# Patient Record
Sex: Female | Born: 1990
Health system: Southern US, Community
[De-identification: ages and names within clinical notes are randomized; demographics above are authoritative.]

## PROBLEM LIST (undated history)

## (undated) ENCOUNTER — Inpatient Hospital Stay: Payer: Self-pay

## (undated) DIAGNOSIS — J45909 Unspecified asthma, uncomplicated: Secondary | ICD-10-CM

## (undated) DIAGNOSIS — K811 Chronic cholecystitis: Secondary | ICD-10-CM

## (undated) DIAGNOSIS — Z8489 Family history of other specified conditions: Secondary | ICD-10-CM

## (undated) DIAGNOSIS — K219 Gastro-esophageal reflux disease without esophagitis: Secondary | ICD-10-CM

## (undated) DIAGNOSIS — E669 Obesity, unspecified: Secondary | ICD-10-CM

## (undated) DIAGNOSIS — B009 Herpesviral infection, unspecified: Secondary | ICD-10-CM

## (undated) DIAGNOSIS — O99345 Other mental disorders complicating the puerperium: Secondary | ICD-10-CM

## (undated) DIAGNOSIS — I10 Essential (primary) hypertension: Secondary | ICD-10-CM

## (undated) DIAGNOSIS — F53 Postpartum depression: Secondary | ICD-10-CM

## (undated) DIAGNOSIS — Z87442 Personal history of urinary calculi: Secondary | ICD-10-CM

## (undated) DIAGNOSIS — R011 Cardiac murmur, unspecified: Secondary | ICD-10-CM

## (undated) DIAGNOSIS — A6000 Herpesviral infection of urogenital system, unspecified: Secondary | ICD-10-CM

## (undated) DIAGNOSIS — F32A Depression, unspecified: Secondary | ICD-10-CM

## (undated) DIAGNOSIS — I499 Cardiac arrhythmia, unspecified: Secondary | ICD-10-CM

## (undated) DIAGNOSIS — K589 Irritable bowel syndrome without diarrhea: Secondary | ICD-10-CM

## (undated) DIAGNOSIS — F329 Major depressive disorder, single episode, unspecified: Secondary | ICD-10-CM

## (undated) DIAGNOSIS — E785 Hyperlipidemia, unspecified: Secondary | ICD-10-CM

## (undated) DIAGNOSIS — F419 Anxiety disorder, unspecified: Secondary | ICD-10-CM

## (undated) HISTORY — DX: Irritable bowel syndrome, unspecified: K58.9

## (undated) HISTORY — DX: Obesity, unspecified: E66.9

## (undated) HISTORY — DX: Depression, unspecified: F32.A

## (undated) HISTORY — PX: WISDOM TOOTH EXTRACTION: SHX21

## (undated) HISTORY — DX: Chronic cholecystitis: K81.1

## (undated) HISTORY — DX: Essential (primary) hypertension: I10

## (undated) HISTORY — DX: Unspecified asthma, uncomplicated: J45.909

## (undated) HISTORY — DX: Other mental disorders complicating the puerperium: O99.345

## (undated) HISTORY — DX: Postpartum depression: F53.0

## (undated) HISTORY — DX: Anxiety disorder, unspecified: F41.9

## (undated) HISTORY — DX: Cardiac murmur, unspecified: R01.1

## (undated) HISTORY — PX: OTHER SURGICAL HISTORY: SHX169

## (undated) HISTORY — DX: Herpesviral infection of urogenital system, unspecified: A60.00

## (undated) HISTORY — DX: Hyperlipidemia, unspecified: E78.5

## (undated) HISTORY — DX: Major depressive disorder, single episode, unspecified: F32.9

---

## 2014-12-21 DIAGNOSIS — O1410 Severe pre-eclampsia, unspecified trimester: Secondary | ICD-10-CM

## 2014-12-21 DIAGNOSIS — A6000 Herpesviral infection of urogenital system, unspecified: Secondary | ICD-10-CM

## 2016-07-08 ENCOUNTER — Ambulatory Visit: Payer: Self-pay | Admitting: Family Medicine

## 2016-09-26 ENCOUNTER — Telehealth: Payer: Self-pay | Admitting: Family Medicine

## 2016-09-26 NOTE — Telephone Encounter (Signed)
Pt husband Laban EmperorDarrell called and stated that they just found out that she is pregnant and had a couple of questions regarding her Celexa and whether she needs to stop this medication. Please advise, thank you!  Call pt @ [phone]

## 2016-09-26 NOTE — Telephone Encounter (Signed)
Legally Dr.Cook can not give medical advise because pt is not his pt. Pt will need to contact a OBGYN

## 2016-12-03 ENCOUNTER — Emergency Department
Admission: EM | Admit: 2016-12-03 | Discharge: 2016-12-03 | Disposition: A | Discharge status: Home / Self Care | Payer: BLUE CROSS/BLUE SHIELD | Attending: Emergency Medicine | Admitting: Emergency Medicine

## 2016-12-03 ENCOUNTER — Encounter: Payer: Self-pay | Admitting: Emergency Medicine

## 2016-12-03 DIAGNOSIS — O99511 Diseases of the respiratory system complicating pregnancy, first trimester: Secondary | ICD-10-CM | POA: Insufficient documentation

## 2016-12-03 DIAGNOSIS — Z3A13 13 weeks gestation of pregnancy: Secondary | ICD-10-CM | POA: Insufficient documentation

## 2016-12-03 DIAGNOSIS — J069 Acute upper respiratory infection, unspecified: Secondary | ICD-10-CM | POA: Insufficient documentation

## 2016-12-03 DIAGNOSIS — O210 Mild hyperemesis gravidarum: Secondary | ICD-10-CM | POA: Insufficient documentation

## 2016-12-03 DIAGNOSIS — O219 Vomiting of pregnancy, unspecified: Secondary | ICD-10-CM | POA: Diagnosis present

## 2016-12-03 HISTORY — DX: Herpesviral infection, unspecified: B00.9

## 2016-12-03 LAB — CBC WITH DIFFERENTIAL/PLATELET
Basophils Absolute: 0 10*3/uL (ref 0–0.1)
Basophils Relative: 0 %
EOS PCT: 0 %
Eosinophils Absolute: 0 10*3/uL (ref 0–0.7)
HEMATOCRIT: 38.3 % (ref 35.0–47.0)
Hemoglobin: 13.6 g/dL (ref 12.0–16.0)
Lymphocytes Relative: 30 %
Lymphs Abs: 2.6 10*3/uL (ref 1.0–3.6)
MCH: 32 pg (ref 26.0–34.0)
MCHC: 35.5 g/dL (ref 32.0–36.0)
MCV: 90.1 fL (ref 80.0–100.0)
MONO ABS: 0.4 10*3/uL (ref 0.2–0.9)
MONOS PCT: 5 %
NEUTROS ABS: 5.6 10*3/uL (ref 1.4–6.5)
Neutrophils Relative %: 65 %
Platelets: 345 10*3/uL (ref 150–440)
RBC: 4.25 MIL/uL (ref 3.80–5.20)
RDW: 13 % (ref 11.5–14.5)
WBC: 8.7 10*3/uL (ref 3.6–11.0)

## 2016-12-03 LAB — BASIC METABOLIC PANEL
Anion gap: 9 (ref 5–15)
BUN: 6 mg/dL (ref 6–20)
CO2: 22 mmol/L (ref 22–32)
Calcium: 8.9 mg/dL (ref 8.9–10.3)
Chloride: 102 mmol/L (ref 101–111)
Creatinine, Ser: 0.48 mg/dL (ref 0.44–1.00)
GFR calc Af Amer: >60 mL/min (ref >60)
GFR calc non Af Amer: >60 mL/min (ref >60)
Glucose, Bld: 97 mg/dL (ref 65–99)
Potassium: 3.2 mmol/L — ABNORMAL LOW (ref 3.5–5.1)
Sodium: 133 mmol/L — ABNORMAL LOW (ref 135–145)

## 2016-12-03 LAB — URINALYSIS, COMPLETE (UACMP) WITH MICROSCOPIC
BILIRUBIN URINE: NEGATIVE
GLUCOSE, UA: NEGATIVE mg/dL
HGB URINE DIPSTICK: NEGATIVE
KETONES UR: NEGATIVE mg/dL
LEUKOCYTES UA: NEGATIVE
NITRITE: NEGATIVE
PH: 9 — AB (ref 5.0–8.0)
Protein, ur: NEGATIVE mg/dL
Specific Gravity, Urine: 1.01 (ref 1.005–1.030)

## 2016-12-03 MED ORDER — METOCLOPRAMIDE HCL 5 MG/ML IJ SOLN
10.0000 mg | Freq: Once | INTRAMUSCULAR | Status: AC
  Administered 2016-12-03: 10 mg via INTRAVENOUS
  Filled 2016-12-03: qty 2

## 2016-12-03 MED ORDER — SODIUM CHLORIDE 0.9 % IV BOLUS (SEPSIS)
1000.0000 mL | Freq: Once | INTRAVENOUS | Status: AC
  Administered 2016-12-03: 1000 mL via INTRAVENOUS

## 2016-12-03 MED ORDER — ALBUTEROL SULFATE (2.5 MG/3ML) 0.083% IN NEBU
INHALATION_SOLUTION | RESPIRATORY_TRACT | Status: AC
  Administered 2016-12-03: 2.5 mg via RESPIRATORY_TRACT
  Filled 2016-12-03: qty 3

## 2016-12-03 MED ORDER — ALBUTEROL SULFATE (2.5 MG/3ML) 0.083% IN NEBU
2.5000 mg | INHALATION_SOLUTION | Freq: Once | RESPIRATORY_TRACT | Status: AC
  Administered 2016-12-03: 2.5 mg via RESPIRATORY_TRACT

## 2016-12-03 MED ORDER — GUAIFENESIN ER 600 MG PO TB12: 1200.0000 mg | ORAL_TABLET | Freq: Two times a day (BID) | ORAL | 0 refills | Status: DC

## 2016-12-03 MED ORDER — METOCLOPRAMIDE HCL 10 MG PO TABS: 10.0000 mg | ORAL_TABLET | Freq: Three times a day (TID) | ORAL | 0 refills | Status: DC

## 2016-12-03 MED ORDER — ONDANSETRON 4 MG PO TBDP
4.0000 mg | ORAL_TABLET | Freq: Once | ORAL | Status: AC
  Administered 2016-12-03: 4 mg via ORAL
  Filled 2016-12-03: qty 1

## 2016-12-03 MED ORDER — ALBUTEROL (5 MG/ML) CONTINUOUS INHALATION SOLN: 2.5000 mg/h | INHALATION_SOLUTION | Freq: Once | RESPIRATORY_TRACT | Status: DC

## 2016-12-03 NOTE — ED Notes (Signed)
Pt sitting up in room drinking ginger ale.  States nausea better.  Continued with headache.  NP aware.  Fluids infusing.

## 2016-12-03 NOTE — ED Notes (Signed)
Warm sheets to patient, resting in room, lights dimmed, call bell in reach

## 2016-12-03 NOTE — ED Notes (Signed)
Gave patient soft drink

## 2016-12-03 NOTE — ED Triage Notes (Signed)
Pt to ed with c/o cough and congestion x 3 weeks. Pt is approx [redacted] weeks pregnant.  OBGYN Dr Tiburcio PeaHarris.  Pt states she took a z pack about 2 weeks ago but no change in cough and congestion.

## 2016-12-03 NOTE — Discharge Instructions (Signed)
Follow-up with Dr. Tiburcio PeaHarris.  Return to the emergency department for symptoms that change or worsen if you are unable to schedule an appointment.

## 2016-12-03 NOTE — ED Provider Notes (Signed)
[facility] Emergency Department Provider Note  ____________________________________________  Time seen: Approximately 7:42 AM  I have reviewed the triage vital signs and the nursing notes.   HISTORY  Chief Complaint Cough   HPI Megan Kramer is a 25 y.o. female who presents to the emergency department for evaluation of cough and congestion. She states that she has had cough, congestion, and headache for the past 3 weeks. She also states that she has been nauseated since she found out she was pregnant. She states that this morning she has been vomiting. She was able to eat last night and did not vomit.She has taken Robitussin without relief of the cough.    Past Medical History:  Diagnosis Date  . Herpes     There are no active problems to display for this patient.   History reviewed. No pertinent surgical history.  Prior to Admission medications   Medication Sig Start Date End Date Taking? Authorizing Provider  guaiFENesin (MUCINEX) 600 MG 12 hr tablet Take 2 tablets (1,200 mg total) by mouth 2 (two) times daily. 12/03/16 12/03/17  Chinita Pesterari B Siddiq Kaluzny, FNP  metoCLOPramide (REGLAN) 10 MG tablet Take 1 tablet (10 mg total) by mouth 3 (three) times daily with meals. 12/03/16 12/03/17  Chinita Pesterari B Howell Groesbeck, FNP    Allergies Codeine  History reviewed. No pertinent family history.  Social History Social History  Substance Use Topics  . Smoking status: Never Smoker  . Smokeless tobacco: Never Used  . Alcohol use No    Review of Systems Constitutional: Negative for fever/chills ENT: Negative for sore throat. Cardiovascular: Denies chest pain. Respiratory: Negative for shortness of breath. Positive for cough. Gastrointestinal: Positive for nausea,  Positive for vomiting.  No diarrhea.  Musculoskeletal: Positive for body aches Skin: Negative for rash. Neurological: Positive for headaches ____________________________________________   PHYSICAL  EXAM:  VITAL SIGNS: ED Triage Vitals [12/03/16 0735]  Enc Vitals Group     BP (!) 143/83     Pulse Rate 86     Resp 20     Temp 98.3 F (36.8 C)     Temp Source Oral     SpO2 98 %     Weight 165 lb (74.8 kg)     Height 5\' 1"  (1.549 m)     Head Circumference      Peak Flow      Pain Score 7     Pain Loc      Pain Edu?      Excl. in GC?     Constitutional: Alert and oriented. Acutely ill appearing and in no acute distress. Eyes: Conjunctivae are normal. EOMI. Nose: Maxillary sinus congestion; clear rhinnorhea. Mouth/Throat: Mucous membranes are moist. Neck: No stridor.  Lymphatic: No cervical lymphadenopathy. Cardiovascular: Normal rate, regular rhythm. Grossly normal heart sounds.  Good peripheral circulation. Respiratory: Normal respiratory effort.  No retractions. Expiratory wheeze noted in the left lower lobe, otherwise clear. Gastrointestinal: Soft and nontender.  Musculoskeletal: FROM x 4 extremities.  Neurologic:  Normal speech and language.  Skin:  Skin is warm, dry and intact. No rash noted. Psychiatric: Mood and affect are normal. Speech and behavior are normal.  ____________________________________________   LABS (all labs ordered are listed, but only abnormal results are displayed)  Labs Reviewed  URINALYSIS, COMPLETE (UACMP) WITH MICROSCOPIC - Abnormal; Notable for the following:       Result Value   Color, Urine STRAW (*)    APPearance CLEAR (*)    pH 9.0 (*)  Bacteria, UA RARE (*)    Squamous Epithelial / LPF 0-5 (*)    All other components within normal limits  BASIC METABOLIC PANEL - Abnormal; Notable for the following:    Sodium 133 (*)    Potassium 3.2 (*)    All other components within normal limits  CBC WITH DIFFERENTIAL/PLATELET   ____________________________________________  EKG   ____________________________________________  RADIOLOGY   ____________________________________________   PROCEDURES  Procedure(s) performed:  None  Critical Care performed: No  ____________________________________________   INITIAL IMPRESSION / ASSESSMENT AND PLAN / ED COURSE  Clinical Course as of Dec 04 1043  Tue Dec 03, 2016  [id number] No improvement with ODT Zofran. Continues to complain of headache and nausea. Will order labs. No evidence of dehydration based on urinalysis results.  [CT]  1043 Nausea relieved by Reglan. Headache continues, but has improved. Patient able to tolerate po fluids without return of nausea or vomiting.  [CT]    Clinical Course User Index [CT] Chinita Pesterari B Ihsan Nomura, FNP    Pertinent labs & imaging results that were available during my care of the patient were reviewed by me and considered in my medical decision making (see chart for details).  Patient and husband verbalize concern for dehydration, however based on history, exam, and vital signs dehydration is unlikely. Urinalysis ordered for verification. Single dose of Zofran to be given in the emergency department as well as albuterol nebulized treatment. ____________________________________________   FINAL CLINICAL IMPRESSION(S) / ED DIAGNOSES  Final diagnoses:  Upper respiratory tract infection, unspecified type  Hyperemesis gravidarum, mild, before 23rd week    Note:  This document was prepared using Dragon voice recognition software and may include unintentional dictation errors.     Chinita PesterCari B Sagrario Lineberry, FNP 12/03/16 1232    Arnaldo NatalPaul F Malinda, MD 12/03/16 [phone]

## 2016-12-03 NOTE — ED Notes (Signed)
Pt informed to return if any life threatening symptoms occur.  

## 2016-12-30 ENCOUNTER — Emergency Department
Admission: EM | Admit: 2016-12-30 | Discharge: 2016-12-30 | Disposition: A | Discharge status: Home / Self Care | Payer: BLUE CROSS/BLUE SHIELD

## 2016-12-30 NOTE — ED Notes (Signed)
Patient states she called Dr. Tiburcio PeaHarris' office today to c/o one episode of bleeding and has not felt the baby moved in two days. Patient states she could not come to the office due to work, but was told to come to the ED and will then go upstairs to Labor and Delivery. Dr. Tiburcio PeaHarris was contacted by this writer and said that the patient could be seen at 1:15 tomorrow in the office or stay in the ED to be monitor. Patient will be informed.

## 2017-02-20 ENCOUNTER — Encounter: Payer: Self-pay | Admitting: Advanced Practice Midwife

## 2017-02-20 ENCOUNTER — Telehealth: Payer: Self-pay

## 2017-02-20 ENCOUNTER — Ambulatory Visit (INDEPENDENT_AMBULATORY_CARE_PROVIDER_SITE_OTHER): Payer: Managed Care, Other (non HMO) | Admitting: Advanced Practice Midwife

## 2017-02-20 VITALS — BP 134/76 | Wt 174.0 lb

## 2017-02-20 DIAGNOSIS — N390 Urinary tract infection, site not specified: Secondary | ICD-10-CM

## 2017-02-20 DIAGNOSIS — Z3482 Encounter for supervision of other normal pregnancy, second trimester: Secondary | ICD-10-CM

## 2017-02-20 DIAGNOSIS — O0990 Supervision of high risk pregnancy, unspecified, unspecified trimester: Secondary | ICD-10-CM | POA: Insufficient documentation

## 2017-02-20 LAB — POCT URINALYSIS DIPSTICK
Bilirubin, UA: NEGATIVE
Glucose, UA: NEGATIVE
Leukocytes, UA: NEGATIVE
NITRITE UA: NEGATIVE
PH UA: 7
SPEC GRAV UA: 1.02
UROBILINOGEN UA: NEGATIVE

## 2017-02-20 NOTE — Telephone Encounter (Signed)
Pt called stating she is 24wks preg.  Having strong menstrual cramps/pressure.  She is on her feet all at work.  She went home and rested and drank water.  It eased up and whent to sleep.  Sxs are back this morning.  Appt made for 1:50 c JEG.

## 2017-02-20 NOTE — Progress Notes (Signed)
Low stomach pain/ tightness/pressure x1 day.Feeling heavy chested

## 2017-02-20 NOTE — Progress Notes (Signed)
Pt has c/o stabbing pain in lower abdomen, central and spreading to sides, that began last night. She states that after she increased her hydration the pain improved and she was able to sleep. She also has c/o of pressure in her lower abdomen. She had some braxton hicks like contractions last night that lasted approximately 1 hour and improved after hydration. Large ketones in today's Urine dip. Recommend increase hydration throughout pregnancy. Other comfort measures reviewed. She has regular ob f/u visit scheduled for 28 weeks.

## 2017-03-14 ENCOUNTER — Ambulatory Visit (INDEPENDENT_AMBULATORY_CARE_PROVIDER_SITE_OTHER): Payer: Managed Care, Other (non HMO) | Admitting: Certified Nurse Midwife

## 2017-03-14 ENCOUNTER — Other Ambulatory Visit: Payer: Managed Care, Other (non HMO)

## 2017-03-14 ENCOUNTER — Encounter: Payer: Self-pay | Admitting: Certified Nurse Midwife

## 2017-03-14 ENCOUNTER — Telehealth: Payer: Self-pay | Admitting: Obstetrics & Gynecology

## 2017-03-14 VITALS — BP 122/68 | Wt 179.0 lb

## 2017-03-14 DIAGNOSIS — O0990 Supervision of high risk pregnancy, unspecified, unspecified trimester: Secondary | ICD-10-CM

## 2017-03-14 DIAGNOSIS — Z3A28 28 weeks gestation of pregnancy: Secondary | ICD-10-CM

## 2017-03-14 DIAGNOSIS — Z113 Encounter for screening for infections with a predominantly sexual mode of transmission: Secondary | ICD-10-CM

## 2017-03-14 DIAGNOSIS — O34219 Maternal care for unspecified type scar from previous cesarean delivery: Secondary | ICD-10-CM

## 2017-03-14 DIAGNOSIS — F53 Postpartum depression: Secondary | ICD-10-CM | POA: Insufficient documentation

## 2017-03-14 DIAGNOSIS — O99345 Other mental disorders complicating the puerperium: Secondary | ICD-10-CM

## 2017-03-14 DIAGNOSIS — F419 Anxiety disorder, unspecified: Secondary | ICD-10-CM | POA: Insufficient documentation

## 2017-03-14 DIAGNOSIS — Z98891 History of uterine scar from previous surgery: Secondary | ICD-10-CM | POA: Insufficient documentation

## 2017-03-14 DIAGNOSIS — A6000 Herpesviral infection of urogenital system, unspecified: Secondary | ICD-10-CM | POA: Insufficient documentation

## 2017-03-14 DIAGNOSIS — Z131 Encounter for screening for diabetes mellitus: Secondary | ICD-10-CM

## 2017-03-14 DIAGNOSIS — Z139 Encounter for screening, unspecified: Secondary | ICD-10-CM

## 2017-03-14 NOTE — Telephone Encounter (Signed)
-----   Message from Farrel Connersolleen Gutierrez, [place] sent at 03/14/2017  1:22 PM EDT ----- Regarding: scheduling surgery Surgery Booking Request Patient Full Name: Megan AbbeBrooke Renegar MRN: [id number]  DOB: Nov 18, 1991  Surgeon: Dr Velora Mediateobert Harris Requested Surgery Date and Time: 06/05/2017 Primary Diagnosis and Code: Previous Cesarean section Secondary Diagnosis and Code:  Surgical Procedure: Cesarean Section L&D Notification: Yes Admission Status: surgery admit Length of Surgery: 1 hour Special Case Needs: no H&P: to be scheduled Phone Interview or Office Pre-Admit: office Interpreter: no Language: no Medical Clearance: no Special Scheduling Instructions: no

## 2017-03-14 NOTE — Progress Notes (Signed)
Leaking when on feet a lot/28 week labs today

## 2017-03-14 NOTE — Progress Notes (Signed)
Baby active. Has been leaking urine at work (smells like ammonia). Discussed doing Kegel exercises. Cord blood donation discussed.  Baby boy/bottle Repeat CS scheduled for 6/14 with Dr Tiburcio Pea. 28 week labs today ROB in 2 weeks

## 2017-03-14 NOTE — Telephone Encounter (Signed)
Patient is aware of H&P on 06/03/17 @ 9:00am, Pre-Admit on 06/04/17, and OR on 06/05/17. Patient was given my extension.

## 2017-03-15 LAB — 28 WEEK RH+PANEL
BASOS ABS: 0 10*3/uL (ref 0.0–0.2)
Basos: 0 %
EOS (ABSOLUTE): 0 10*3/uL (ref 0.0–0.4)
EOS: 0 %
GESTATIONAL DIABETES SCREEN: 113 mg/dL (ref 65–139)
HIV Screen 4th Generation wRfx: NONREACTIVE
Hematocrit: 34.7 % (ref 34.0–46.6)
Hemoglobin: 12 g/dL (ref 11.1–15.9)
IMMATURE GRANS (ABS): 0 10*3/uL (ref 0.0–0.1)
IMMATURE GRANULOCYTES: 1 %
LYMPHS: 25 %
Lymphocytes Absolute: 1.8 10*3/uL (ref 0.7–3.1)
MCH: 30.7 pg (ref 26.6–33.0)
MCHC: 34.6 g/dL (ref 31.5–35.7)
MCV: 89 fL (ref 79–97)
Monocytes Absolute: 0.4 10*3/uL (ref 0.1–0.9)
Monocytes: 6 %
NEUTROS PCT: 68 %
Neutrophils Absolute: 5.1 10*3/uL (ref 1.4–7.0)
PLATELETS: 307 10*3/uL (ref 150–379)
RBC: 3.91 x10E6/uL (ref 3.77–5.28)
RDW: 13.5 % (ref 12.3–15.4)
RPR: NONREACTIVE
WBC: 7.3 10*3/uL (ref 3.4–10.8)

## 2017-03-23 ENCOUNTER — Encounter: Payer: Self-pay | Admitting: Certified Nurse Midwife

## 2017-03-28 ENCOUNTER — Ambulatory Visit (INDEPENDENT_AMBULATORY_CARE_PROVIDER_SITE_OTHER): Payer: Managed Care, Other (non HMO) | Admitting: Certified Nurse Midwife

## 2017-03-28 VITALS — BP 110/70 | Wt 177.0 lb

## 2017-03-28 DIAGNOSIS — Z3A30 30 weeks gestation of pregnancy: Secondary | ICD-10-CM

## 2017-03-28 DIAGNOSIS — O0990 Supervision of high risk pregnancy, unspecified, unspecified trimester: Secondary | ICD-10-CM

## 2017-03-28 NOTE — Progress Notes (Signed)
Out of TDAP.  Pt asked about the flu shot.  States she felt horrible after getting the TDAP/FLU at the same time last preg.  Not sure about getting the flu shot - not sure exactly what caused her reaction. Complains of episodes of regular contractions. Some increased discharge yesterdau. No vaginitis or UTI symptoms. Baby active. Cervix closed/ thick/ baby OOP 28 week labs WNL ROB in 2 weeks Preterm labor precautions

## 2017-04-08 ENCOUNTER — Telehealth: Payer: Self-pay | Admitting: Obstetrics and Gynecology

## 2017-04-08 ENCOUNTER — Observation Stay
Admission: EM | Admit: 2017-04-08 | Discharge: 2017-04-08 | Disposition: A | Discharge status: Home / Self Care | Payer: 59 | Attending: Obstetrics & Gynecology | Admitting: Obstetrics & Gynecology

## 2017-04-08 DIAGNOSIS — R109 Unspecified abdominal pain: Secondary | ICD-10-CM

## 2017-04-08 DIAGNOSIS — O0990 Supervision of high risk pregnancy, unspecified, unspecified trimester: Secondary | ICD-10-CM

## 2017-04-08 DIAGNOSIS — Z3A31 31 weeks gestation of pregnancy: Secondary | ICD-10-CM

## 2017-04-08 DIAGNOSIS — O26893 Other specified pregnancy related conditions, third trimester: Secondary | ICD-10-CM

## 2017-04-08 DIAGNOSIS — Z91040 Latex allergy status: Secondary | ICD-10-CM | POA: Diagnosis not present

## 2017-04-08 NOTE — OB Triage Note (Signed)
Pt G2P1 [redacted]w[redacted]d complains of pelvis pressure for a week. Pt states that it is painful and feels like stretching/pressure. Pt states she has had cramping since yesterday. Pt states pain for cramping is 8/10. Pt states she had a sharp pain in the R side of her lower abdomen yesterday and the pain radiated towards the left lower quadrant of her abdomen. States that cramping started after this pain. Pt states + FM and denies gush of fluid. Pt states she leaks every now and then. Monitors applied and assessing. VSS.

## 2017-04-08 NOTE — Telephone Encounter (Signed)
Pt is calling wanting to be seen today. Pt report last night she started vomiting and was having a stabbing pain in her abdomen with the sharpness going through to back side. Pt reports cramping and slight leaking but not enough to soak underwear. Please advise.

## 2017-04-08 NOTE — Discharge Summary (Signed)
Physician Final Progress Note  Patient ID: Megan Kramer MRN: [id number] DOB/AGE: 07-12-1991 26 y.o.  Admit date: 04/08/2017 Admitting provider: Tresea Mall, CNM Discharge date: 04/08/2017   Admission Diagnoses: lower abdominal pain/cramping, leakage of fluid  Discharge Diagnoses:  Active Problems:   Indication for care in labor and delivery, antepartum IUP at [redacted]w[redacted]d with reactive NST, not in labor, membranes intact, positive discomforts of pregnancy  History of Present Illness: The patient is a 26 y.o. female G2P1001 at [redacted]w[redacted]d who presents for lower abdominal pain and cramping since last night. The patient states the cramps feel like a period. She denies intermittent nature of cramps or vaginal bleeding. The patient states that she has felt small amounts of fluid leaking since yesterday. She admits positive fetal movement.    Past Medical History:  Diagnosis Date  . Anxiety   . Herpes   . Hypertension    pregnancy induced  . Obesity (BMI 30-39.9)   . Postpartum depression associated with first pregnancy   . Second pregnancy     Past Surgical History:  Procedure Laterality Date  . CESAREAN SECTION  12/21/2014   [place]    No current facility-administered medications on file prior to encounter.    Current Outpatient Prescriptions on File Prior to Encounter  Medication Sig Dispense Refill  . Prenatal Vit-Fe Fumarate-FA (PRENATAL MULTIVITAMIN) TABS tablet Take 1 tablet by mouth daily at 12 noon.    . Doxylamine-Pyridoxine (DICLEGIS) 10-10 MG TBEC Take 10 tablets by mouth 3 (three) times daily. Take one tablet by oral route once daily in the morning, one tablet in the mid-afternoon, and two tablets at bedtime.    Marland Kitchen guaiFENesin (MUCINEX) 600 MG 12 hr tablet Take 2 tablets (1,200 mg total) by mouth 2 (two) times daily. (Patient not taking: Reported on 04/08/2017) 60 tablet 0  . hydrOXYzine (VISTARIL) 25 MG capsule Take 25 mg by mouth 3 (three) times daily as needed.    .  metoCLOPramide (REGLAN) 10 MG tablet Take 1 tablet (10 mg total) by mouth 3 (three) times daily with meals. (Patient not taking: Reported on 04/08/2017) 30 tablet 0    Allergies  Allergen Reactions  . Codeine Nausea Only  . Latex Rash    Social History   Social History  . Marital status: Married    Spouse name: N/A  . Number of children: N/A  . Years of education: N/A   Occupational History  . Not on file.   Social History Main Topics  . Smoking status: Never Smoker  . Smokeless tobacco: Never Used  . Alcohol use No  . Drug use: No  . Sexual activity: Yes    Birth control/ protection: None   Other Topics Concern  . Not on file   Social History Narrative  . No narrative on file    Physical Exam: BP 126/74 (BP Location: Right Arm)   Pulse 99   Temp 98 F (36.7 C) (Oral)   Resp 16   Ht  (1.549 m)   Wt 177 lb (80.3 kg)   BMI 33.44 kg/m   Gen: NAD CV: RRR Pulm: CTAB Pelvic: speculum exam cervix is closed Toco: negative Fetal well being: 135 bpm, moderate variability, +accels, -decels Membranes: nitrazine negative, pooling negative, ferning negative Ext: no swelling, no evidence of DVT  Consults: None  Significant Findings/ Diagnostic Studies: labs: none  Procedures: NST  Discharge Condition: good  Disposition: 01-Home or Self Care  Diet: Regular diet  Discharge Activity: Activity as  tolerated  Discharge Instructions    Discharge activity:    Complete by:  As directed    Out of work and increased rest for the remainder of this week with reevaluation of when to be out of work prior to delivery. Out of work note given today   Discharge diet:  No restrictions    Complete by:  As directed    Fetal Kick Count:  Lie on our left side for one hour after a meal, and count the number of times your baby kicks.  If it is less than 5 times, get up, move around and drink some juice.  Repeat the test 30 minutes later.  If it is still less than 5 kicks in an  hour, notify your doctor.    Complete by:  As directed    No sexual activity restrictions    Complete by:  As directed    Notify physician for a general feeling that "something is not right"    Complete by:  As directed    Notify physician for increase or change in vaginal discharge    Complete by:  As directed    Notify physician for intestinal cramps, with or without diarrhea, sometimes described as "gas pain"    Complete by:  As directed    Notify physician for leaking of fluid    Complete by:  As directed    Notify physician for low, dull backache, unrelieved by heat or Tylenol    Complete by:  As directed    Notify physician for menstrual like cramps    Complete by:  As directed    Notify physician for pelvic pressure    Complete by:  As directed    Notify physician for uterine contractions.  These may be painless and feel like the uterus is tightening or the baby is  "balling up"    Complete by:  As directed    Notify physician for vaginal bleeding    Complete by:  As directed    PRETERM LABOR:  Includes any of the follwing symptoms that occur between 20 - [redacted] weeks gestation.  If these symptoms are not stopped, preterm labor can result in preterm delivery, placing your baby at risk    Complete by:  As directed      Allergies as of 04/08/2017      Reactions   Codeine Nausea Only   Latex Rash      Medication List    STOP taking these medications   guaiFENesin 600 MG 12 hr tablet Commonly known as:  MUCINEX   metoCLOPramide 10 MG tablet Commonly known as:  REGLAN     TAKE these medications   DICLEGIS 10-10 MG Tbec Generic drug:  Doxylamine-Pyridoxine Take 10 tablets by mouth 3 (three) times daily. Take one tablet by oral route once daily in the morning, one tablet in the mid-afternoon, and two tablets at bedtime.   hydrOXYzine 25 MG capsule Commonly known as:  VISTARIL Take 25 mg by mouth 3 (three) times daily as needed.   prenatal multivitamin Tabs tablet Take 1  tablet by mouth daily at 12 noon.      Follow-up Information    [facility] Follow up.   Why:  go to regular scheduled prenatal appointment Contact information: [address] [place] [address] [phone]          Total time spent taking care of this patient: 25 minutes  Signed: Tresea Mall, CNM  04/08/2017, 12:05  PM

## 2017-04-08 NOTE — Telephone Encounter (Signed)
Pt also left msg on triage line. Pt reports "violently" getting sick last night then experiencing a sharp pain in her pelvic area around to her back and cramping. Pt also reports baby's movement has decreased. Checked with front desk and no availability on schedule today. Pt was currently at work but plans to go to L&D for monitoring.

## 2017-04-10 ENCOUNTER — Observation Stay
Admission: EM | Admit: 2017-04-10 | Discharge: 2017-04-10 | Disposition: A | Discharge status: Home / Self Care | Payer: 59 | Attending: Certified Nurse Midwife | Admitting: Certified Nurse Midwife

## 2017-04-10 ENCOUNTER — Telehealth: Payer: Self-pay

## 2017-04-10 DIAGNOSIS — Z3A31 31 weeks gestation of pregnancy: Secondary | ICD-10-CM | POA: Diagnosis not present

## 2017-04-10 DIAGNOSIS — O26893 Other specified pregnancy related conditions, third trimester: Principal | ICD-10-CM | POA: Insufficient documentation

## 2017-04-10 DIAGNOSIS — R109 Unspecified abdominal pain: Secondary | ICD-10-CM | POA: Diagnosis not present

## 2017-04-10 DIAGNOSIS — O26899 Other specified pregnancy related conditions, unspecified trimester: Secondary | ICD-10-CM | POA: Diagnosis present

## 2017-04-10 DIAGNOSIS — O0990 Supervision of high risk pregnancy, unspecified, unspecified trimester: Secondary | ICD-10-CM

## 2017-04-10 LAB — URINALYSIS, COMPLETE (UACMP) WITH MICROSCOPIC
Bilirubin Urine: NEGATIVE
Glucose, UA: NEGATIVE mg/dL
Ketones, ur: NEGATIVE mg/dL
Leukocytes, UA: NEGATIVE
Nitrite: NEGATIVE
Protein, ur: NEGATIVE mg/dL
Specific Gravity, Urine: 1.005 (ref 1.005–1.030)
pH: 7 (ref 5.0–8.0)

## 2017-04-10 MED ORDER — ACETAMINOPHEN 500 MG PO TABS
1000.0000 mg | ORAL_TABLET | Freq: Four times a day (QID) | ORAL | Status: DC | PRN
  Administered 2017-04-10: 1000 mg via ORAL
  Filled 2017-04-10: qty 2

## 2017-04-10 NOTE — OB Triage Note (Signed)
Patient presents to Labor and Delivery with complaints of lower abdominal cramping.  Patient denies vaginal bleeding, leaking fluid, or decreased fetal movement. EFM applied and explained Plan to monitor fetal and maternal well-being.

## 2017-04-10 NOTE — Telephone Encounter (Signed)
Pt calling.  She was seen in L&D Tues for cramping and was taken out of work for the rest of the week.  Today the cramping is much worse so much so that she is curled up on bed.  She has been resting since Tues, drinking water, trying to walk, took a hot bath - nothing is helping.  It is worse.  Adv pt to go back to L&D for possible monitoring to be sure she isn't having ctxs.  Pt aware to enter via ED.  Lori at L&D notified.

## 2017-04-10 NOTE — Final Progress Note (Signed)
Physician Final Progress Note  Patient ID: Megan Kramer MRN: [id number] DOB/AGE: 03/21/1991 26 y.o.  Admit date: 04/10/2017 Admitting provider: Vena Austria, MD Discharge date: 04/10/2017   Admission Diagnoses: Abdominal pain third trimester  Discharge Diagnoses:  Active Problems:   Abdominal cramping complicating pregnancy   Consults: None  Significant Findings/ Diagnostic Studies: Results for orders placed or performed during the hospital encounter of 04/10/17 (from the past 24 hour(s))  Urinalysis, Complete w Microscopic     Status: Abnormal   Collection Time: 04/10/17 12:40 PM  Result Value Ref Range   Color, Urine STRAW (A) YELLOW   APPearance CLEAR (A) CLEAR   Specific Gravity, Urine 1.005 1.005 - 1.030   pH 7.0 5.0 - 8.0   Glucose, UA NEGATIVE NEGATIVE mg/dL   Hgb urine dipstick MODERATE (A) NEGATIVE   Bilirubin Urine NEGATIVE NEGATIVE   Ketones, ur NEGATIVE NEGATIVE mg/dL   Protein, ur NEGATIVE NEGATIVE mg/dL   Nitrite NEGATIVE NEGATIVE   Leukocytes, UA NEGATIVE NEGATIVE   RBC / HPF 6-30 0 - 5 RBC/hpf   WBC, UA 0-5 0 - 5 WBC/hpf   Bacteria, UA FEW (A) NONE SEEN   Squamous Epithelial / LPF 0-5 (A) NONE SEEN   Mucous PRESENT     Procedures: NST Baseline: 125 Variability: moderate Accelerations: present Decelerations: absent Tocometry: none The patient was monitored for 30 minutes, fetal heart rate tracing was deemed reactive, category I tracing,   Discharge Condition: good  Disposition: 01-Home or Self Care  Diet: Regular diet  Discharge Activity: Bedrest  Discharge Instructions    Discharge activity:  No Restrictions    Complete by:  As directed    Discharge diet:  No restrictions    Complete by:  As directed    No sexual activity restrictions    Complete by:  As directed    Notify physician for a general feeling that "something is not right"    Complete by:  As directed    Notify physician for increase or change in vaginal discharge     Complete by:  As directed    Notify physician for intestinal cramps, with or without diarrhea, sometimes described as "gas pain"    Complete by:  As directed    Notify physician for leaking of fluid    Complete by:  As directed    Notify physician for low, dull backache, unrelieved by heat or Tylenol    Complete by:  As directed    Notify physician for menstrual like cramps    Complete by:  As directed    Notify physician for pelvic pressure    Complete by:  As directed    Notify physician for uterine contractions.  These may be painless and feel like the uterus is tightening or the baby is  "balling up"    Complete by:  As directed    Notify physician for vaginal bleeding    Complete by:  As directed    PRETERM LABOR:  Includes any of the follwing symptoms that occur between 20 - [redacted] weeks gestation.  If these symptoms are not stopped, preterm labor can result in preterm delivery, placing your baby at risk    Complete by:  As directed      Allergies as of 04/10/2017      Reactions   Codeine Nausea Only   Latex Rash      Medication List    TAKE these medications   DICLEGIS 10-10 MG Tbec Generic drug:  Doxylamine-Pyridoxine Take  10 tablets by mouth 3 (three) times daily. Take one tablet by oral route once daily in the morning, one tablet in the mid-afternoon, and two tablets at bedtime.   hydrOXYzine 25 MG capsule Commonly known as:  VISTARIL Take 25 mg by mouth 3 (three) times daily as needed.   prenatal multivitamin Tabs tablet Take 1 tablet by mouth daily at 12 noon.        Total time spent taking care of this patient: 20 minutes  Signed: Vena Austria 04/10/2017, 2:43 PM

## 2017-04-11 ENCOUNTER — Ambulatory Visit (INDEPENDENT_AMBULATORY_CARE_PROVIDER_SITE_OTHER): Payer: Managed Care, Other (non HMO) | Admitting: Advanced Practice Midwife

## 2017-04-11 VITALS — BP 132/74 | Wt 181.0 lb

## 2017-04-11 DIAGNOSIS — M549 Dorsalgia, unspecified: Secondary | ICD-10-CM

## 2017-04-11 DIAGNOSIS — Z3A32 32 weeks gestation of pregnancy: Secondary | ICD-10-CM

## 2017-04-11 DIAGNOSIS — O99891 Other specified diseases and conditions complicating pregnancy: Secondary | ICD-10-CM

## 2017-04-11 DIAGNOSIS — O9989 Other specified diseases and conditions complicating pregnancy, childbirth and the puerperium: Secondary | ICD-10-CM

## 2017-04-11 DIAGNOSIS — O26893 Other specified pregnancy related conditions, third trimester: Secondary | ICD-10-CM

## 2017-04-11 MED ORDER — CYCLOBENZAPRINE HCL 10 MG PO TABS: 10.0000 mg | ORAL_TABLET | Freq: Three times a day (TID) | ORAL | 2 refills | Status: DC | PRN

## 2017-04-11 NOTE — Progress Notes (Signed)
Hip area pain/cramping/spotting on toilet tissue

## 2017-04-11 NOTE — Progress Notes (Signed)
Pain has been increasing in back and hips since yesterday, some cramping and spotting following recent cervical exams. Cervix was closed yesterday at hospital. Recommendations given for rest, stretching, wearing abdominal support binder, hydrotherapy. Rx given for flexeril due to back pain. Work note given for out of work beginning now. May reevaluate after wearing abdominal support and other comfort measures. Pt also requested print out of prenatal labs for Hep B result. Baby breech on u/s yesterday and continues in breech today.

## 2017-04-11 NOTE — Patient Instructions (Signed)
Third Trimester of Pregnancy The third trimester is from week 28 through week 40 (months 7 through 9). The third trimester is a time when the unborn baby (fetus) is growing rapidly. At the end of the ninth month, the fetus is about 20 inches in length and weighs 6-10 pounds. Body changes during your third trimester Your body will continue to go through many changes during pregnancy. The changes vary from woman to woman. During the third trimester:  Your weight will continue to increase. You can expect to gain 25-35 pounds (11-16 kg) by the end of the pregnancy.  You may begin to get stretch marks on your hips, abdomen, and breasts.  You may urinate more often because the fetus is moving lower into your pelvis and pressing on your bladder.  You may develop or continue to have heartburn. This is caused by increased hormones that slow down muscles in the digestive tract.  You may develop or continue to have constipation because increased hormones slow digestion and cause the muscles that push waste through your intestines to relax.  You may develop hemorrhoids. These are swollen veins (varicose veins) in the rectum that can itch or be painful.  You may develop swollen, bulging veins (varicose veins) in your legs.  You may have increased body aches in the pelvis, back, or thighs. This is due to weight gain and increased hormones that are relaxing your joints.  You may have changes in your hair. These can include thickening of your hair, rapid growth, and changes in texture. Some women also have hair loss during or after pregnancy, or hair that feels dry or thin. Your hair will most likely return to normal after your baby is born.  Your breasts will continue to grow and they will continue to become tender. A yellow fluid (colostrum) may leak from your breasts. This is the first milk you are producing for your baby.  Your belly button may stick out.  You may notice more swelling in your hands,  face, or ankles.  You may have increased tingling or numbness in your hands, arms, and legs. The skin on your belly may also feel numb.  You may feel short of breath because of your expanding uterus.  You may have more problems sleeping. This can be caused by the size of your belly, increased need to urinate, and an increase in your body's metabolism.  You may notice the fetus "dropping," or moving lower in your abdomen (lightening).  You may have increased vaginal discharge.  You may notice your joints feel loose and you may have pain around your pelvic bone.  What to expect at prenatal visits You will have prenatal exams every 2 weeks until week 36. Then you will have weekly prenatal exams. During a routine prenatal visit:  You will be weighed to make sure you and the baby are growing normally.  Your blood pressure will be taken.  Your abdomen will be measured to track your baby's growth.  The fetal heartbeat will be listened to.  Any test results from the previous visit will be discussed.  You may have a cervical check near your due date to see if your cervix has softened or thinned (effaced).  You will be tested for Group B streptococcus. This happens between 35 and 37 weeks.  Your health care provider may ask you:  What your birth plan is.  How you are feeling.  If you are feeling the baby move.  If you have had   any abnormal symptoms, such as leaking fluid, bleeding, severe headaches, or abdominal cramping.  If you are using any tobacco products, including cigarettes, chewing tobacco, and electronic cigarettes.  If you have any questions.  Other tests or screenings that may be performed during your third trimester include:  Blood tests that check for low iron levels (anemia).  Fetal testing to check the health, activity level, and growth of the fetus. Testing is done if you have certain medical conditions or if there are problems during the  pregnancy.  Nonstress test (NST). This test checks the health of your baby to make sure there are no signs of problems, such as the baby not getting enough oxygen. During this test, a belt is placed around your belly. The baby is made to move, and its heart rate is monitored during movement.  What is false labor? False labor is a condition in which you feel small, irregular tightenings of the muscles in the womb (contractions) that usually go away with rest, changing position, or drinking water. These are called Braxton Hicks contractions. Contractions may last for hours, days, or even weeks before true labor sets in. If contractions come at regular intervals, become more frequent, increase in intensity, or become painful, you should see your health care provider. What are the signs of labor?  Abdominal cramps.  Regular contractions that start at 10 minutes apart and become stronger and more frequent with time.  Contractions that start on the top of the uterus and spread down to the lower abdomen and back.  Increased pelvic pressure and dull back pain.  A watery or bloody mucus discharge that comes from the vagina.  Leaking of amniotic fluid. This is also known as your "water breaking." It could be a slow trickle or a gush. Let your health care provider know if it has a color or strange odor. If you have any of these signs, call your health care provider right away, even if it is before your due date. Follow these instructions at home: Medicines  Follow your health care provider's instructions regarding medicine use. Specific medicines may be either safe or unsafe to take during pregnancy.  Take a prenatal vitamin that contains at least 600 micrograms (mcg) of folic acid.  If you develop constipation, try taking a stool softener if your health care provider approves. Eating and drinking  Eat a balanced diet that includes fresh fruits and vegetables, whole grains, good sources of protein  such as meat, eggs, or tofu, and low-fat dairy. Your health care provider will help you determine the amount of weight gain that is right for you.  Avoid raw meat and uncooked cheese. These carry germs that can cause birth defects in the baby.  If you have low calcium intake from food, talk to your health care provider about whether you should take a daily calcium supplement.  Eat four or five small meals rather than three large meals a day.  Limit foods that are high in fat and processed sugars, such as fried and sweet foods.  To prevent constipation: ? Drink enough fluid to keep your urine clear or pale yellow. ? Eat foods that are high in fiber, such as fresh fruits and vegetables, whole grains, and beans. Activity  Exercise only as directed by your health care provider. Most women can continue their usual exercise routine during pregnancy. Try to exercise for 30 minutes at least 5 days a week. Stop exercising if you experience uterine contractions.  Avoid heavy   lifting.  Do not exercise in extreme heat or humidity, or at high altitudes.  Wear low-heel, comfortable shoes.  Practice good posture.  You may continue to have sex unless your health care provider tells you otherwise. Relieving pain and discomfort  Take frequent breaks and rest with your legs elevated if you have leg cramps or low back pain.  Take warm sitz baths to soothe any pain or discomfort caused by hemorrhoids. Use hemorrhoid cream if your health care provider approves.  Wear a good support bra to prevent discomfort from breast tenderness.  If you develop varicose veins: ? Wear support pantyhose or compression stockings as told by your healthcare provider. ? Elevate your feet for 15 minutes, 3-4 times a day. Prenatal care  Write down your questions. Take them to your prenatal visits.  Keep all your prenatal visits as told by your health care provider. This is important. Safety  Wear your seat belt at  all times when driving.  Make a list of emergency phone numbers, including numbers for family, friends, the hospital, and police and fire departments. General instructions  Avoid cat litter boxes and soil used by cats. These carry germs that can cause birth defects in the baby. If you have a cat, ask someone to clean the litter box for you.  Do not travel far distances unless it is absolutely necessary and only with the approval of your health care provider.  Do not use hot tubs, steam rooms, or saunas.  Do not drink alcohol.  Do not use any products that contain nicotine or tobacco, such as cigarettes and e-cigarettes. If you need help quitting, ask your health care provider.  Do not use any medicinal herbs or unprescribed drugs. These chemicals affect the formation and growth of the baby.  Do not douche or use tampons or scented sanitary pads.  Do not cross your legs for long periods of time.  To prepare for the arrival of your baby: ? Take prenatal classes to understand, practice, and ask questions about labor and delivery. ? Make a trial run to the hospital. ? Visit the hospital and tour the maternity area. ? Arrange for maternity or paternity leave through employers. ? Arrange for family and friends to take care of pets while you are in the hospital. ? Purchase a rear-facing car seat and make sure you know how to install it in your car. ? Pack your hospital bag. ? Prepare the baby's nursery. Make sure to remove all pillows and stuffed animals from the baby's crib to prevent suffocation.  Visit your dentist if you have not gone during your pregnancy. Use a soft toothbrush to brush your teeth and be gentle when you floss. Contact a health care provider if:  You are unsure if you are in labor or if your water has broken.  You become dizzy.  You have mild pelvic cramps, pelvic pressure, or nagging pain in your abdominal area.  You have lower back pain.  You have persistent  nausea, vomiting, or diarrhea.  You have an unusual or bad smelling vaginal discharge.  You have pain when you urinate. Get help right away if:  Your water breaks before 37 weeks.  You have regular contractions less than 5 minutes apart before 37 weeks.  You have a fever.  You are leaking fluid from your vagina.  You have spotting or bleeding from your vagina.  You have severe abdominal pain or cramping.  You have rapid weight loss or weight gain.    You have shortness of breath with chest pain.  You notice sudden or extreme swelling of your face, hands, ankles, feet, or legs.  Your baby makes fewer than 10 movements in 2 hours.  You have severe headaches that do not go away when you take medicine.  You have vision changes. Summary  The third trimester is from week 28 through week 40, months 7 through 9. The third trimester is a time when the unborn baby (fetus) is growing rapidly.  During the third trimester, your discomfort may increase as you and your baby continue to gain weight. You may have abdominal, leg, and back pain, sleeping problems, and an increased need to urinate.  During the third trimester your breasts will keep growing and they will continue to become tender. A yellow fluid (colostrum) may leak from your breasts. This is the first milk you are producing for your baby.  False labor is a condition in which you feel small, irregular tightenings of the muscles in the womb (contractions) that eventually go away. These are called Braxton Hicks contractions. Contractions may last for hours, days, or even weeks before true labor sets in.  Signs of labor can include: abdominal cramps; regular contractions that start at 10 minutes apart and become stronger and more frequent with time; watery or bloody mucus discharge that comes from the vagina; increased pelvic pressure and dull back pain; and leaking of amniotic fluid. This information is not intended to replace advice  given to you by your health care provider. Make sure you discuss any questions you have with your health care provider. Document Released: 12/03/2001 Document Revised: 05/16/2016 Document Reviewed: 02/09/2013 Elsevier Interactive Patient Education  2017 [employer]  

## 2017-04-15 ENCOUNTER — Observation Stay: Payer: 59

## 2017-04-15 ENCOUNTER — Telehealth: Payer: Self-pay

## 2017-04-15 ENCOUNTER — Observation Stay
Admission: EM | Admit: 2017-04-15 | Discharge: 2017-04-15 | Disposition: A | Discharge status: Home / Self Care | Payer: 59 | Attending: Obstetrics and Gynecology | Admitting: Obstetrics and Gynecology

## 2017-04-15 DIAGNOSIS — Z3A32 32 weeks gestation of pregnancy: Secondary | ICD-10-CM | POA: Insufficient documentation

## 2017-04-15 DIAGNOSIS — O9989 Other specified diseases and conditions complicating pregnancy, childbirth and the puerperium: Principal | ICD-10-CM | POA: Insufficient documentation

## 2017-04-15 DIAGNOSIS — Z91040 Latex allergy status: Secondary | ICD-10-CM | POA: Insufficient documentation

## 2017-04-15 DIAGNOSIS — Z885 Allergy status to narcotic agent status: Secondary | ICD-10-CM | POA: Insufficient documentation

## 2017-04-15 DIAGNOSIS — O139 Gestational [pregnancy-induced] hypertension without significant proteinuria, unspecified trimester: Secondary | ICD-10-CM | POA: Diagnosis not present

## 2017-04-15 DIAGNOSIS — Z79899 Other long term (current) drug therapy: Secondary | ICD-10-CM | POA: Insufficient documentation

## 2017-04-15 DIAGNOSIS — R109 Unspecified abdominal pain: Secondary | ICD-10-CM

## 2017-04-15 DIAGNOSIS — R1032 Left lower quadrant pain: Secondary | ICD-10-CM | POA: Diagnosis not present

## 2017-04-15 DIAGNOSIS — O0990 Supervision of high risk pregnancy, unspecified, unspecified trimester: Secondary | ICD-10-CM

## 2017-04-15 DIAGNOSIS — N132 Hydronephrosis with renal and ureteral calculous obstruction: Secondary | ICD-10-CM | POA: Insufficient documentation

## 2017-04-15 LAB — COMPREHENSIVE METABOLIC PANEL
ALT: 14 U/L (ref 14–54)
AST: 23 U/L (ref 15–41)
Albumin: 3 g/dL — ABNORMAL LOW (ref 3.5–5.0)
Alkaline Phosphatase: 137 U/L — ABNORMAL HIGH (ref 38–126)
Anion gap: 9 (ref 5–15)
BUN: 6 mg/dL (ref 6–20)
CHLORIDE: 106 mmol/L (ref 101–111)
CO2: 23 mmol/L (ref 22–32)
CREATININE: 0.54 mg/dL (ref 0.44–1.00)
Calcium: 9.4 mg/dL (ref 8.9–10.3)
GFR calc non Af Amer: >60 mL/min (ref >60)
Glucose, Bld: 74 mg/dL (ref 65–99)
POTASSIUM: 3.9 mmol/L (ref 3.5–5.1)
SODIUM: 138 mmol/L (ref 135–145)
Total Bilirubin: 0.6 mg/dL (ref 0.3–1.2)
Total Protein: 6.9 g/dL (ref 6.5–8.1)

## 2017-04-15 LAB — URINALYSIS, COMPLETE (UACMP) WITH MICROSCOPIC
BILIRUBIN URINE: NEGATIVE
Glucose, UA: NEGATIVE mg/dL
Hgb urine dipstick: NEGATIVE
KETONES UR: NEGATIVE mg/dL
LEUKOCYTES UA: NEGATIVE
Nitrite: NEGATIVE
Protein, ur: NEGATIVE mg/dL
Specific Gravity, Urine: 1.017 (ref 1.005–1.030)
pH: 6 (ref 5.0–8.0)

## 2017-04-15 LAB — CBC
HCT: 35.3 % (ref 35.0–47.0)
Hemoglobin: 12.3 g/dL (ref 12.0–16.0)
MCH: 30.8 pg (ref 26.0–34.0)
MCHC: 34.8 g/dL (ref 32.0–36.0)
MCV: 88.5 fL (ref 80.0–100.0)
PLATELETS: 296 10*3/uL (ref 150–440)
RBC: 3.99 MIL/uL (ref 3.80–5.20)
RDW: 13.8 % (ref 11.5–14.5)
WBC: 8.3 10*3/uL (ref 3.6–11.0)

## 2017-04-15 LAB — PROTEIN / CREATININE RATIO, URINE
Creatinine, Urine: 151 mg/dL
PROTEIN CREATININE RATIO: 0.11 mg/mg{creat} (ref 0.00–0.15)
TOTAL PROTEIN, URINE: 17 mg/dL

## 2017-04-15 LAB — LACTIC ACID, PLASMA: LACTIC ACID, VENOUS: 0.9 mmol/L (ref 0.5–1.9)

## 2017-04-15 LAB — LIPASE, BLOOD: LIPASE: 23 U/L (ref 11–51)

## 2017-04-15 NOTE — Discharge Summary (Signed)
Physician Discharge Summary  Patient ID: Megan Kramer MRN: [id number] DOB/AGE: 19-Dec-1991 26 y.o.  Admit date: 04/15/2017 Admitting provider: Conard Novak, MD Discharge date: 04/15/2017   Admission Diagnoses:  1) intrauterine pregnancy at [redacted]w[redacted]d 2) Left abdominal pain  Discharge Diagnoses:  1) intrauterine pregnancy at [redacted]w[redacted]d 2) Left abdominal pain 3) left hydronephrosis, left hydroureter (moderate)  History of Present Illness: The patient is a 26 y.o. female G2P1001 at [redacted]w[redacted]d who presents for left flank and back pain since this morning.  She H&P for details.  Hospital Course: The patient was admitted for the above.  She had feta assessment with fetal monitoring which was reassuring.  She had lab work to assess the source of her pain.  A renal ultrasound showed left hydronephrosis and left hydroureter without an obvious stone noted.  Urology consult was obtained to offer treatment options.  Patient will have expectant management for now.  She was offered percutaneous nephrostomy, if pain worsens or hydronephrosis/hydroureter worsens.  She had some contractions on the monitor her, which were described by the patient as cramps.  She has had these off and on for a couple weeks now with no cervical dilation noted on her visit last week. She declined a cervical check today.  Patient discharged with precautions given directly by Urologist, especially fever and worsening pain.  She is to report fever ASAP, should this occur.  Her vitals started with elevated BP, but they normalized after she was here for a while.  Baseline preeclampsia labs obtained were normal.  Will continue to monitor this at her clinic visits.  Precautions for preeclampsia also given. Patient discharged home in stable condition.    Past Medical History:  Diagnosis Date  . Anxiety   . Herpes   . Hypertension    pregnancy induced  . Obesity (BMI 30-39.9)   . Postpartum depression associated with first pregnancy   .  Second pregnancy     Past Surgical History:  Procedure Laterality Date  . CESAREAN SECTION  12/21/2014   [place]    No current facility-administered medications on file prior to encounter.    Current Outpatient Prescriptions on File Prior to Encounter  Medication Sig Dispense Refill  . cyclobenzaprine (FLEXERIL) 10 MG tablet Take 1 tablet (10 mg total) by mouth 3 (three) times daily as needed for muscle spasms. 30 tablet 2  . Prenatal Vit-Fe Fumarate-FA (PRENATAL MULTIVITAMIN) TABS tablet Take 1 tablet by mouth daily at 12 noon.    . Doxylamine-Pyridoxine (DICLEGIS) 10-10 MG TBEC Take 10 tablets by mouth 3 (three) times daily. Take one tablet by oral route once daily in the morning, one tablet in the mid-afternoon, and two tablets at bedtime.    . hydrOXYzine (VISTARIL) 25 MG capsule Take 25 mg by mouth 3 (three) times daily as needed.      Allergies  Allergen Reactions  . Codeine Nausea Only  . Latex Rash    Social History   Social History  . Marital status: Married    Spouse name: N/A  . Number of children: N/A  . Years of education: N/A   Occupational History  . Not on file.   Social History Main Topics  . Smoking status: Never Smoker  . Smokeless tobacco: Never Used  . Alcohol use No  . Drug use: No  . Sexual activity: Yes    Birth control/ protection: None   Other Topics Concern  . Not on file   Social History Narrative  . No  narrative on file    Physical Exam: BP 136/79 (BP Location: Left Arm)   Pulse (!) 124   Temp 98.5 F (36.9 C) (Oral)   Resp 16   Ht  (1.549 m)   Wt 181 lb (82.1 kg)   BMI 34.20 kg/m   Gen: NAD CV: RRR Pulm: CTAB Pelvic: deferred Ext: no e/c/t  Consults: urology  Significant Findings/ Diagnostic Studies: labs: normal (see EPIC) and  radiology:   US Renal  Result Date: 04/15/2017 CLINICAL DATA:  Left flank pain radiating to the suprapubic region. Third trimester pregnancy. EXAM: RENAL / URINARY TRACT  ULTRASOUND COMPLETE COMPARISON:  None. FINDINGS: Right Kidney: Length: 12.9 cm. 9 mm calculus or cluster of calculi in the right kidney lower pole, nonobstructive. Normal echogenicity. No hydronephrosis. Left Kidney: Length: 12.9 cm. Moderate hydronephrosis and proximal hydroureter, with the proximal ureter measuring 1.6 cm in diameter. The left ureter also appears dilated distally. I do not see a definite ureteral calculus, the cause of the presumed left-sided obstruction is not directly visualized. Bladder: Appears normal for degree of bladder distention, however no ureteral jets were observed on either side. IMPRESSION: 1. Moderate left hydronephrosis and moderate left hydroureter. The hydroureter involves the proximal and distal ureter. No definite left-sided renal stones are identified, but there are nonobstructive right (contralateral) kidney lower pole calculi seen. Electronically Signed   By: Gaylyn Rong M.D.   On: 04/15/2017 14:43   Procedures: NST Baseline FHR: 140 beats/min Variability: moderate Accelerations: present Decelerations: absent Tocometry: intermittent contractions  Discharge Condition: improved and stable  Disposition: 01-Home or Self Care  Diet: Regular diet  Discharge Activity: Activity as tolerated   Allergies as of 04/15/2017      Reactions   Codeine Nausea Only   Latex Rash      Medication List    TAKE these medications   cyclobenzaprine 10 MG tablet Commonly known as:  FLEXERIL Take 1 tablet (10 mg total) by mouth 3 (three) times daily as needed for muscle spasms.   DICLEGIS 10-10 MG Tbec Generic drug:  Doxylamine-Pyridoxine Take 10 tablets by mouth 3 (three) times daily. Take one tablet by oral route once daily in the morning, one tablet in the mid-afternoon, and two tablets at bedtime.   hydrOXYzine 25 MG capsule Commonly known as:  VISTARIL Take 25 mg by mouth 3 (three) times daily as needed.   prenatal multivitamin Tabs tablet Take 1  tablet by mouth daily at 12 noon.      Follow-up Information    [facility] Follow up in 1 week(s).   Why:  Keep previously scheduled appointment Contact information: [address] [place] [address] [phone]           Total time spent taking care of this patient: 60 minutes  Signed: Thomasene Mohair, MD  04/15/2017, 4:43 PM

## 2017-04-15 NOTE — Consult Note (Signed)
Urology Consult  I have been asked to see the patient by Dr. Jean Rosenthal, for evaluation and management of possible ureterolithasis.  Chief Complaint: left flank pain  History of Present Illness: Megan Kramer is a 26 y.o. year old with a personal history stones currently 32 weeks gravid admitted to L&D observation with acute onset flank pain. She's had several previous observation admissions over the past week with the same issue. She underwent renal ultrasound today showing moderate left hydroureteronephrosis without a clear ureteral jet. UA today was unremarkable but she did have microscopic blood in her urine a few days ago. No fevers or chills. No nausea or vomiting.  No dysuria or gross hematuria. No UTIs. Currently, her pain is under control but she reports that comes in colicky waves.  She does have a personal history of kidney stones in high school which she passed spontaneously without surgical intervention. At that time, she did have a known nonobstructing calculus and was supposed to follow up with urology but never did.  Her previous pregnancy was uneventful. She is scheduled for C-section due to breech station as well as history of previous C-section.  Past Medical History:  Diagnosis Date  . Anxiety   . Herpes   . Hypertension    pregnancy induced  . Obesity (BMI 30-39.9)   . Postpartum depression associated with first pregnancy   . Second pregnancy     Past Surgical History:  Procedure Laterality Date  . CESAREAN SECTION  12/21/2014   Teresa Coombs, [place]    Home Medications:  Current Meds  Medication Sig  . cyclobenzaprine (FLEXERIL) 10 MG tablet Take 1 tablet (10 mg total) by mouth 3 (three) times daily as needed for muscle spasms.  . Prenatal Vit-Fe Fumarate-FA (PRENATAL MULTIVITAMIN) TABS tablet Take 1 tablet by mouth daily at 12 noon.    Allergies:  Allergies  Allergen Reactions  . Codeine Nausea Only  . Latex Rash    Family History  Problem Relation  Age of Onset  . Hypertension Mother   . Breast cancer Maternal Grandmother     Social History:  reports that she has never smoked. She has never used smokeless tobacco. She reports that she does not drink alcohol or use drugs.  ROS: A complete review of systems was performed.  All systems are negative except for pertinent findings as noted.  Physical Exam:  Vital signs in last 24 hours: Temp:  [98.5 F (36.9 C)] 98.5 F (36.9 C) (04/24 1437) Pulse Rate:  [104-124] 124 (04/24 1437) Resp:  [16-98] 16 (04/24 1437) BP: (124-146)/(71-96) 136/79 (04/24 1437) Weight:  [181 lb (82.1 kg)] 181 lb (82.1 kg) (04/24 1119) Constitutional:  Alert and oriented, No acute distress.  Husband and mother at bedside. HEENT: [place] AT, moist mucus membranes.  Trachea midline, no masses Cardiovascular: Regular rate and rhythm, no clubbing, cyanosis, or edema. Respiratory: Normal respiratory effort, lungs clear bilaterally GI: Abdomen gravid.  GU: Very mild left CVA tenderness with percussion. Skin: No rashes, bruises or suspicious lesions Neurologic: Grossly intact, no focal deficits, moving all 4 extremities Psychiatric: Normal mood and affect   Laboratory Data:   Recent Labs  04/15/17 1309  WBC 8.3  HGB 12.3  HCT 35.3    Recent Labs  04/15/17 1309  NA 138  K 3.9  CL 106  CO2 23  GLUCOSE 74  BUN 6  CREATININE 0.54  CALCIUM 9.4   Results for KYNZLEIGH, BANDEL (MRN [id number]) as of 04/15/2017 17:59  Ref. Range 04/15/2017 11:36  Appearance Latest Ref Range: CLEAR  HAZY (A)  Bacteria, UA Latest Ref Range: NONE SEEN  RARE (A)  Bilirubin Urine Latest Ref Range: NEGATIVE  NEGATIVE  Color, Urine Latest Ref Range: YELLOW  YELLOW (A)  Glucose Latest Ref Range: NEGATIVE mg/dL NEGATIVE  Hgb urine dipstick Latest Ref Range: NEGATIVE  NEGATIVE  Ketones, ur Latest Ref Range: NEGATIVE mg/dL NEGATIVE  Leukocytes, UA Latest Ref Range: NEGATIVE  NEGATIVE  Mucous Unknown PRESENT  Nitrite Latest Ref  Range: NEGATIVE  NEGATIVE  pH Latest Ref Range: 5.0 - 8.0  6.0  Protein Latest Ref Range: NEGATIVE mg/dL NEGATIVE  RBC / HPF Latest Ref Range: 0 - 5 RBC/hpf 0-5  Specific Gravity, Urine Latest Ref Range: 1.005 - 1.030  1.017  Squamous Epithelial / LPF Latest Ref Range: NONE SEEN  0-5 (A)  WBC, UA Latest Ref Range: 0 - 5 WBC/hpf 0-5  Total Protein, Urine Latest Units: mg/dL 17  Protein Creatinine Ratio Latest Ref Range: 0.00 - 0.15 mg/mgCre 0.11  Creatinine, Urine Latest Units: mg/dL 161    Radiologic Imaging: US Renal  Result Date: 04/15/2017 CLINICAL DATA:  Left flank pain radiating to the suprapubic region. Third trimester pregnancy. EXAM: RENAL / URINARY TRACT ULTRASOUND COMPLETE COMPARISON:  None. FINDINGS: Right Kidney: Length: 12.9 cm. 9 mm calculus or cluster of calculi in the right kidney lower pole, nonobstructive. Normal echogenicity. No hydronephrosis. Left Kidney: Length: 12.9 cm. Moderate hydronephrosis and proximal hydroureter, with the proximal ureter measuring 1.6 cm in diameter. The left ureter also appears dilated distally. I do not see a definite ureteral calculus, the cause of the presumed left-sided obstruction is not directly visualized. Bladder: Appears normal for degree of bladder distention, however no ureteral jets were observed on either side. IMPRESSION: 1. Moderate left hydronephrosis and moderate left hydroureter. The hydroureter involves the proximal and distal ureter. No definite left-sided renal stones are identified, but there are nonobstructive right (contralateral) kidney lower pole calculi seen. Electronically Signed   By: Gaylyn Rong M.D.   On: 04/15/2017 14:43    Impression/Assessment:  26 year old female currently [redacted] weeks pregnant with left flank pain, left hydronephrosis, history of stones, and recent microscopic hematuria all of which are suggestive of a possible obstructing left ureteral calculus.  No signs or symptoms of infection or renal  failure.  Pain is currently well controlled.  Plan:  - Lengthy discussion at bedside today regarding options would like her to have a noncontrast CT scan just following delivery to assess for size and location of the stone as well as assess the size and location of her nonobstructing right calculus including further imaging to confirm diagnosis in the form of CT scan, MRI, declined as this would not currently change our management  -Patient was offered left PCN for urinary decompression which would need to remain in place until the patient was delivered. Alternatively, ureteral stent could be considered however, would require anesthesia for placement.  Alternatives including continued observation and pain control was also discussed as an option in the absence of signs and symptoms of infection.  -Consider flomax, urinary strainer, and pain meds as needed  -She is most interested in continued conservative management at this time. We carefully reviewed symptoms of infection including fevers, chills, poorly controlled pain, amongst others. In this situation, she would need to present urgently for left percutaneous nephrostomy tube placement emergently  -We would like her to have a noncontrast CT scan does following delivery to assess the  size and location of her ureteral stone as well as to assess the size and location of her larger right-sided nonobstructing stone on imaging today  -She'll follow up with urology just after delivery or sooner as needed  04/15/2017, 6:00 PM  Vanna Scotland,  MD

## 2017-04-15 NOTE — OB Triage Note (Signed)
Pt G2P1 [redacted]w[redacted]d complains of back pain that radiates to her lower abdomen on her left side. Pt called EMS Saturday for pain and took Flexeril on Monday night and Tuesday morning. Pt states Monday ngiht the Flexeril helped the pain, but that at 0800 this AM pain chanegd to a sharp pain on the left side radiating from the back to the lower abdomen. Pt states decreased fetal movement and denies leaking of fluid.

## 2017-04-15 NOTE — H&P (Signed)
OB History & Physical   History of Present Illness:  Chief Complaint: left flank pain  HPI:  Megan Kramer is a 26 y.o. G2P1001 female at [redacted]w[redacted]d dated by a 6-week ultrasound.  Her pregnancy has been complicated by history of severe gestational hypertension (?preeclampsia with severe features) in previous pregnancy, history of cesarean delivery with prior pregnacny, HSV, obesity with initia BMI  30.6, HSV..    She denies contractions.   She denies leakage of fluid.   She denies vaginal bleeding.   She reports fetal movement.   She has a history of abdominal pain in this pregnancy.  More recently she developed left flank pain and was given flexeril last week, which has worked well.  She had excruciating pain last night and took flexeril for this pain, and the pain did go away.  She woke up this morning to severe, 10/10, pain on her left flank radiating into her left lower abdomen.  She reports no aggravating nor alleviating factors. She tried flexeril this morning with no effect. She has had a little nausea, no emesis.  She has had a little more stooling than normal for her, but she states she wouldn't call it frank diarrhea.  She denies fevers, chills, blood in her stool, urine. She denies vaginal symptoms of itching, burning, irritation, abnormal discharge, and vaginal bleeding. She states that the baby has been moving well, but this morning she could not appreciate it much due to focusing on her pain.  Her pain today is mostly located in her left lower back.  At the moment of the interview her pain has lessened quite a bit.  But, is unsure of why.    Maternal Medical History:   Past Medical History:  Diagnosis Date  . Anxiety   . Herpes   . Hypertension    pregnancy induced  . Obesity (BMI 30-39.9)   . Postpartum depression associated with first pregnancy   . Second pregnancy     Past Surgical History:  Procedure Laterality Date  . CESAREAN SECTION  12/21/2014   [place],      Allergies  Allergen Reactions  . Codeine Nausea Only  . Latex Rash      Medication Sig Start Date End Date Taking? Authorizing Provider  cyclobenzaprine (FLEXERIL) 10 MG tablet Take 1 tablet (10 mg total) by mouth 3 (three) times daily as needed for muscle spasms. 04/11/17  Yes Tresea Mall, CNM  Prenatal Vit-Fe Fumarate-FA (PRENATAL MULTIVITAMIN) TABS tablet Take 1 tablet by mouth daily at 12 noon.   Yes Historical Provider, MD  Doxylamine-Pyridoxine (DICLEGIS) 10-10 MG TBEC Take 10 tablets by mouth 3 (three) times daily. Take one tablet by oral route once daily in the morning, one tablet in the mid-afternoon, and two tablets at bedtime.    Historical Provider, MD  hydrOXYzine (VISTARIL) 25 MG capsule Take 25 mg by mouth 3 (three) times daily as needed.    Historical Provider, MD    OB History  Gravida Para Term Preterm AB Living  SAB TAB Ectopic Multiple Live Births          1    # Outcome Date GA Lbr Len/2nd Weight Sex Delivery Anes PTL Lv  2 Current           1 Term 12/21/14 [redacted]w[redacted]d   F CS-LTranv   LIV     Complications: Herpes genitalia      Prenatal care site: [facility]  Social History: She  reports that she has never smoked. She has never used smokeless tobacco. She reports that she does not drink alcohol or use drugs.  Family History: family history includes Breast cancer in her maternal grandmother; Hypertension in her mother.   Review of Systems: Review of Systems  Constitutional: Negative.   HENT: Negative.   Eyes: Negative.   Respiratory: Negative.   Cardiovascular: Negative.   Gastrointestinal: Positive for abdominal pain and nausea. Negative for blood in stool, constipation, diarrhea (but, more frequent stooling), melena and vomiting.  Genitourinary: Positive for flank pain. Negative for dysuria, frequency, hematuria and urgency.  Musculoskeletal: Positive for back pain. Negative for falls, myalgias and neck pain.  Skin: Negative.    Neurological: Negative.   Psychiatric/Behavioral: Negative.      Physical Exam:  Vital Signs: BP 132/82   Pulse (!) 111   Resp (!) 98   Ht  (1.549 m)   Wt 181 lb (82.1 kg)   BMI 34.20 kg/m   BPs: 146/77, 146/77, 134/78, 135/75, 132/82, 124/79 Physical Exam  Constitutional: She is oriented to person, place, and time and well-developed, well-nourished, and in no distress. No distress.  HENT:  Head: Normocephalic and atraumatic.  Eyes: Conjunctivae are normal.  Cardiovascular: Normal rate and regular rhythm.   Murmur (II/VI SEM over left second interspace) heard. Pulmonary/Chest: Effort normal and breath sounds normal. No respiratory distress. She has no wheezes. She has no rales.  Abdominal: Soft. She exhibits no distension. There is tenderness (mild RUQ tenderness). There is no rebound and no guarding.  Gravid, not tender over uterus or c-section incision scar. Negative CVAT  Genitourinary:  Genitourinary Comments: Deferred for now  Musculoskeletal: Normal range of motion. She exhibits no edema.  Point tenderness especially over lower back, also point tenderness over left buttock in midline near iliac crest  Neurological: She is alert and oriented to person, place, and time. No cranial nerve deficit.  Skin: Skin is warm and dry. No rash noted. She is not diaphoretic. No erythema.  Psychiatric: Mood, affect and judgment normal.    Pertinent Results:  Prenatal Labs: Blood type/Rh A positive  Antibody screen negative  Rubella Immune  Varicella Immune    RPR NR  HBsAg negative  HIV negative  GC negative  Chlamydia negative  Genetic screening declined  1 hour GTT 113  3 hour GTT n/a  GBS Unknown due to GA   Baseline FHR: 140 beats/min   Variability: moderate   Accelerations: present   Decelerations: absent Contractions: present frequency: irritability Overall assessment: category 1  Assessment:  Megan Kramer is a 26 y.o. G23P1001 female at [redacted]w[redacted]d with left  flank pain.     Plan:  1. Admit to Labor & Delivery for observation 2. Left flank pain: UA, urine culture, CBC, CMP, lipase, lactic acid, renal ultrasound. 3. Elevated BPs on admit with h/o of preeclampsai/GHTN in G1: urine protein/creatinine ratio, baseline labs, as above, serial BP monitoring. Trend is BP coming down. Patient was in quite a bit of pain coming onto unit. 4. GBS unknown.  No testing for now unless anticipate need for delivery, which I do not at this time.   5. Fetwal well-being: reassuring overall.   Thomasene Mohair, MD 04/15/2017 1:17 PM

## 2017-04-15 NOTE — Telephone Encounter (Signed)
Pt calling stating she is 32wks, has been in and out of L&D over the last week or so.  She woke up this am c really bad back pain, feels a lot of pressure like she has to go to the bathroom.  She been so many times there is nothing left.  Flexeril has been helping but hasn't touched it this am.  She wants answers.  While talking to her it sounded like she was panting.  Adv to go back to L&D via ED.  Dondra Spry at L&D notified.

## 2017-04-16 LAB — URINE CULTURE: CULTURE: NO GROWTH

## 2017-04-17 ENCOUNTER — Telehealth: Payer: Self-pay | Admitting: Urology

## 2017-04-17 NOTE — Telephone Encounter (Signed)
-----   Message from Vanna Scotland, MD sent at 04/15/2017  6:10 PM EDT ----- Regarding: f/u in 2 months This patient needs to be seen in our office following the delivery of her child. A CT scan will be ordered in the hospital prior to follow-up.  Vanna Scotland, MD

## 2017-04-17 NOTE — Telephone Encounter (Signed)
appt made LM to CB to confirm appt and mailed to pt  Megan Kramer

## 2017-04-22 ENCOUNTER — Ambulatory Visit: Payer: 59 | Admitting: Urology

## 2017-04-24 ENCOUNTER — Telehealth: Payer: Self-pay

## 2017-04-24 NOTE — Telephone Encounter (Signed)
Pt calling - is 34wks preg and started feeling like she's getting a UTI yesterday.  Wants to know if okay to take AZO to relieve spasms.  Per AMS okay.  Pt to keep appt tomorrow.

## 2017-04-25 ENCOUNTER — Ambulatory Visit (INDEPENDENT_AMBULATORY_CARE_PROVIDER_SITE_OTHER): Payer: Managed Care, Other (non HMO) | Admitting: Certified Nurse Midwife

## 2017-04-25 ENCOUNTER — Encounter: Payer: Managed Care, Other (non HMO) | Admitting: Advanced Practice Midwife

## 2017-04-25 VITALS — BP 112/72 | Wt 179.0 lb

## 2017-04-25 DIAGNOSIS — O26893 Other specified pregnancy related conditions, third trimester: Secondary | ICD-10-CM

## 2017-04-25 DIAGNOSIS — N3289 Other specified disorders of bladder: Secondary | ICD-10-CM

## 2017-04-25 DIAGNOSIS — Z3A34 34 weeks gestation of pregnancy: Secondary | ICD-10-CM | POA: Diagnosis not present

## 2017-04-25 DIAGNOSIS — Z23 Encounter for immunization: Secondary | ICD-10-CM

## 2017-04-25 DIAGNOSIS — R300 Dysuria: Secondary | ICD-10-CM

## 2017-04-25 DIAGNOSIS — N200 Calculus of kidney: Secondary | ICD-10-CM

## 2017-04-25 LAB — POCT URINALYSIS DIPSTICK
BILIRUBIN UA: NEGATIVE
GLUCOSE UA: NEGATIVE
Ketones, UA: NEGATIVE
LEUKOCYTES UA: NEGATIVE
NITRITE UA: NEGATIVE
RBC UA: NEGATIVE
Spec Grav, UA: 1.02 (ref 1.010–1.025)
UROBILINOGEN UA: NEGATIVE U/dL — AB
pH, UA: 6 (ref 5.0–8.0)

## 2017-04-25 NOTE — Progress Notes (Signed)
Pt called yesterday, started having bladder spasms on Wednesday 04/23/17 feels like possible UTI, started taking AZO. FKC's given.

## 2017-04-27 LAB — URINE CULTURE

## 2017-04-28 ENCOUNTER — Encounter: Payer: Self-pay | Admitting: Certified Nurse Midwife

## 2017-04-28 NOTE — Progress Notes (Signed)
Seen in L&D 4/24 for left flank pain. Had ultrasound revealing right non obstructing stones and left hydroureter. Urologist offered percutaneous nephrostomy and patient declined. Flank pain mild at this time. Having some bladder type spasms x 2 days and is taking Azo. Urine culture sent today Need non contrast CT postpartum. KNows that she needs to go to ER for worsening pain, fever FKC instructions today. TDAP given today. Good FM ROB in 2 weeks.

## 2017-05-09 ENCOUNTER — Ambulatory Visit (INDEPENDENT_AMBULATORY_CARE_PROVIDER_SITE_OTHER): Payer: Self-pay | Admitting: Certified Nurse Midwife

## 2017-05-09 VITALS — BP 112/72 | Wt 183.0 lb

## 2017-05-09 DIAGNOSIS — Z3685 Encounter for antenatal screening for Streptococcus B: Secondary | ICD-10-CM

## 2017-05-09 DIAGNOSIS — O0993 Supervision of high risk pregnancy, unspecified, third trimester: Secondary | ICD-10-CM

## 2017-05-09 DIAGNOSIS — Z3A36 36 weeks gestation of pregnancy: Secondary | ICD-10-CM

## 2017-05-09 DIAGNOSIS — R350 Frequency of micturition: Secondary | ICD-10-CM

## 2017-05-09 DIAGNOSIS — N200 Calculus of kidney: Secondary | ICD-10-CM

## 2017-05-09 HISTORY — DX: Calculus of kidney: N20.0

## 2017-05-09 NOTE — Progress Notes (Signed)
Pt c/o still having bladder spasms and cramping from hip to hip but no bleeding. GBS today.

## 2017-05-09 NOTE — Progress Notes (Signed)
Intermittent bladder spasms causing urinary frequency, urgency, voiding small amts not resolved with AZO. None today Does have pelvic laxity pain in hips and some back pain Baby OP on Leopold's GBS done RX for Pyridium 200 mgm tid prn. Cervix closed/ 50%/-3 Urine dipstick today positive for only blood, no leuks or nitrites Plan to do long dip with each ROB to look for UTI  ROB in 1 week BC options discussed

## 2017-05-11 LAB — STREP GP B NAA: Strep Gp B NAA: POSITIVE — AB

## 2017-05-12 ENCOUNTER — Other Ambulatory Visit: Payer: Self-pay | Admitting: Certified Nurse Midwife

## 2017-05-13 ENCOUNTER — Telehealth: Payer: Self-pay | Admitting: Certified Nurse Midwife

## 2017-05-13 ENCOUNTER — Other Ambulatory Visit: Payer: Self-pay | Admitting: Certified Nurse Midwife

## 2017-05-13 MED ORDER — VALACYCLOVIR HCL 500 MG PO TABS: 500.0000 mg | ORAL_TABLET | Freq: Every day | ORAL | 1 refills | Status: DC

## 2017-05-13 NOTE — Telephone Encounter (Signed)
Discussed starting Valtrex prophylaxis. RX e prescribed to pharmacy

## 2017-05-15 ENCOUNTER — Ambulatory Visit (INDEPENDENT_AMBULATORY_CARE_PROVIDER_SITE_OTHER): Payer: 59 | Admitting: Advanced Practice Midwife

## 2017-05-15 VITALS — BP 128/68 | Wt 184.0 lb

## 2017-05-15 DIAGNOSIS — Z3A36 36 weeks gestation of pregnancy: Secondary | ICD-10-CM

## 2017-05-15 NOTE — Progress Notes (Signed)
No bladder spasms in past week. No s/s of UTI. Long dip negative. Hip pain continues- she has ordered an abdominal support band. Taking valtrex daily. Baby is breech today per heart tones in upper quadrant/leopolds. Occasional contractions.

## 2017-05-23 ENCOUNTER — Ambulatory Visit (INDEPENDENT_AMBULATORY_CARE_PROVIDER_SITE_OTHER): Payer: Managed Care, Other (non HMO) | Admitting: Advanced Practice Midwife

## 2017-05-23 VITALS — BP 134/74 | Wt 188.0 lb

## 2017-05-23 DIAGNOSIS — Z3A38 38 weeks gestation of pregnancy: Secondary | ICD-10-CM

## 2017-05-23 NOTE — Progress Notes (Signed)
Cervical check today,  A lot of pain and pressure/contractions

## 2017-05-23 NOTE — Progress Notes (Signed)
Had strong regular contractions every 3-6 minutes for over an hour last night. She got in the shower and the contractions stopped. No LOF, VB. Good fetal movement. Labor precautions reviewed- patient knows to go to L&D for labor check given scheduled c/section. Heart tones in lower quadrant today and feels vertex on leopolds. Cervix is closed today. Continues on valtrex.

## 2017-05-27 ENCOUNTER — Encounter
Admission: EM | Disposition: A | Discharge status: Home / Self Care | Payer: Self-pay | Source: Home / Self Care | Attending: Obstetrics and Gynecology

## 2017-05-27 ENCOUNTER — Inpatient Hospital Stay
Admission: EM | Admit: 2017-05-27 | Discharge: 2017-05-30 | DRG: 765 | Disposition: A | Discharge status: Home / Self Care | Payer: 59 | Attending: Obstetrics and Gynecology | Admitting: Obstetrics and Gynecology

## 2017-05-27 ENCOUNTER — Observation Stay: Payer: 59 | Admitting: Registered Nurse

## 2017-05-27 DIAGNOSIS — O0990 Supervision of high risk pregnancy, unspecified, unspecified trimester: Secondary | ICD-10-CM

## 2017-05-27 DIAGNOSIS — O9081 Anemia of the puerperium: Secondary | ICD-10-CM | POA: Diagnosis not present

## 2017-05-27 DIAGNOSIS — Z98891 History of uterine scar from previous surgery: Secondary | ICD-10-CM

## 2017-05-27 DIAGNOSIS — O26833 Pregnancy related renal disease, third trimester: Secondary | ICD-10-CM

## 2017-05-27 DIAGNOSIS — O1404 Mild to moderate pre-eclampsia, complicating childbirth: Secondary | ICD-10-CM | POA: Diagnosis present

## 2017-05-27 DIAGNOSIS — Z91040 Latex allergy status: Secondary | ICD-10-CM | POA: Diagnosis not present

## 2017-05-27 DIAGNOSIS — D62 Acute posthemorrhagic anemia: Secondary | ICD-10-CM | POA: Diagnosis not present

## 2017-05-27 DIAGNOSIS — N1330 Unspecified hydronephrosis: Secondary | ICD-10-CM | POA: Diagnosis present

## 2017-05-27 DIAGNOSIS — O1493 Unspecified pre-eclampsia, third trimester: Secondary | ICD-10-CM

## 2017-05-27 DIAGNOSIS — O34211 Maternal care for low transverse scar from previous cesarean delivery: Secondary | ICD-10-CM | POA: Diagnosis present

## 2017-05-27 DIAGNOSIS — N200 Calculus of kidney: Secondary | ICD-10-CM | POA: Diagnosis present

## 2017-05-27 DIAGNOSIS — O9989 Other specified diseases and conditions complicating pregnancy, childbirth and the puerperium: Secondary | ICD-10-CM | POA: Diagnosis present

## 2017-05-27 DIAGNOSIS — O1494 Unspecified pre-eclampsia, complicating childbirth: Secondary | ICD-10-CM | POA: Diagnosis present

## 2017-05-27 DIAGNOSIS — Z3A38 38 weeks gestation of pregnancy: Secondary | ICD-10-CM

## 2017-05-27 DIAGNOSIS — N202 Calculus of kidney with calculus of ureter: Secondary | ICD-10-CM

## 2017-05-27 LAB — COMPREHENSIVE METABOLIC PANEL
ALK PHOS: 202 U/L — AB (ref 38–126)
ALT: 11 U/L — AB (ref 14–54)
AST: 24 U/L (ref 15–41)
Albumin: 3 g/dL — ABNORMAL LOW (ref 3.5–5.0)
Anion gap: 8 (ref 5–15)
BILIRUBIN TOTAL: 0.5 mg/dL (ref 0.3–1.2)
BUN: 7 mg/dL (ref 6–20)
CALCIUM: 9 mg/dL (ref 8.9–10.3)
CO2: 24 mmol/L (ref 22–32)
CREATININE: 0.34 mg/dL — AB (ref 0.44–1.00)
Chloride: 101 mmol/L (ref 101–111)
GFR calc Af Amer: >60 mL/min (ref >60)
GLUCOSE: 74 mg/dL (ref 65–99)
POTASSIUM: 3.8 mmol/L (ref 3.5–5.1)
Sodium: 133 mmol/L — ABNORMAL LOW (ref 135–145)
TOTAL PROTEIN: 6.6 g/dL (ref 6.5–8.1)

## 2017-05-27 LAB — CBC
HCT: 34.3 % — ABNORMAL LOW (ref 35.0–47.0)
Hemoglobin: 11.7 g/dL — ABNORMAL LOW (ref 12.0–16.0)
MCH: 29.2 pg (ref 26.0–34.0)
MCHC: 34 g/dL (ref 32.0–36.0)
MCV: 85.8 fL (ref 80.0–100.0)
PLATELETS: 283 10*3/uL (ref 150–440)
RBC: 4 MIL/uL (ref 3.80–5.20)
RDW: 14.5 % (ref 11.5–14.5)
WBC: 8.4 10*3/uL (ref 3.6–11.0)

## 2017-05-27 LAB — OB RESULTS CONSOLE HEPATITIS B SURFACE ANTIGEN: HEP B S AG: NEGATIVE

## 2017-05-27 LAB — PROTEIN / CREATININE RATIO, URINE
Creatinine, Urine: 165 mg/dL
Protein Creatinine Ratio: 0.33 mg/mg{Cre} — ABNORMAL HIGH (ref 0.00–0.15)
TOTAL PROTEIN, URINE: 55 mg/dL

## 2017-05-27 LAB — OB RESULTS CONSOLE RUBELLA ANTIBODY, IGM: Rubella: IMMUNE

## 2017-05-27 LAB — OB RESULTS CONSOLE VARICELLA ZOSTER ANTIBODY, IGG: Varicella: IMMUNE

## 2017-05-27 LAB — TYPE AND SCREEN
ABO/RH(D): A POS
ANTIBODY SCREEN: NEGATIVE

## 2017-05-27 LAB — OB RESULTS CONSOLE GC/CHLAMYDIA
CHLAMYDIA, DNA PROBE: NEGATIVE
Gonorrhea: NEGATIVE

## 2017-05-27 SURGERY — Surgical Case
Anesthesia: Spinal

## 2017-05-27 MED ORDER — ACETAMINOPHEN 325 MG PO TABS
650.0000 mg | ORAL_TABLET | ORAL | Status: DC | PRN
  Administered 2017-05-27: 650 mg via ORAL

## 2017-05-27 MED ORDER — OXYTOCIN 40 UNITS IN LACTATED RINGERS INFUSION - SIMPLE MED
INTRAVENOUS | Status: DC | PRN
  Administered 2017-05-27: 500 mL via INTRAVENOUS

## 2017-05-27 MED ORDER — BUPIVACAINE 0.25 % ON-Q PUMP DUAL CATH 400 ML
400.0000 mL | INJECTION | Status: DC
Start: 2017-05-27 — End: 2017-05-27
  Filled 2017-05-27 (×2): qty 400

## 2017-05-27 MED ORDER — MORPHINE SULFATE (PF) 0.5 MG/ML IJ SOLN
INTRAMUSCULAR | Status: AC
  Filled 2017-05-27: qty 10

## 2017-05-27 MED ORDER — BUPIVACAINE HCL 0.5 % IJ SOLN
10.0000 mL | Freq: Once | INTRAMUSCULAR | Status: DC
  Filled 2017-05-27 (×2): qty 10

## 2017-05-27 MED ORDER — BUPIVACAINE HCL 0.5 % IJ SOLN
INTRAMUSCULAR | Status: DC | PRN
  Administered 2017-05-27: 10 mL

## 2017-05-27 MED ORDER — CARBOPROST TROMETHAMINE 250 MCG/ML IM SOLN
INTRAMUSCULAR | Status: AC
  Filled 2017-05-27: qty 1

## 2017-05-27 MED ORDER — ACETAMINOPHEN 325 MG PO TABS
650.0000 mg | ORAL_TABLET | Freq: Four times a day (QID) | ORAL | Status: DC | PRN
  Administered 2017-05-27: 650 mg via ORAL
  Filled 2017-05-27: qty 2

## 2017-05-27 MED ORDER — NALBUPHINE HCL 10 MG/ML IJ SOLN: 5.0000 mg | INTRAMUSCULAR | Status: DC | PRN

## 2017-05-27 MED ORDER — MEPERIDINE HCL 25 MG/ML IJ SOLN: 6.2500 mg | INTRAMUSCULAR | Status: DC | PRN

## 2017-05-27 MED ORDER — OXYTOCIN 40 UNITS IN LACTATED RINGERS INFUSION - SIMPLE MED
INTRAVENOUS | Status: AC
  Filled 2017-05-27: qty 1000

## 2017-05-27 MED ORDER — SOD CITRATE-CITRIC ACID 500-334 MG/5ML PO SOLN
30.0000 mL | ORAL | Status: AC
  Administered 2017-05-27: 30 mL via ORAL

## 2017-05-27 MED ORDER — ONDANSETRON HCL 4 MG/2ML IJ SOLN: 4.0000 mg | Freq: Three times a day (TID) | INTRAMUSCULAR | Status: DC | PRN

## 2017-05-27 MED ORDER — CEFOXITIN SODIUM-DEXTROSE 2-2.2 GM-% IV SOLR (PREMIX)
2.0000 g | INTRAVENOUS | Status: AC
  Administered 2017-05-27: 2 g via INTRAVENOUS

## 2017-05-27 MED ORDER — ONDANSETRON HCL 4 MG/2ML IJ SOLN
INTRAMUSCULAR | Status: DC | PRN
  Administered 2017-05-27: 4 mg via INTRAVENOUS

## 2017-05-27 MED ORDER — NALOXONE HCL 0.4 MG/ML IJ SOLN: 0.4000 mg | INTRAMUSCULAR | Status: DC | PRN

## 2017-05-27 MED ORDER — NALBUPHINE HCL 10 MG/ML IJ SOLN: 5.0000 mg | Freq: Once | INTRAMUSCULAR | Status: AC | PRN

## 2017-05-27 MED ORDER — OXYTOCIN 40 UNITS IN LACTATED RINGERS INFUSION - SIMPLE MED: INTRAVENOUS | Status: DC | PRN

## 2017-05-27 MED ORDER — FENTANYL CITRATE (PF) 100 MCG/2ML IJ SOLN
INTRAMUSCULAR | Status: DC | PRN
  Administered 2017-05-27: 15 ug via INTRATHECAL

## 2017-05-27 MED ORDER — PHENYLEPHRINE HCL 10 MG/ML IJ SOLN
INTRAMUSCULAR | Status: DC | PRN
  Administered 2017-05-27: 100 ug via INTRAVENOUS

## 2017-05-27 MED ORDER — SOD CITRATE-CITRIC ACID 500-334 MG/5ML PO SOLN
ORAL | Status: AC
  Filled 2017-05-27: qty 15

## 2017-05-27 MED ORDER — KETOROLAC TROMETHAMINE 30 MG/ML IJ SOLN: 30.0000 mg | Freq: Four times a day (QID) | INTRAMUSCULAR | Status: DC | PRN

## 2017-05-27 MED ORDER — PROPOFOL 10 MG/ML IV BOLUS
INTRAVENOUS | Status: AC
  Filled 2017-05-27: qty 20

## 2017-05-27 MED ORDER — ONDANSETRON HCL 4 MG/2ML IJ SOLN: 4.0000 mg | Freq: Once | INTRAMUSCULAR | Status: DC | PRN

## 2017-05-27 MED ORDER — CEFOXITIN SODIUM-DEXTROSE 2-2.2 GM-% IV SOLR (PREMIX)
INTRAVENOUS | Status: AC
  Filled 2017-05-27: qty 50

## 2017-05-27 MED ORDER — DIPHENHYDRAMINE HCL 25 MG PO CAPS: 25.0000 mg | ORAL_CAPSULE | ORAL | Status: DC | PRN

## 2017-05-27 MED ORDER — MORPHINE SULFATE (PF) 0.5 MG/ML IJ SOLN
INTRAMUSCULAR | Status: DC | PRN
Start: 2017-05-27 — End: 2017-05-27
  Administered 2017-05-27: .1 mg via INTRATHECAL

## 2017-05-27 MED ORDER — IBUPROFEN 600 MG PO TABS
ORAL_TABLET | ORAL | Status: AC
  Filled 2017-05-27: qty 1

## 2017-05-27 MED ORDER — LACTATED RINGERS IV SOLN
500.0000 mL | INTRAVENOUS | Status: DC | PRN
  Administered 2017-05-27: 1000 mL via INTRAVENOUS

## 2017-05-27 MED ORDER — ACETAMINOPHEN 325 MG PO TABS
ORAL_TABLET | ORAL | Status: AC
  Filled 2017-05-27: qty 2

## 2017-05-27 MED ORDER — FENTANYL CITRATE (PF) 100 MCG/2ML IJ SOLN
25.0000 ug | INTRAMUSCULAR | Status: DC | PRN
  Administered 2017-05-27: 50 ug via INTRAVENOUS
  Filled 2017-05-27: qty 2

## 2017-05-27 MED ORDER — NALBUPHINE HCL 10 MG/ML IJ SOLN
5.0000 mg | INTRAMUSCULAR | Status: DC | PRN
  Filled 2017-05-27: qty 1

## 2017-05-27 MED ORDER — LACTATED RINGERS IV SOLN
INTRAVENOUS | Status: DC
  Administered 2017-05-27: 21:00:00 via INTRAVENOUS

## 2017-05-27 MED ORDER — NALBUPHINE HCL 10 MG/ML IJ SOLN
5.0000 mg | Freq: Once | INTRAMUSCULAR | Status: AC | PRN
  Administered 2017-05-27: 5 mg via INTRAVENOUS

## 2017-05-27 MED ORDER — DIPHENHYDRAMINE HCL 50 MG/ML IJ SOLN: 12.5000 mg | INTRAMUSCULAR | Status: DC | PRN

## 2017-05-27 MED ORDER — DEXTROSE 5 % IV SOLN
1.0000 ug/kg/h | INTRAVENOUS | Status: DC | PRN
  Filled 2017-05-27: qty 2

## 2017-05-27 MED ORDER — BUPIVACAINE IN DEXTROSE 0.75-8.25 % IT SOLN
INTRATHECAL | Status: DC | PRN
  Administered 2017-05-27: 1.6 mL via INTRATHECAL

## 2017-05-27 MED ORDER — FENTANYL CITRATE (PF) 100 MCG/2ML IJ SOLN
INTRAMUSCULAR | Status: AC
  Filled 2017-05-27: qty 2

## 2017-05-27 MED ORDER — METHYLERGONOVINE MALEATE 0.2 MG/ML IJ SOLN
INTRAMUSCULAR | Status: AC
  Filled 2017-05-27: qty 1

## 2017-05-27 MED ORDER — SODIUM CHLORIDE 0.9% FLUSH: 3.0000 mL | INTRAVENOUS | Status: DC | PRN

## 2017-05-27 SURGICAL SUPPLY — 24 items
BARRIER ADHS 3X4 INTERCEED (GAUZE/BANDAGES/DRESSINGS) ×2 IMPLANT
CANISTER SUCT 3000ML PPV (MISCELLANEOUS) ×2 IMPLANT
CATH KIT ON-Q SILVERSOAK 5IN (CATHETERS) ×4 IMPLANT
CHLORAPREP W/TINT 26ML (MISCELLANEOUS) ×4 IMPLANT
DERMABOND ADVANCED (GAUZE/BANDAGES/DRESSINGS) ×1
DERMABOND ADVANCED .7 DNX12 (GAUZE/BANDAGES/DRESSINGS) ×1 IMPLANT
DRSG OPSITE POSTOP 4X10 (GAUZE/BANDAGES/DRESSINGS) ×2 IMPLANT
ELECT CAUTERY BLADE 6.4 (BLADE) ×2 IMPLANT
ELECT REM PT RETURN 9FT ADLT (ELECTROSURGICAL) ×2
ELECTRODE REM PT RTRN 9FT ADLT (ELECTROSURGICAL) ×1 IMPLANT
GLOVE SKINSENSE NS SZ8.0 LF (GLOVE) ×6
GLOVE SKINSENSE STRL SZ8.0 LF (GLOVE) ×6 IMPLANT
GOWN STRL REUS W/ TWL LRG LVL3 (GOWN DISPOSABLE) ×1 IMPLANT
GOWN STRL REUS W/ TWL XL LVL3 (GOWN DISPOSABLE) ×2 IMPLANT
GOWN STRL REUS W/TWL LRG LVL3 (GOWN DISPOSABLE) ×1
GOWN STRL REUS W/TWL XL LVL3 (GOWN DISPOSABLE) ×2
NS IRRIG 1000ML POUR BTL (IV SOLUTION) ×2 IMPLANT
PACK C SECTION AR (MISCELLANEOUS) ×2 IMPLANT
PAD OB MATERNITY 4.3X12.25 (PERSONAL CARE ITEMS) ×2 IMPLANT
PAD PREP 24X41 OB/GYN DISP (PERSONAL CARE ITEMS) ×2 IMPLANT
SUT MAXON ABS #0 GS21 30IN (SUTURE) ×4 IMPLANT
SUT VIC AB 1 CT1 36 (SUTURE) ×6 IMPLANT
SUT VIC AB 2-0 CT1 36 (SUTURE) IMPLANT
SUT VIC AB 4-0 FS2 27 (SUTURE) ×2 IMPLANT

## 2017-05-27 NOTE — Transfer of Care (Signed)
Immediate Anesthesia Transfer of Care Note  Patient: Megan Kramer E Mathew  Procedure(s) Performed: Procedure(s): CESAREAN SECTION (N/A)  Patient Location: PACU  Anesthesia Type:Spinal  Level of Consciousness: awake, alert  and oriented  Airway & Oxygen Therapy: Patient Spontanous Breathing  Post-op Assessment: Report given to RN and Post -op Vital signs reviewed and stable  Post vital signs: Reviewed and stable  Last Vitals:  Vitals:   05/27/17 1929 05/27/17 2200  BP: (!) 154/96 120/70  Pulse: (!) 112 96  Resp:  14  Temp:  36.7 C    Last Pain:  Vitals:   05/27/17 2200  TempSrc: Oral  PainSc:       Patients Stated Pain Goal: 0 (05/27/17 1703)  Complications: No apparent anesthesia complications

## 2017-05-27 NOTE — Discharge Summary (Signed)
OB Discharge Summary     Patient Name: Megan Kramer DOB: [birth year] MRN: [id number]  Date of admission: 05/27/2017 Delivering MD: Vena Austria, MD  Date of Delivery: 05/30/2017  Date of discharge: 05/30/2017  Admitting diagnosis: 38 weeks preg preeclampsia Intrauterine pregnancy: [redacted]w[redacted]d     Secondary diagnosis: Preeclampsia     Discharge diagnosis: Term Pregnancy Delivered, Preeclampsia (mild) and Prior CS                                                                                                Post partum procedures:none  Augmentation: none  Complications: None  Hospital course:  Onset of Labor With Unplanned C/S  26 y.o. yo G2P1001 at [redacted]w[redacted]d was admitted in Latent Labor/ no labor but with elevated BPS and findings c/w preeclampsia on 05/27/2017. Patient had a labor course significant for decision for repeat CS at this time.   The patient went for cesarean section due to Elective Repeat, and delivered a Viable infant,05/27/2017  Details of operation can be found in separate operative note. Patient had an uncomplicated postpartum course.  She is ambulating,tolerating a regular diet, passing flatus, and urinating well.  Patient is discharged home in stable condition 05/30/17.  Physical exam  Vitals:   05/29/17 1932 05/29/17 2318 05/30/17 0347 05/30/17 0817  BP: (!) 142/93 127/67 129/73 136/76  Pulse: 85 73 82 66  Resp: 14 16 14 17   Temp: 97.7 F (36.5 C) 98.1 F (36.7 C) 98 F (36.7 C) 97.9 F (36.6 C)  TempSrc: Oral Oral Oral Oral  SpO2: 100% 100% 100% 100%  Weight:      Height:       General: alert and no distress Lochia: appropriate Uterine Fundus: firm Incision: Healing well with no significant drainage DVT Evaluation: No evidence of DVT seen on physical exam.  Labs: Lab Results  Component Value Date   WBC 11.0 05/28/2017   HGB 11.7 (L) 05/28/2017   HCT 34.3 (L) 05/28/2017   MCV 85.0 05/28/2017   PLT 251 05/28/2017   CMP Latest Ref Rng & Units 05/27/2017   Glucose 65 - 99 mg/dL 74  BUN 6 - 20 mg/dL 7  Creatinine 9.56 - 2.13 mg/dL 0.86(V)  Sodium 784 - 696 mmol/L 133(L)  Potassium 3.5 - 5.1 mmol/L 3.8  Chloride 101 - 111 mmol/L 101  CO2 22 - 32 mmol/L 24  Calcium 8.9 - 10.3 mg/dL 9.0  Total Protein 6.5 - 8.1 g/dL 6.6  Total Bilirubin 0.3 - 1.2 mg/dL 0.5  Alkaline Phos 38 - 126 U/L 202(H)  AST 15 - 41 U/L 24  ALT 14 - 54 U/L 11(L)    Discharge instruction: per After Visit Summary.  Medications:  Allergies as of 05/30/2017      Reactions   Codeine Nausea Only   Latex Rash      Medication List    STOP taking these medications   cyclobenzaprine 10 MG tablet Commonly known as:  FLEXERIL   DICLEGIS 10-10 MG Tbec Generic drug:  Doxylamine-Pyridoxine   valACYclovir 500 MG tablet Commonly known as:  VALTREX     TAKE  these medications   hydrOXYzine 25 MG capsule Commonly known as:  VISTARIL Take 25 mg by mouth 3 (three) times daily as needed.   ibuprofen 600 MG tablet Commonly known as:  ADVIL,MOTRIN Take 1 tablet (600 mg total) by mouth every 6 (six) hours as needed.   oxyCODONE-acetaminophen 5-325 MG tablet Commonly known as:  PERCOCET/ROXICET Take 1-2 tablets by mouth every 4 (four) hours as needed (pain scale 4-7).   prenatal multivitamin Tabs tablet Take 1 tablet by mouth daily at 12 noon.       Diet: routine diet  Activity: Advance as tolerated. Pelvic rest for 6 weeks.   Outpatient follow up: Follow-up Information    Nadara MustardHarris, Robert P, MD. Schedule an appointment as soon as possible for a visit in 1 week(s).   Specialty:  Obstetrics and Gynecology Why:  for post op; cancel other appt's Contact information: [address] [place] [address] [phone]             Postpartum contraception: Nexplanon Rhogam Given postpartum: no Rubella vaccine given postpartum: no Varicella vaccine given postpartum: no   Newborn Data: Live born female  Birth Weight: 7 lb 10.1 oz (3460 g) APGAR: 8,  9   Baby Feeding: Breast  Disposition:home with mother  SIGNED: Vena AustriaAndreas Amarilis Belflower, MD 05/30/2017 8:55 AM

## 2017-05-27 NOTE — H&P (Signed)
History and Physical Interval Note:  05/27/2017 8:21 PM  Megan Kramer  has presented today for surgery, with the diagnosis of preeclampsia and prior cesarean.  The various methods of treatment have been discussed with the patient and family. After consideration of risks, benefits and other options for treatment, the patient has consented to  Procedure(s): CESAREAN SECTION (N/A) as a surgical intervention .  The patient's history has been reviewed, patient examined, no change in status, stable for surgery.  Pt has the following beta blocker history-  Not taking Beta Blocker.  I have reviewed the patient's chart and labs.  Questions were answered to the patient's satisfaction.    Letitia Libraobert Paul Bon Dowis

## 2017-05-27 NOTE — Anesthesia Post-op Follow-up Note (Cosign Needed)
Anesthesia QCDR form completed.        

## 2017-05-27 NOTE — Anesthesia Preprocedure Evaluation (Signed)
Anesthesia Evaluation  Patient identified by MRN, date of birth, ID band Patient awake    Reviewed: Allergy & Precautions, H&P , NPO status , Patient's Chart, lab work & pertinent test results, reviewed documented beta blocker date and time   History of Anesthesia Complications Negative for: history of anesthetic complications  Airway Mallampati: III  TM Distance: >3 FB Neck ROM: full    Dental  (+) Teeth Intact, Dental Advidsory Given Permanent retainer:   Pulmonary neg shortness of breath, asthma , neg sleep apnea, neg COPD, neg recent URI,           Cardiovascular Exercise Tolerance: Good hypertension (Pre-eclampsia), (-) angina(-) CAD, (-) Past MI, (-) Cardiac Stents and (-) CABG (-) dysrhythmias + Valvular Problems/Murmurs (as a child)      Neuro/Psych negative neurological ROS  negative psych ROS   GI/Hepatic Neg liver ROS, GERD  ,  Endo/Other  negative endocrine ROS  Renal/GU Renal disease (kidney stones)  negative genitourinary   Musculoskeletal   Abdominal   Peds  Hematology negative hematology ROS (+)   Anesthesia Other Findings Past Medical History: No date: Anxiety No date: Herpes No date: Hypertension     Comment: pregnancy induced No date: Kidney stones No date: Obesity (BMI 30-39.9) No date: Postpartum depression associated with first pr* No date: Second pregnancy   Reproductive/Obstetrics (+) Pregnancy                             Anesthesia Physical Anesthesia Plan  ASA: III  Anesthesia Plan: Spinal   Post-op Pain Management:    Induction:   PONV Risk Score and Plan: 3 and Ondansetron  Airway Management Planned:   Additional Equipment:   Intra-op Plan:   Post-operative Plan:   Informed Consent: I have reviewed the patients History and Physical, chart, labs and discussed the procedure including the risks, benefits and alternatives for the proposed  anesthesia with the patient or authorized representative who has indicated his/her understanding and acceptance.   Dental Advisory Given  Plan Discussed with: Anesthesiologist, CRNA and Surgeon  Anesthesia Plan Comments:         Anesthesia Quick Evaluation

## 2017-05-27 NOTE — OB Triage Note (Signed)
Pt prsesnts c/o ctx that started at 0500 this morning waking her out of her sleep, with constant cramping. Pt states she has been nauseous. She also has a headache rating 7/10. Pt states she has had some blurred vision as well. Denies any LOF or bleeding. Pt reports positive fetal movement. +1 pitting edema, no clonus, +2 reflexes, lungs are clear. Will continue to monitor BP

## 2017-05-27 NOTE — Anesthesia Procedure Notes (Signed)
Spinal  Patient location during procedure: OR Start time: 05/27/2017 8:54 PM End time: 05/27/2017 8:56 PM Staffing Anesthesiologist: Martha Clan Resident/CRNA: Hedda Slade Performed: resident/CRNA  Preanesthetic Checklist Completed: patient identified, site marked, surgical consent, pre-op evaluation, timeout performed, IV checked, risks and benefits discussed and monitors and equipment checked Spinal Block Patient position: sitting Prep: ChloraPrep Patient monitoring: heart rate, continuous pulse ox, blood pressure and cardiac monitor Approach: midline Location: L4-5 Injection technique: single-shot Needle Needle type: Whitacre and Introducer  Needle gauge: 24 G Needle length: 9 cm Assessment Sensory level: T10 Additional Notes Negative paresthesia. Negative blood return. Positive free-flowing CSF. Expiration date of kit checked and confirmed. Patient tolerated procedure well, without complications.

## 2017-05-27 NOTE — Op Note (Signed)
Cesarean Section Procedure Note Indications: Preeclampsia without severe features, prior cesarean section and term intrauterine pregnancy  Pre-operative Diagnosis: Intrauterine pregnancy 1172w4d ;  Preeclampsia without severe features, prior cesarean section and term intrauterine pregnancy Post-operative Diagnosis: same, delivered. Procedure: Low Transverse Cesarean Section Surgeon: Annamarie MajorPaul Shanise Balch, MD, FACOG Assistant(s): Maebelle Munroe Gutierrez, CNM Anesthesia: Spinal anesthesia Estimated Blood Loss:200  Complications: None; patient tolerated the procedure well. Disposition: PACU - hemodynamically stable. Condition: stable  Findings: A female infant in the cephalic presentation. Amniotic fluid - Clear  Birth weight 3640 g.  Apgars of 8 and 9.  Intact placenta with a three-vessel cord. Grossly normal uterus, tubes and ovaries bilaterally. Many intraabdominal adhesions were noted.  Thin LUS where scar is.    Procedure Details   The patient was taken to Operating Room, identified as the correct patient and the procedure verified as C-Section Delivery. A Time Out was held and the above information confirmed. After induction of anesthesia, the patient was draped and prepped in the usual sterile manner. A Pfannenstiel incision was made and carried down through the subcutaneous tissue to the fascia. Fascial incision was made and extended transversely with the Mayo scissors. The fascia was separated from the underlying rectus tissue superiorly and inferiorly. The peritoneum was identified and entered bluntly. Peritoneal incision was extended longitudinally. The utero-vesical peritoneal reflection was incised transversely and a bladder flap was created digitally.  A low transverse hysterotomy was made. The fetus was delivered atraumatically. The umbilical cord was clamped x2 and cut and the infant was handed to the awaiting pediatricians. The placenta was removed intact and appeared normal with a 3-vessel cord.  The  uterus was exteriorized and cleared of all clot and debris. The hysterotomy was closed with running sutures of 0 Vicryl suture. A second imbricating layer was placed with the same suture. Excellent hemostasis was observed. The uterus was returned to the abdomen. The pelvis was irrigated and again, excellent hemostasis was noted.  The On Q Pain pump System was then placed.  Trocars were placed through the abdominal wall into the subfascial space and these were used to thread the silver soaker cathaters into place.The rectus fascia was then reapproximated with running sutures of Maxon, with careful placement not to incorporate the cathaters. Subcutaneous tissues are then irrigated with saline and hemostasis assured.  Skin is then closed with 4-0 vicryl suture in a subcuticular fashion followed by skin adhesive. The cathaters are flushed each with 5 mL of Bupivicaine and stabilized into place with dressing. Instrument, sponge, and needle counts were correct prior to the abdominal closure and at the conclusion of the case.  The patient tolerated the procedure well and was transferred to the recovery room in stable condition.

## 2017-05-27 NOTE — H&P (Signed)
OB History & Physical   History of Present Illness:  Chief Complaint:  Headache and contractions HPI:  Megan Kramer is a 26 y.o. G74P1001 female with EDC=06/06/2017 at [redacted]w[redacted]d dated by a 6 week ultrasound.  Her pregnancy has been complicated by history of prior Cesarean section for severe preeclampsia and possible HSV infection, obesity, anxiety, postpartum depression. This pregnancy has also been complicated by non obstructing kidney stones and left hydroureter.  She presents to L&D for evaluation of sudden onset frontal headache and blurred vision this afternoon accompanied by nausea. She has also been cramping and contracting all day. Prior to arrival her contractions were q6 min apart. She denies sinus congestion, fever, CP, or RUQ pain. Has some SOB or a "heaviness on her chest". Baby has been active, No vaginal bleeding or leakage of fluid. Last ate at 1130 this AM.    Prenatal care site: Prenatal care at [facility]. Bottle feeding. Received TDAP 04/25/2017. Nexplanon for [facility].      Maternal Medical History:   Past Medical History:  Diagnosis Date  . Anxiety   . Herpes   . Hypertension    pregnancy induced  . Kidney stones   . Obesity (BMI 30-39.9)   . Postpartum depression associated with first pregnancy   . Second pregnancy     Past Surgical History:  Procedure Laterality Date  . CESAREAN SECTION  12/21/2014   [place]    Allergies  Allergen Reactions  . Codeine Nausea Only  . Latex Rash    Prior to Admission medications   Medication Sig Start Date End Date Taking? Authorizing Provider  Prenatal Vit-Fe Fumarate-FA (PRENATAL MULTIVITAMIN) TABS tablet Take 1 tablet by mouth daily at 12 noon.   Yes [provider]                               Has not been taking Valtrex.       Social History: She  reports that she has never smoked. She has never used smokeless tobacco. She reports that she does not drink alcohol or use  drugs.  Family History: family history includes Breast cancer in her maternal grandmother; Hypertension in her mother.   Review of Systems: Negative x 10 systems reviewed except as noted in the HPI.      Physical Exam:  Vital Signs: BP (!) 154/96   Pulse (!) 112   Temp 98.3 F (36.8 C) (Oral)   Resp 18   Ht 5\' 1"  (1.549 m)   Wt 85.3 kg (188 lb)   SpO2 100%   BMI 35.52 kg/m   Patient Vitals for the past 24 hrs:  BP Temp Temp src Pulse Resp SpO2 Height Weight  05/27/17 1929 (!) 154/96 - - (!) 112 - - - -  05/27/17 1848 (!) 142/72 - - (!) 114 - - - -  05/27/17 1833 133/86 - - (!) 105 - - - -  05/27/17 1824 (!) 133/91 - - (!) 109 - - - -  05/27/17 1748 123/71 - - (!) 102 - - - -  05/27/17 1733 (!) 149/75 - - (!) 111 - - - -  05/27/17 1718 (!) 141/81 - - (!) 109 - - - -  05/27/17 1702 134/86 98.3 F (36.8 C) Oral (!) 106 18 100 % 5\' 1"  (1.549 m) 85.3 kg (188 lb)  05/27/17 1651 (!) 129/93 - - (!) 104 - - - -  General: no acute distress. photophobic HEENT: normocephalic, atraumatic Heart: regular rate & rhythm.  No murmurs/rubs/gallops Lungs: clear to auscultation bilaterally Abdomen: soft, gravid, non-tender;  EFW: 7#10 oz Pelvic:   External: Normal external female genitalia. No lesions seen  Cervix: Dilation: Closed / Effacement (%): 70 / Station: -3   Extremities: non-tender, symmetric, +1 edema bilaterally.  DTRs: +1 to +2 Neurologic: Alert & oriented x 3.    Pertinent Results:  Prenatal Labs: Blood type/Rh A positive  Antibody screen negative  Rubella Varicella Immune immune  RPR Non reactive  HBsAg negative  HIV Non reactive  GC negative  Chlamydia negative  Genetic screening Not done  1 hour GTT 113  3 hour GTT NA  GBS positive on 5/18   Baseline FHR: initially baseline 130  With accelerations to 180s, moderate variability, occasional variables lasting 20 sec Toco: irregular contractions: q3-10+ min apart  Results for orders placed or performed during  the hospital encounter of 05/27/17 (from the past 24 hour(s))  Protein / creatinine ratio, urine     Status: Abnormal   Collection Time: 05/27/17  5:26 PM  Result Value Ref Range   Creatinine, Urine 165 mg/dL   Total Protein, Urine 55 mg/dL   Protein Creatinine Ratio 0.33 (H) 0.00 - 0.15 mg/mg[Cre]  Type and screen [facility]     Status: None   Collection Time: 05/27/17  6:41 PM  Result Value Ref Range   ABO/RH(D) A POS    Antibody Screen NEG    Sample Expiration 05/30/2017   Comprehensive metabolic panel     Status: Abnormal   Collection Time: 05/27/17  6:41 PM  Result Value Ref Range   Sodium 133 (L) 135 - 145 mmol/L   Potassium 3.8 3.5 - 5.1 mmol/L   Chloride 101 101 - 111 mmol/L   CO2 24 22 - 32 mmol/L   Glucose, Bld 74 65 - 99 mg/dL   BUN 7 6 - 20 mg/dL   Creatinine, Ser 4.09 (L) 0.44 - 1.00 mg/dL   Calcium 9.0 8.9 - 81.1 mg/dL   Total Protein 6.6 6.5 - 8.1 g/dL   Albumin 3.0 (L) 3.5 - 5.0 g/dL   AST 24 15 - 41 U/L   ALT 11 (L) 14 - 54 U/L   Alkaline Phosphatase 202 (H) 38 - 126 U/L   Total Bilirubin 0.5 0.3 - 1.2 mg/dL   GFR calc non Af Amer >60 >60 mL/min   GFR calc Af Amer >60 >60 mL/min   Anion gap 8 5 - 15  CBC     Status: Abnormal   Collection Time: 05/27/17  6:41 PM  Result Value Ref Range   WBC 8.4 3.6 - 11.0 K/uL   RBC 4.00 3.80 - 5.20 MIL/uL   Hemoglobin 11.7 (L) 12.0 - 16.0 g/dL   HCT 91.4 (L) 78.2 - 95.6 %   MCV 85.8 80.0 - 100.0 fL   MCH 29.2 26.0 - 34.0 pg   MCHC 34.0 32.0 - 36.0 g/dL   RDW 21.3 08.6 - 57.8 %   Platelets 283 150 - 440 K/uL    Assessment:  Megan Kramer is a 26 y.o. G67P1001 female at [redacted]w[redacted]d with preeclampsia without severe features Previous Cesarean section  Plan:  1. Admit to Labor & Delivery -Notified Dr Tiburcio Pea of above information. Per Dr Tiburcio Pea will prepare for Cesarean section tonight. Patient informed of diagnosis and plan to do Cesarean section tonight. 2. NPO  3. Consents obtained. 4. Notify  anesthesia, neonatology, and OR.   Farrel ConnersColleen Portland Sarinana  05/27/2017 8:03 PM

## 2017-05-27 NOTE — Discharge Instructions (Signed)

## 2017-05-28 ENCOUNTER — Inpatient Hospital Stay: Payer: 59

## 2017-05-28 ENCOUNTER — Encounter: Payer: Self-pay | Admitting: Obstetrics & Gynecology

## 2017-05-28 LAB — CBC
HEMATOCRIT: 34.3 % — AB (ref 35.0–47.0)
Hemoglobin: 11.7 g/dL — ABNORMAL LOW (ref 12.0–16.0)
MCH: 29.1 pg (ref 26.0–34.0)
MCHC: 34.2 g/dL (ref 32.0–36.0)
MCV: 85 fL (ref 80.0–100.0)
Platelets: 251 10*3/uL (ref 150–440)
RBC: 4.03 MIL/uL (ref 3.80–5.20)
RDW: 14.7 % — ABNORMAL HIGH (ref 11.5–14.5)
WBC: 11 10*3/uL (ref 3.6–11.0)

## 2017-05-28 LAB — RPR: RPR Ser Ql: NONREACTIVE

## 2017-05-28 MED ORDER — LACTATED RINGERS IV SOLN: INTRAVENOUS | Status: DC

## 2017-05-28 MED ORDER — OXYTOCIN 40 UNITS IN LACTATED RINGERS INFUSION - SIMPLE MED
2.5000 [IU]/h | INTRAVENOUS | Status: DC
  Administered 2017-05-28: 2.5 [IU]/h via INTRAVENOUS
  Filled 2017-05-28: qty 1000

## 2017-05-28 MED ORDER — MORPHINE SULFATE (PF) 2 MG/ML IV SOLN: 1.0000 mg | INTRAVENOUS | Status: DC | PRN

## 2017-05-28 MED ORDER — ZOLPIDEM TARTRATE 5 MG PO TABS: 5.0000 mg | ORAL_TABLET | Freq: Every evening | ORAL | Status: DC | PRN

## 2017-05-28 MED ORDER — TETANUS-DIPHTH-ACELL PERTUSSIS 5-2.5-18.5 LF-MCG/0.5 IM SUSP: 0.5000 mL | Freq: Once | INTRAMUSCULAR | Status: DC

## 2017-05-28 MED ORDER — WITCH HAZEL-GLYCERIN EX PADS: 1.0000 "application " | MEDICATED_PAD | CUTANEOUS | Status: DC | PRN

## 2017-05-28 MED ORDER — SIMETHICONE 80 MG PO CHEW
80.0000 mg | CHEWABLE_TABLET | Freq: Three times a day (TID) | ORAL | Status: DC
Start: 2017-05-28 — End: 2017-05-30
  Administered 2017-05-28 – 2017-05-30 (×6): 80 mg via ORAL
  Filled 2017-05-28 (×6): qty 1

## 2017-05-28 MED ORDER — PRENATAL MULTIVITAMIN CH
1.0000 | ORAL_TABLET | Freq: Every day | ORAL | Status: DC
  Administered 2017-05-28 – 2017-05-30 (×3): 1 via ORAL
  Filled 2017-05-28 (×3): qty 1

## 2017-05-28 MED ORDER — SIMETHICONE 80 MG PO CHEW
80.0000 mg | CHEWABLE_TABLET | ORAL | Status: DC
  Administered 2017-05-28 – 2017-05-29 (×3): 80 mg via ORAL
  Filled 2017-05-28 (×3): qty 1

## 2017-05-28 MED ORDER — DIPHENHYDRAMINE HCL 25 MG PO CAPS: 25.0000 mg | ORAL_CAPSULE | Freq: Four times a day (QID) | ORAL | Status: DC | PRN

## 2017-05-28 MED ORDER — MENTHOL 3 MG MT LOZG
1.0000 | LOZENGE | OROMUCOSAL | Status: DC | PRN
  Filled 2017-05-28: qty 9

## 2017-05-28 MED ORDER — OXYCODONE-ACETAMINOPHEN 5-325 MG PO TABS
1.0000 | ORAL_TABLET | ORAL | Status: DC | PRN
  Administered 2017-05-29 – 2017-05-30 (×2): 1 via ORAL
  Filled 2017-05-28 (×3): qty 1

## 2017-05-28 MED ORDER — KETOROLAC TROMETHAMINE 30 MG/ML IJ SOLN
30.0000 mg | Freq: Four times a day (QID) | INTRAMUSCULAR | Status: AC
  Administered 2017-05-28 (×4): 30 mg via INTRAVENOUS
  Filled 2017-05-28 (×4): qty 1

## 2017-05-28 MED ORDER — SIMETHICONE 80 MG PO CHEW: 80.0000 mg | CHEWABLE_TABLET | ORAL | Status: DC | PRN

## 2017-05-28 MED ORDER — OXYCODONE-ACETAMINOPHEN 5-325 MG PO TABS
2.0000 | ORAL_TABLET | ORAL | Status: DC | PRN
  Administered 2017-05-28 – 2017-05-30 (×9): 2 via ORAL
  Filled 2017-05-28 (×9): qty 2

## 2017-05-28 MED ORDER — SENNOSIDES-DOCUSATE SODIUM 8.6-50 MG PO TABS
2.0000 | ORAL_TABLET | ORAL | Status: DC
  Administered 2017-05-28 – 2017-05-29 (×3): 2 via ORAL
  Filled 2017-05-28 (×3): qty 2

## 2017-05-28 MED ORDER — DIBUCAINE 1 % RE OINT: 1.0000 "application " | TOPICAL_OINTMENT | RECTAL | Status: DC | PRN

## 2017-05-28 MED ORDER — COCONUT OIL OIL: 1.0000 "application " | TOPICAL_OIL | Status: DC | PRN

## 2017-05-28 NOTE — Progress Notes (Signed)
Patient ID: Megan Kramer, female   DOB: 03/21/1991, 26 y.o.   MRN: [id number]  Obstetric Postpartum/PostOperative Daily Progress Note Subjective:  26 y.o. G2P1001 POD#1 status post repeat cesarean delivery.  She is ambulating, is tolerating po, is voiding spontaneously.  Her pain is well controlled on PO pain medications. Her lochia is less than menses.   Medications SCHEDULED MEDICATIONS  . acetaminophen      . ketorolac  30 mg Intravenous Q6H  . prenatal multivitamin  1 tablet Oral Q1200  . senna-docusate  2 tablet Oral Q24H  . simethicone  80 mg Oral TID PC  . simethicone  80 mg Oral Q24H  . Tdap  0.5 mL Intramuscular Once    MEDICATION INFUSIONS  . lactated ringers    . naLOXone [facility](NARCAN) adult infusion for PRURITIS    . oxytocin 2.5 Units/hr (05/28/17 0054)    PRN MEDICATIONS  acetaminophen, coconut oil, witch hazel-glycerin **AND** dibucaine, diphenhydrAMINE **OR** diphenhydrAMINE, diphenhydrAMINE, ketorolac **OR** ketorolac, menthol-cetylpyridinium, meperidine (DEMEROL) injection, morphine injection, nalbuphine **OR** nalbuphine, naLOXone (NARCAN) adult infusion for PRURITIS, naloxone **AND** sodium chloride flush, ondansetron (ZOFRAN) IV, oxyCODONE-acetaminophen, oxyCODONE-acetaminophen, simethicone, zolpidem    Objective:   Vitals:   05/28/17 0102 05/28/17 0203 05/28/17 0254 05/28/17 0804  BP: 136/80 (!) 110/48 106/63 119/60  Pulse: 98 83 85 65  Resp: 18 18 16 18   Temp: 98 F (36.7 C) 98.4 F (36.9 C) 98.1 F (36.7 C) 98.3 F (36.8 C)  TempSrc: Oral Oral Oral Oral  SpO2: 97% 98% 99% 98%  Weight:      Height:        Current Vital Signs 24h Vital Sign Ranges  T 98.3 F (36.8 C) Temp  Avg: 98.4 F (36.9 C)  Min: 98 F (36.7 C)  Max: 99.7 F (37.6 C)  BP 119/60 BP  Min: 106/63  Max: 154/96  HR 65 Pulse  Avg: 94.7  Min: 63  Max: 114  RR 18 Resp  Avg: 15.6  Min: 11  Max: 25  SaO2 98 % Not Delivered SpO2  Avg: 98.3 %  Min: 95 %  Max: 100 %       24 Hour I/O  Current Shift I/O  Time Ins Outs 06/05 0701 - 06/06 0700 In: 500 [I.V.:500] Out: 1140 [Urine:40] No intake/output data recorded.  General: NAD Pulmonary: no increased work of breathing Abdomen: non-distended, non-tender, fundus firm at level of umbilicus Inc: Clean/dry/intact Extremities: no edema, no erythema, no tenderness  Labs:   Recent Labs Lab 05/27/17 1841 05/28/17 0435  WBC 8.4 11.0  HGB 11.7* 11.7*  HCT 34.3* 34.3*  PLT 283 251     Assessment:   26 y.o. G2P1001 postoperative day # 1, s/p repeat cesarean section, doing well  Plan:  1) Acute blood loss anemia - hemodynamically stable and asymptomatic - po ferrous sulfate  2) A POS / Rubella Immune (06/05 0000)/ Varicella Immune  3) TDAP status up to date, received on 04/25/17  4) formula feeding /Contraception = nexplanon   5) left moderate hydroureteronephrosis, likely left ureterolithiasis - Stone protocol (non contrast) CT scan while in-house, per urology recommendations.  6) Disposition - home POD 2-3  Conard NovakStephen D. Mattthew Ziomek, MD 05/28/2017 11:34 AM

## 2017-05-28 NOTE — Anesthesia Postprocedure Evaluation (Signed)
Anesthesia Post Note  Patient: Megan Kramer  Procedure(s) Performed: Procedure(s) (LRB): CESAREAN SECTION (N/A)  Patient location during evaluation: Mother Baby Anesthesia Type: Spinal Level of consciousness: awake, awake and alert and oriented Pain management: pain level controlled Vital Signs Assessment: post-procedure vital signs reviewed and stable Respiratory status: spontaneous breathing, nonlabored ventilation and respiratory function stable Cardiovascular status: stable Anesthetic complications: no     Last Vitals:  Vitals:   05/28/17 0203 05/28/17 0254  BP: (!) 110/48 106/63  Pulse: 83 85  Resp: 18 16  Temp: 36.9 C 36.7 C    Last Pain:  Vitals:   05/28/17 0546  TempSrc:   PainSc: Asleep                 Casey Burkitthuy Haleemah Buckalew

## 2017-05-29 MED ORDER — IBUPROFEN 400 MG PO TABS
400.0000 mg | ORAL_TABLET | Freq: Four times a day (QID) | ORAL | Status: DC | PRN
  Administered 2017-05-29 – 2017-05-30 (×3): 400 mg via ORAL
  Filled 2017-05-29 (×3): qty 1

## 2017-05-29 MED ORDER — CITALOPRAM HYDROBROMIDE 20 MG PO TABS
20.0000 mg | ORAL_TABLET | Freq: Every day | ORAL | Status: DC
  Administered 2017-05-29 – 2017-05-30 (×2): 20 mg via ORAL
  Filled 2017-05-29 (×2): qty 1

## 2017-05-29 NOTE — Lactation Note (Signed)
This note was copied from a baby's chart. Lactation Consultation Note  Patient Name: Megan Kramer Today's Date: 05/29/2017 Reason for consult: Initial assessment   Maternal Data    Mother does not want to breast feed, care encase of engorgement  has been discussed.                    Lactation Tools Discussed/Used     Consult Status      Trudee GripCarolyn P Kyani Simkin 05/29/2017, 12:17 PM

## 2017-05-29 NOTE — Progress Notes (Signed)
Admit Date: 05/27/2017 Today's Date: 05/29/2017  Subjective: Postpartum Day 2: Cesarean Delivery Patient reports tolerating PO, + flatus and no problems voiding.    Objective: Vital signs in last 24 hours: Temp:  [97.4 F (36.3 C)-98.5 F (36.9 C)] 98.5 F (36.9 C) (06/07 0738) Pulse Rate:  [75-98] 97 (06/07 0907) Resp:  [18-20] 20 (06/07 0738) BP: (114-146)/(67-90) 141/84 (06/07 0907) SpO2:  [97 %-100 %] 100 % (06/07 0738)  Physical Exam:  General: alert, cooperative and no distress Lochia: appropriate Uterine Fundus: firm Incision: healing well DVT Evaluation: No evidence of DVT seen on physical exam. Negative Homan's sign.   Recent Labs  05/27/17 1841 05/28/17 0435  HGB 11.7* 11.7*  HCT 34.3* 34.3*    Assessment/Plan: Status post Cesarean section. Doing well postoperatively.  Continue current care. Infant has been in SCN, improving, to room today  Celexa for PPD prephylaxis as she had with last pregnancy and wants to avoid same development Motrin for pain along w percocet prn Plans Nexplanon BP mostly normal, cont to monitor CT reviewed, Right sided 11mm stable stone, no hydro Bottle feeding  Letitia LibraRobert Paul Tylah Mancillas 05/29/2017, 9:29 AM

## 2017-05-30 ENCOUNTER — Telehealth: Payer: Self-pay | Admitting: Obstetrics and Gynecology

## 2017-05-30 MED ORDER — IBUPROFEN 600 MG PO TABS: 600.0000 mg | ORAL_TABLET | Freq: Four times a day (QID) | ORAL | 3 refills | Status: DC | PRN

## 2017-05-30 MED ORDER — OXYCODONE-ACETAMINOPHEN 5-325 MG PO TABS: 1.0000 | ORAL_TABLET | ORAL | 0 refills | Status: DC | PRN

## 2017-05-30 MED ORDER — CITALOPRAM HYDROBROMIDE 20 MG PO TABS: 20.0000 mg | ORAL_TABLET | Freq: Every day | ORAL | 12 refills | Status: DC

## 2017-05-30 NOTE — Telephone Encounter (Signed)
Pharmacy aware of Dx. G89.18 Post op pain

## 2017-05-30 NOTE — Telephone Encounter (Signed)
Mandi from [facility] needing an diagnose code on a prescription. Pt is a pharmacy. Please call # [phone]

## 2017-05-30 NOTE — Progress Notes (Signed)
Patient discharged home with infant and spouse. Discharge instructions, prescriptions and follow up appointment given to and reviewed with patient and spouse. Patient verbalized understanding. Escorted out via wheelchair by auxiliary.  

## 2017-06-02 ENCOUNTER — Encounter: Payer: Managed Care, Other (non HMO) | Admitting: Obstetrics & Gynecology

## 2017-06-03 ENCOUNTER — Encounter: Payer: BLUE CROSS/BLUE SHIELD | Admitting: Obstetrics & Gynecology

## 2017-06-03 ENCOUNTER — Encounter: Payer: Managed Care, Other (non HMO) | Admitting: Obstetrics & Gynecology

## 2017-06-04 ENCOUNTER — Encounter: Payer: Self-pay | Admitting: Obstetrics and Gynecology

## 2017-06-04 ENCOUNTER — Ambulatory Visit (INDEPENDENT_AMBULATORY_CARE_PROVIDER_SITE_OTHER): Payer: Managed Care, Other (non HMO) | Admitting: Obstetrics and Gynecology

## 2017-06-04 ENCOUNTER — Other Ambulatory Visit: Payer: 59

## 2017-06-04 ENCOUNTER — Other Ambulatory Visit: Payer: BLUE CROSS/BLUE SHIELD

## 2017-06-04 VITALS — BP 148/94 | HR 60 | Wt 170.0 lb

## 2017-06-04 DIAGNOSIS — Z4889 Encounter for other specified surgical aftercare: Secondary | ICD-10-CM

## 2017-06-04 NOTE — Progress Notes (Signed)
      Postoperative Follow-up Patient presents post op from [facility]  Subjective: Patient reports marked improvement in her preop symptoms. Eating a regular diet without difficulty. Pain is controlled with current analgesics. Medications being used: ibuprofen (OTC) and narcotic analgesics including percocet.  Activity: normal activities of daily living.  Objective: Vitals:   06/04/17 1103  BP: (!) 148/94  Pulse: 60   Gen: NAD Abdomen: soft, non-tender, non-distended incision D/C/I healing well  Assessment: 26 y.o. s/p RLTCS stable  Plan: Patient has done well after surgery with no apparent complications.  I have discussed the post-operative course to date, and the expected progress moving forward.  The patient understands what complications to be concerned about.  I will see the patient in routine follow up, or sooner if needed.    Activity plan: No heavy lifting. F/U 6 week postpartum visit and nexplanon  Vena Austriandreas Riddick Nuon 06/04/2017, 11:16 AM

## 2017-06-05 ENCOUNTER — Inpatient Hospital Stay: Admission: RE | Admit: 2017-06-05 | Payer: 59 | Source: Ambulatory Visit | Admitting: Obstetrics & Gynecology

## 2017-06-05 ENCOUNTER — Encounter: Admission: RE | Payer: Self-pay | Source: Ambulatory Visit

## 2017-06-05 SURGERY — Surgical Case
Anesthesia: Choice

## 2017-06-11 ENCOUNTER — Ambulatory Visit: Payer: 59 | Admitting: Urology

## 2017-06-13 ENCOUNTER — Telehealth: Payer: Self-pay

## 2017-06-13 NOTE — Telephone Encounter (Signed)
Pt is 2 wks pp & was put on Celexa immediately for PPD. Pt is having bad anxiety attacks. Cb#[phone].

## 2017-06-13 NOTE — Telephone Encounter (Signed)
Please advise 

## 2017-06-18 ENCOUNTER — Encounter: Payer: Self-pay | Admitting: Obstetrics & Gynecology

## 2017-06-18 ENCOUNTER — Ambulatory Visit (INDEPENDENT_AMBULATORY_CARE_PROVIDER_SITE_OTHER): Payer: Managed Care, Other (non HMO) | Admitting: Obstetrics & Gynecology

## 2017-06-18 VITALS — BP 130/80 | HR 73 | Ht 61.0 in | Wt 164.0 lb

## 2017-06-18 DIAGNOSIS — O34219 Maternal care for unspecified type scar from previous cesarean delivery: Secondary | ICD-10-CM

## 2017-06-18 NOTE — Progress Notes (Signed)
  Postoperative Follow-up Patient presents post op from CS for repeat, gest HTN, 3 weeks ago.  Subjective: Patient reports marked improvement in her preop symptoms. Eating a regular diet without difficulty. The patient is not having any pain.  Activity: normal activities of daily living. Patient reports vaginal sx's of None  Objective: BP 130/80   Pulse 73   Ht 5\' 1"  (1.549 m)   Wt 164 lb (74.4 kg)   BMI 30.99 kg/m  Physical Exam  Constitutional: She is oriented to person, place, and time. She appears well-developed and well-nourished. No distress.  Cardiovascular: Normal rate.   Pulmonary/Chest: Effort normal.  Abdominal: Soft. She exhibits no distension. There is no tenderness.  Incision Healing Well   Musculoskeletal: Normal range of motion.  Neurological: She is alert and oriented to person, place, and time. No cranial nerve deficit.  Skin: Skin is warm and dry.  Psychiatric: She has a normal mood and affect.    Assessment: s/p :  cesarean section stable  Plan: Patient has done well after surgery with no apparent complications.  I have discussed the post-operative course to date, and the expected progress moving forward.  The patient understands what complications to be concerned about.  I will see the patient in routine follow up, or sooner if needed.    Activity plan: No heavy lifting. Plan Nexplanon nv Celexa for PPD and anxiety, prior h/o anxiety as well   Referral/PCP if becomes chronic need for meds/therapy  Megan LibraRobert Paul Harris 06/18/2017, 1:43 PM

## 2017-07-09 ENCOUNTER — Ambulatory Visit (INDEPENDENT_AMBULATORY_CARE_PROVIDER_SITE_OTHER): Payer: Self-pay | Admitting: Obstetrics & Gynecology

## 2017-07-09 ENCOUNTER — Encounter: Payer: Self-pay | Admitting: Obstetrics & Gynecology

## 2017-07-09 VITALS — BP 102/70 | HR 50 | Ht 61.0 in | Wt 166.0 lb

## 2017-07-09 DIAGNOSIS — O34219 Maternal care for unspecified type scar from previous cesarean delivery: Secondary | ICD-10-CM

## 2017-07-09 DIAGNOSIS — F419 Anxiety disorder, unspecified: Secondary | ICD-10-CM

## 2017-07-09 MED ORDER — BUSPIRONE HCL 7.5 MG PO TABS: 7.5000 mg | ORAL_TABLET | Freq: Two times a day (BID) | ORAL | 6 refills | Status: DC

## 2017-07-09 MED ORDER — NORGESTIMATE-ETH ESTRADIOL 0.25-35 MG-MCG PO TABS: 1.0000 | ORAL_TABLET | Freq: Every day | ORAL | 11 refills | Status: DC

## 2017-07-09 NOTE — Patient Instructions (Signed)
Buspirone tablets What is this medicine? BUSPIRONE (byoo SPYE rone) is used to treat anxiety disorders. This medicine may be used for other purposes; ask your health care provider or pharmacist if you have questions. COMMON BRAND NAME(S): BuSpar What should I tell my health care provider before I take this medicine? They need to know if you have any of these conditions: -kidney or liver disease -an unusual or allergic reaction to buspirone, other medicines, foods, dyes, or preservatives -pregnant or trying to get pregnant -breast-feeding How should I use this medicine? Take this medicine by mouth with a glass of water. Follow the directions on the prescription label. You may take this medicine with or without food. To ensure that this medicine always works the same way for you, you should take it either always with or always without food. Take your doses at regular intervals. Do not take your medicine more often than directed. Do not stop taking except on the advice of your doctor or health care professional. Talk to your pediatrician regarding the use of this medicine in children. Special care may be needed. Overdosage: If you think you have taken too much of this medicine contact a poison control center or emergency room at once. NOTE: This medicine is only for you. Do not share this medicine with others. What if I miss a dose? If you miss a dose, take it as soon as you can. If it is almost time for your next dose, take only that dose. Do not take double or extra doses. What may interact with this medicine? Do not take this medicine with any of the following medications: -linezolid -MAOIs like Carbex, Eldepryl, Marplan, Nardil, and Parnate -methylene blue -procarbazine This medicine may also interact with the following medications: -diazepam -digoxin -diltiazem -erythromycin -grapefruit juice -haloperidol -medicines for mental depression or mood problems -medicines for seizures like  carbamazepine, phenobarbital and phenytoin -nefazodone -other medications for anxiety -rifampin -ritonavir -some antifungal medicines like itraconazole, ketoconazole, and voriconazole -verapamil -warfarin This list may not describe all possible interactions. Give your health care provider a list of all the medicines, herbs, non-prescription drugs, or dietary supplements you use. Also tell them if you smoke, drink alcohol, or use illegal drugs. Some items may interact with your medicine. What should I watch for while using this medicine? Visit your doctor or health care professional for regular checks on your progress. It may take 1 to 2 weeks before your anxiety gets better. You may get drowsy or dizzy. Do not drive, use machinery, or do anything that needs mental alertness until you know how this drug affects you. Do not stand or sit up quickly, especially if you are an older patient. This reduces the risk of dizzy or fainting spells. Alcohol can make you more drowsy and dizzy. Avoid alcoholic drinks. What side effects may I notice from receiving this medicine? Side effects that you should report to your doctor or health care professional as soon as possible: -blurred vision or other vision changes -chest pain -confusion -difficulty breathing -feelings of hostility or anger -muscle aches and pains -numbness or tingling in hands or feet -ringing in the ears -skin rash and itching -vomiting -weakness Side effects that usually do not require medical attention (report to your doctor or health care professional if they continue or are bothersome): -disturbed dreams, nightmares -headache -nausea -restlessness or nervousness -sore throat and nasal congestion -stomach upset This list may not describe all possible side effects. Call your doctor for medical advice about side   effects. You may report side effects to FDA at 1-800-FDA-1088. Where should I keep my medicine? Keep out of the reach  of children. Store at room temperature below 30 degrees C (86 degrees F). Protect from light. Keep container tightly closed. Throw away any unused medicine after the expiration date. NOTE: This sheet is a summary. It may not cover all possible information. If you have questions about this medicine, talk to your doctor, pharmacist, or health care provider.  2018 Elsevier/Gold Standard (2010-07-19 18:06:11)  

## 2017-07-09 NOTE — Progress Notes (Signed)
  OBSTETRICS POSTPARTUM CLINIC PROGRESS NOTE  Subjective:     Megan Kramer is a 26 y.o. [id number] female who presents for a postpartum visit. She is 6 weeks postpartum following a Term pregnancy and delivery by C-section.  I have fully reviewed the prenatal and intrapartum course. Anesthesia: spinal.  Postpartum course has been complicated by uncomplicated.  Baby is feeding by Bottle.  Bleeding: patient has  resumed menses.  Bowel function is normal. Bladder function is normal.  Patient is sexually active. Contraception method desired is OCP (estrogen/progesterone) and later Vasec.  Postpartum depression screening: anxiety mostly. Edinburgh 10.  The following portions of the patient's history were reviewed and updated as appropriate: allergies, current medications, past family history, past medical history, past social history, past surgical history and problem list.  Review of Systems Pertinent items are noted in HPI.  Objective:    BP 102/70   Pulse (!) 50   Ht 5\' 1"  (1.549 m)   Wt 166 lb (75.3 kg)   LMP 07/08/2017   BMI 31.37 kg/m   General:  alert and no distress   Breasts:  inspection negative, no nipple discharge or bleeding, no masses or nodularity palpable  Lungs: clear to auscultation bilaterally  Heart:  regular rate and rhythm, S1, S2 normal, no murmur, click, rub or gallop  Abdomen: soft, non-tender; bowel sounds normal; no masses,  no organomegaly.  Well healed Pfannenstiel incision   Vulva:  normal  Vagina: normal vagina, no discharge, exudate, lesion, or erythema  Cervix:  no cervical motion tenderness and no lesions  Corpus: normal size, contour, position, consistency, mobility, non-tender  Adnexa:  normal adnexa and no mass, fullness, tenderness  Rectal Exam: Not performed.          Assessment:  Post Partum Care visit 1. Previous cesarean section complicating pregnancy, antepartum condition or complication 2. Contraception disucssed. Plans Vasec.  Plans  OCP until done.  Bottle feeding. 3. Anxiety, change meds to Buspar; discussed pros and cons  Plan:  See orders and Patient Instructions Contraceptive counseling for oral contraceptives (estrogen/progesterone) Resume all normal activities Follow up in: 4 weeks or as needed.   Annamarie MajorPaul Ethridge Sollenberger, MD, Merlinda FrederickFACOG [facility], [facility] 07/09/2017  10:11 AM

## 2017-08-08 ENCOUNTER — Ambulatory Visit (INDEPENDENT_AMBULATORY_CARE_PROVIDER_SITE_OTHER): Payer: Self-pay | Admitting: Obstetrics & Gynecology

## 2017-08-08 ENCOUNTER — Encounter: Payer: Self-pay | Admitting: Obstetrics & Gynecology

## 2017-08-08 VITALS — BP 120/80 | HR 62 | Ht 61.0 in | Wt 164.0 lb

## 2017-08-08 DIAGNOSIS — F53 Postpartum depression: Secondary | ICD-10-CM

## 2017-08-08 DIAGNOSIS — O99345 Other mental disorders complicating the puerperium: Principal | ICD-10-CM

## 2017-08-08 MED ORDER — BUPROPION HCL ER (XL) 150 MG PO TB24: 150.0000 mg | ORAL_TABLET | Freq: Every day | ORAL | 5 refills | Status: DC

## 2017-08-08 NOTE — Progress Notes (Signed)
  History of Present Illness:  Megan Kramer is a 26 y.o. who was started on  Buspar approximately 4 weeks ago. Since that time, she states that her symptoms show no change.  More depression, no change in anxiety. More stress recently. Has h/o mood d/o.  PMHx: She  has a past medical history of Anxiety; Herpes; Hypertension; Kidney stones; Obesity (BMI 30-39.9); Postpartum depression associated with first pregnancy; and Second pregnancy. Also,  has a past surgical history that includes Cesarean section (12/21/2014) and Cesarean section (N/A, 05/27/2017)., family history includes Breast cancer in her maternal grandmother; Hypertension in her mother.,  reports that she has never smoked. She has never used smokeless tobacco. She reports that she does not drink alcohol or use drugs. No outpatient prescriptions have been marked as taking for the 08/08/17 encounter (Office Visit) with Megan Mustard, MD.  . Also, is allergic to codeine and latex..  Review of Systems  All other systems reviewed and are negative.   Physical Exam:  BP 120/80   Pulse 62   Ht 5\' 1"  (1.549 m)   Wt 164 lb (74.4 kg)   LMP 08/04/2017   BMI 30.99 kg/m  Body mass index is 30.99 kg/m. Constitutional: Well nourished, well developed female in no acute distress.  Abdomen: diffusely non tender to palpation, non distended, and no masses, hernias Neuro: Grossly intact Psych:  Normal mood and affect.    Assessment:  Problem List Items Addressed This Visit      Other   Post partum depression - Primary   Relevant Medications   buPROPion (WELLBUTRIN XL) 150 MG 24 hr tablet   Other Relevant Orders   Ambulatory referral to Psychiatry     Medication treatment is going poorly for her PPD/Anxiety.  Plan: She will undergo discontinue Buspar and begin Wellbutrin in her medical therapy. Alos, referral for long term treatment/ counseling She was amenable to this plan and we will see her back for PRN.  Megan Major, MD,  [facility], [facility] 08/08/2017  11:24 AM

## 2017-08-08 NOTE — Patient Instructions (Signed)
       Bupropion tablets (Depression/Mood Disorders) What is this medicine? BUPROPION (byoo PROE pee on) is used to treat depression. This medicine may be used for other purposes; ask your health care provider or pharmacist if you have questions. COMMON BRAND NAME(S): Wellbutrin What should I tell my health care provider before I take this medicine? They need to know if you have any of these conditions: -an eating disorder, such as anorexia or bulimia -bipolar disorder or psychosis -diabetes or high blood sugar, treated with medication -glaucoma -heart disease, previous heart attack, or irregular heart beat -head injury or brain tumor -high blood pressure -kidney or liver disease -seizures -suicidal thoughts or a previous suicide attempt -Tourette's syndrome -weight loss -an unusual or allergic reaction to bupropion, other medicines, foods, dyes, or preservatives -breast-feeding -pregnant or trying to become pregnant How should I use this medicine? Take this medicine by mouth with a glass of water. Follow the directions on the prescription label. You can take it with or without food. If it upsets your stomach, take it with food. Take your medicine at regular intervals. Do not take your medicine more often than directed. Do not stop taking this medicine suddenly except upon the advice of your doctor. Stopping this medicine too quickly may cause serious side effects or your condition may worsen. A special MedGuide will be given to you by the pharmacist with each prescription and refill. Be sure to read this information carefully each time. Talk to your pediatrician regarding the use of this medicine in children. Special care may be needed. Overdosage: If you think you have taken too much of this medicine contact a poison control center or emergency room at once. NOTE: This medicine is only for you. Do not share this medicine with others. What if I miss a dose? If you miss a dose,  take it as soon as you can. If it is less than four hours to your next dose, take only that dose and skip the missed dose. Do not take double or extra doses. What may interact with this medicine? Do not take this medicine with any of the following medications: -linezolid -MAOIs like Azilect, Carbex, Eldepryl, Marplan, Nardil, and Parnate -methylene blue (injected into a vein) -other medicines that contain bupropion like Zyban This medicine may also interact with the following medications: -alcohol -certain medicines for anxiety or sleep -certain medicines for blood pressure like metoprolol, propranolol -certain medicines for depression or psychotic disturbances -certain medicines for HIV or AIDS like efavirenz, lopinavir, nelfinavir, ritonavir -certain medicines for irregular heart beat like propafenone, flecainide -certain medicines for Parkinson's disease like amantadine, levodopa -certain medicines for seizures like carbamazepine, phenytoin, phenobarbital -cimetidine -clopidogrel -cyclophosphamide -digoxin -furazolidone -isoniazid -nicotine -orphenadrine -procarbazine -steroid medicines like prednisone or cortisone -stimulant medicines for attention disorders, weight loss, or to stay awake -tamoxifen -theophylline -thiotepa -ticlopidine -tramadol -warfarin This list may not describe all possible interactions. Give your health care provider a list of all the medicines, herbs, non-prescription drugs, or dietary supplements you use. Also tell them if you smoke, drink alcohol, or use illegal drugs. Some items may interact with your medicine. What should I watch for while using this medicine? Tell your doctor if your symptoms do not get better or if they get worse. Visit your doctor or health care professional for regular checks on your progress. Because it may take several weeks to see the full effects of this medicine, it is important to continue your treatment as prescribed by    your doctor. Patients and their families should watch out for new or worsening thoughts of suicide or depression. Also watch out for sudden changes in feelings such as feeling anxious, agitated, panicky, irritable, hostile, aggressive, impulsive, severely restless, overly excited and hyperactive, or not being able to sleep. If this happens, especially at the beginning of treatment or after a change in dose, call your health care professional. Avoid alcoholic drinks while taking this medicine. Drinking excessive alcoholic beverages, using sleeping or anxiety medicines, or quickly stopping the use of these agents while taking this medicine may increase your risk for a seizure. Do not drive or use heavy machinery until you know how this medicine affects you. This medicine can impair your ability to perform these tasks. Do not take this medicine close to bedtime. It may prevent you from sleeping. Your mouth may get dry. Chewing sugarless gum or sucking hard candy, and drinking plenty of water may help. Contact your doctor if the problem does not go away or is severe. What side effects may I notice from receiving this medicine? Side effects that you should report to your doctor or health care professional as soon as possible: -allergic reactions like skin rash, itching or hives, swelling of the face, lips, or tongue -breathing problems -changes in vision -confusion -elevated mood, decreased need for sleep, racing thoughts, impulsive behavior -fast or irregular heartbeat -hallucinations, loss of contact with reality -increased blood pressure -redness, blistering, peeling or loosening of the skin, including inside the mouth -seizures -suicidal thoughts or other mood changes -unusually weak or tired -vomiting Side effects that usually do not require medical attention (report to your doctor or health care professional if they continue or are bothersome): -constipation -headache -loss of  appetite -nausea -tremors -weight loss This list may not describe all possible side effects. Call your doctor for medical advice about side effects. You may report side effects to FDA at 1-800-FDA-1088. Where should I keep my medicine? Keep out of the reach of children. Store at room temperature between 20 and 25 degrees C (68 and 77 degrees F), away from direct sunlight and moisture. Keep tightly closed. Throw away any unused medicine after the expiration date. NOTE: This sheet is a summary. It may not cover all possible information. If you have questions about this medicine, talk to your doctor, pharmacist, or health care provider.  2018 Elsevier/Gold Standard (2016-05-31 13:44:21)  

## 2017-12-19 ENCOUNTER — Ambulatory Visit: Payer: 59 | Admitting: Internal Medicine

## 2018-01-02 ENCOUNTER — Ambulatory Visit: Payer: BLUE CROSS/BLUE SHIELD | Admitting: Internal Medicine

## 2018-01-02 ENCOUNTER — Encounter: Payer: Self-pay | Admitting: Internal Medicine

## 2018-01-02 VITALS — BP 134/96 | HR 85 | Temp 98.5°F | Ht 61.25 in | Wt 153.1 lb

## 2018-01-02 DIAGNOSIS — R197 Diarrhea, unspecified: Secondary | ICD-10-CM

## 2018-01-02 DIAGNOSIS — B351 Tinea unguium: Secondary | ICD-10-CM

## 2018-01-02 DIAGNOSIS — L309 Dermatitis, unspecified: Secondary | ICD-10-CM

## 2018-01-02 DIAGNOSIS — Z13220 Encounter for screening for lipoid disorders: Secondary | ICD-10-CM

## 2018-01-02 DIAGNOSIS — N200 Calculus of kidney: Secondary | ICD-10-CM

## 2018-01-02 DIAGNOSIS — F419 Anxiety disorder, unspecified: Secondary | ICD-10-CM | POA: Diagnosis not present

## 2018-01-02 DIAGNOSIS — K5900 Constipation, unspecified: Secondary | ICD-10-CM

## 2018-01-02 DIAGNOSIS — Z1329 Encounter for screening for other suspected endocrine disorder: Secondary | ICD-10-CM | POA: Diagnosis not present

## 2018-01-02 DIAGNOSIS — D649 Anemia, unspecified: Secondary | ICD-10-CM

## 2018-01-02 DIAGNOSIS — R221 Localized swelling, mass and lump, neck: Secondary | ICD-10-CM | POA: Diagnosis not present

## 2018-01-02 DIAGNOSIS — R109 Unspecified abdominal pain: Secondary | ICD-10-CM | POA: Diagnosis not present

## 2018-01-02 DIAGNOSIS — Z1283 Encounter for screening for malignant neoplasm of skin: Secondary | ICD-10-CM

## 2018-01-02 LAB — URINALYSIS, ROUTINE W REFLEX MICROSCOPIC
Bilirubin Urine: NEGATIVE
Ketones, ur: NEGATIVE
Leukocytes, UA: NEGATIVE
Nitrite: NEGATIVE
PH: 6 (ref 5.0–8.0)
SPECIFIC GRAVITY, URINE: 1.025 (ref 1.000–1.030)
Total Protein, Urine: NEGATIVE
Urine Glucose: NEGATIVE
Urobilinogen, UA: 0.2 (ref 0.0–1.0)

## 2018-01-02 LAB — COMPREHENSIVE METABOLIC PANEL
ALT: 13 U/L (ref 0–35)
AST: 16 U/L (ref 0–37)
Albumin: 4.9 g/dL (ref 3.5–5.2)
Alkaline Phosphatase: 83 U/L (ref 39–117)
BILIRUBIN TOTAL: 0.6 mg/dL (ref 0.2–1.2)
BUN: 15 mg/dL (ref 6–23)
CHLORIDE: 99 meq/L (ref 96–112)
CO2: 28 meq/L (ref 19–32)
Calcium: 9.7 mg/dL (ref 8.4–10.5)
Creatinine, Ser: 0.67 mg/dL (ref 0.40–1.20)
GFR: 110.77 mL/min (ref >60.00)
Glucose, Bld: 89 mg/dL (ref 70–99)
Potassium: 4.3 mEq/L (ref 3.5–5.1)
Sodium: 134 mEq/L — ABNORMAL LOW (ref 135–145)
Total Protein: 8.5 g/dL — ABNORMAL HIGH (ref 6.0–8.3)

## 2018-01-02 LAB — TSH: TSH: 1.7 u[IU]/mL (ref 0.35–4.50)

## 2018-01-02 LAB — CBC WITH DIFFERENTIAL/PLATELET
BASOS ABS: 0 10*3/uL (ref 0.0–0.1)
Basophils Relative: 0.5 % (ref 0.0–3.0)
Eosinophils Absolute: 0 10*3/uL (ref 0.0–0.7)
Eosinophils Relative: 0.5 % (ref 0.0–5.0)
HCT: 47.7 % — ABNORMAL HIGH (ref 36.0–46.0)
Hemoglobin: 15.9 g/dL — ABNORMAL HIGH (ref 12.0–15.0)
Lymphocytes Relative: 37.6 % (ref 12.0–46.0)
Lymphs Abs: 2.5 10*3/uL (ref 0.7–4.0)
MCHC: 33.3 g/dL (ref 30.0–36.0)
MCV: 94.8 fl (ref 78.0–100.0)
Monocytes Absolute: 0.4 10*3/uL (ref 0.1–1.0)
Monocytes Relative: 6.5 % (ref 3.0–12.0)
NEUTROS ABS: 3.7 10*3/uL (ref 1.4–7.7)
Neutrophils Relative %: 54.9 % (ref 43.0–77.0)
PLATELETS: 394 10*3/uL (ref 150.0–400.0)
RBC: 5.03 Mil/uL (ref 3.87–5.11)
RDW: 13.3 % (ref 11.5–15.5)
WBC: 6.7 10*3/uL (ref 4.0–10.5)

## 2018-01-02 LAB — LIPID PANEL
Cholesterol: 266 mg/dL — ABNORMAL HIGH (ref 0–200)
HDL: 61.4 mg/dL (ref >39.00)
LDL CALC: 174 mg/dL — AB (ref 0–99)
NONHDL: 204.41
Total CHOL/HDL Ratio: 4
Triglycerides: 152 mg/dL — ABNORMAL HIGH (ref 0.0–149.0)
VLDL: 30.4 mg/dL (ref 0.0–40.0)

## 2018-01-02 LAB — T4, FREE: FREE T4: 0.93 ng/dL (ref 0.60–1.60)

## 2018-01-02 MED ORDER — TRIAMCINOLONE ACETONIDE 0.1 % EX CREA: 1.0000 "application " | TOPICAL_CREAM | Freq: Two times a day (BID) | CUTANEOUS | 0 refills | Status: DC

## 2018-01-02 NOTE — Patient Instructions (Addendum)
F/u in 2-3 weeks  Try diet changes  Increase fiber intake and water intake  Read the information below    Kidney Stones Kidney stones (urolithiasis) are solid, rock-like deposits that form inside of the organs that make urine (kidneys). A kidney stone may form in a kidney and move into the bladder, where it can cause intense pain and block the flow of urine. Kidney stones are created when high levels of certain minerals are found in the urine. They are usually passed through urination, but in some cases, medical treatment may be needed to remove them. What are the causes? Kidney stones may be caused by:  A condition in which certain glands produce too much parathyroid hormone (primary hyperparathyroidism), which causes too much calcium buildup in the blood.  Buildup of uric acid crystals in the bladder (hyperuricosuria). Uric acid is a chemical that the body produces when you eat certain foods. It usually exits the body in the urine.  Narrowing (stricture) of one or both of the tubes that drain urine from the kidneys to the bladder (ureters).  A kidney blockage that is present at birth (congenital obstruction).  Past surgery on the kidney or the ureters, such as gastric bypass surgery.  What increases the risk? The following factors make you more likely to develop kidney stones:  Having had a kidney stone in the past.  Having a family history of kidney stones.  Not drinking enough water.  Eating a diet that is high in protein, salt (sodium), or sugar.  Being overweight or obese.  What are the signs or symptoms? Symptoms of a kidney stone may include:  Nausea.  Vomiting.  Blood in the urine (hematuria).  Pain in the side of the abdomen, right below the ribs (flank pain). Pain usually spreads (radiates) to the groin.  Needing to urinate frequently or urgently.  How is this diagnosed? This condition may be diagnosed based on:  Your medical history.  A physical  exam.  Blood tests.  Urine tests.  CT scan.  Abdominal X-ray.  A procedure to examine the inside of the bladder (cystoscopy).  How is this treated? Treatment for kidney stones depends on the size, location, and makeup of the stones. Treatment may involve:  Analyzing your urine before and after you pass the stone through urination.  Being monitored at the hospital until you pass the stone through urination.  Increasing your fluid intake and decreasing the amount of calcium and protein in your diet.  A procedure to break up kidney stones in the bladder using: ? A focused beam of light (laser therapy). ? Shock waves (extracorporeal shock wave lithotripsy).  Surgery to remove kidney stones. This may be needed if you have severe pain or have stones that block your urinary tract.  Follow these instructions at home: Eating and drinking   Drink enough fluid to keep your urine clear or pale yellow. This will help you to pass the kidney stone.  If directed, change your diet. This may include: ? Limiting how much sodium you eat. ? Eating more fruits and vegetables. ? Limiting how much meat, poultry, fish, and eggs you eat.  Follow instructions from your health care provider about eating or drinking restrictions. General instructions  Collect urine samples as told by your health care provider. You may need to collect a urine sample: ? 24 hours after you pass the stone. ? 8-12 weeks after passing the kidney stone, and every 6-12 months after that.  Strain your urine  every time you urinate, for as long as directed. Use the strainer that your health care provider recommends.  Do not throw out the kidney stone after passing it. Keep the stone so it can be tested by your health care provider. Testing the makeup of your kidney stone may help prevent you from getting kidney stones in the future.  Take over-the-counter and prescription medicines only as told by your health care  provider.  Keep all follow-up visits as told by your health care provider. This is important. You may need follow-up X-rays or ultrasounds to make sure that your stone has passed. How is this prevented? To prevent another kidney stone:  Drink enough fluid to keep your urine clear or pale yellow. This is the best way to prevent kidney stones.  Eat a healthy diet and follow recommendations from your health care provider about foods to avoid. You may be instructed to eat a low-protein diet. Recommendations vary depending on the type of kidney stone that you have.  Maintain a healthy weight.  Contact a health care provider if:  You have pain that gets worse or does not get better with medicine. Get help right away if:  You have a fever or chills.  You develop severe pain.  You develop new abdominal pain.  You faint.  You are unable to urinate. This information is not intended to replace advice given to you by your health care provider. Make sure you discuss any questions you have with your health care provider. Document Released: 12/09/2005 Document Revised: 06/28/2016 Document Reviewed: 05/24/2016 Elsevier Interactive Patient Education  2018 Elsevier Inc.  'Irritable Bowel Syndrome, Adult Irritable bowel syndrome (IBS) is not one specific disease. It is a group of symptoms that affects the organs responsible for digestion (gastrointestinal or GI tract). To regulate how your GI tract works, your body sends signals back and forth between your intestines and your brain. If you have IBS, there may be a problem with these signals. As a result, your GI tract does not function normally. Your intestines may become more sensitive and overreact to certain things. This is especially true when you eat certain foods or when you are under stress. There are four types of IBS. These may be determined based on the consistency of your stool:  IBS with diarrhea.  IBS with constipation.  Mixed  IBS.  Unsubtyped IBS.  It is important to know which type of IBS you have. Some treatments are more likely to be helpful for certain types of IBS. What are the causes? The exact cause of IBS is not known. What increases the risk? You may have a higher risk of IBS if:  You are a woman.  You are younger than 29 years old.  You have a family history of IBS.  You have mental health problems.  You have had bacterial infection of your GI tract.  What are the signs or symptoms? Symptoms of IBS vary from person to person. The main symptom is abdominal pain or discomfort. Additional symptoms usually include one or more of the following:  Diarrhea, constipation, or both.  Abdominal swelling or bloating.  Feeling full or sick after eating a small or regular-size meal.  Frequent gas.  Mucus in the stool.  A feeling of having more stool left after a bowel movement.  Symptoms tend to come and go. They may be associated with stress, psychiatric conditions, or nothing at all. How is this diagnosed? There is no specific test to diagnose  IBS. Your health care provider will make a diagnosis based on a physical exam, medical history, and your symptoms. You may have other tests to rule out other conditions that may be causing your symptoms. These may include:  Blood tests.  X-rays.  CT scan.  Endoscopy and colonoscopy. This is a test in which your GI tract is viewed with a long, thin, flexible tube.  How is this treated? There is no cure for IBS, but treatment can help relieve symptoms. IBS treatment often includes:  Changes to your diet, such as: ? Eating more fiber. ? Avoiding foods that cause symptoms. ? Drinking more water. ? Eating regular, medium-sized portioned meals.  Medicines. These may include: ? Fiber supplements if you have constipation. ? Medicine to control diarrhea (antidiarrheal medicines). ? Medicine to help control muscle spasms in your GI tract (antispasmodic  medicines). ? Medicines to help with any mental health issues, such as antidepressants or tranquilizers.  Therapy. ? Talk therapy may help with anxiety, depression, or other mental health issues that can make IBS symptoms worse.  Stress reduction. ? Managing your stress can help keep symptoms under control.  Follow these instructions at home:  Take medicines only as directed by your health care provider.  Eat a healthy diet. ? Avoid foods and drinks with added sugar. ? Include more whole grains, fruits, and vegetables gradually into your diet. This may be especially helpful if you have IBS with constipation. ? Avoid any foods and drinks that make your symptoms worse. These may include dairy products and caffeinated or carbonated drinks. ? Do not eat large meals. ? Drink enough fluid to keep your urine clear or pale yellow.  Exercise regularly. Ask your health care provider for recommendations of good activities for you.  Keep all follow-up visits as directed by your health care provider. This is important. Contact a health care provider if:  You have constant pain.  You have trouble or pain with swallowing.  You have worsening diarrhea. Get help right away if:  You have severe and worsening abdominal pain.  You have diarrhea and: ? You have a rash, stiff neck, or severe headache. ? You are irritable, sleepy, or difficult to awaken. ? You are weak, dizzy, or extremely thirsty.  You have bright red blood in your stool or you have black tarry stools.  You have unusual abdominal swelling that is painful.  You vomit continuously.  You vomit blood (hematemesis).  You have both abdominal pain and a fever. This information is not intended to replace advice given to you by your health care provider. Make sure you discuss any questions you have with your health care provider. Document Released: 12/09/2005 Document Revised: 05/10/2016 Document Reviewed: 08/26/2014 Elsevier  Interactive Patient Education  2018 [employer]  Diet for Irritable Bowel Syndrome When you have irritable bowel syndrome (IBS), the foods you eat and your eating habits are very important. IBS may cause various symptoms, such as abdominal pain, constipation, or diarrhea. Choosing the right foods can help ease discomfort caused by these symptoms. Work with your health care provider and dietitian to find the best eating plan to help control your symptoms. What general guidelines do I need to follow?  Keep a food diary. This will help you identify foods that cause symptoms. Write down: ? What you eat and when. ? What symptoms you have. ? When symptoms occur in relation to your meals.  Avoid foods that cause symptoms. Talk with your dietitian about other ways  to get the same nutrients that are in these foods.  Eat more foods that contain fiber. Take a fiber supplement if directed by your dietitian.  Eat your meals slowly, in a relaxed setting.  Aim to eat 5-6 small meals per day. Do not skip meals.  Drink enough fluids to keep your urine clear or pale yellow.  Ask your health care provider if you should take an over-the-counter probiotic during flare-ups to help restore healthy gut bacteria.  If you have cramping or diarrhea, try making your meals low in fat and high in carbohydrates. Examples of carbohydrates are pasta, rice, whole grain breads and cereals, fruits, and vegetables.  If dairy products cause your symptoms to flare up, try eating less of them. You might be able to handle yogurt better than other dairy products because it contains bacteria that help with digestion. What foods are not recommended? The following are some foods and drinks that may worsen your symptoms:  Fatty foods, such as [place] fries.  Milk products, such as cheese or ice cream.  Chocolate.  Alcohol.  Products with caffeine, such as coffee.  Carbonated drinks, such as soda.  The items listed  above may not be a complete list of foods and beverages to avoid. Contact your dietitian for more information. What foods are good sources of fiber? Your health care provider or dietitian may recommend that you eat more foods that contain fiber. Fiber can help reduce constipation and other IBS symptoms. Add foods with fiber to your diet a little at a time so that your body can get used to them. Too much fiber at once might cause gas and swelling of your abdomen. The following are some foods that are good sources of fiber:  Apples.  Peaches.  Pears.  Berries.  Figs.  Broccoli (raw).  Cabbage.  Carrots.  Raw peas.  Kidney beans.  Lima beans.  Whole grain bread.  Whole grain cereal.  Where to find more information: [employer] for Functional Gastrointestinal Disorders: www.iffgd.[employer] of Diabetes and Digestive and Kidney Diseases: http://norris-lawson.com/.aspx This information is not intended to replace advice given to you by your health care provider. Make sure you discuss any questions you have with your health care provider. Document Released: 02/29/2004 Document Revised: 05/16/2016 Document Reviewed: 03/11/2014 Elsevier Interactive Patient Education  2018 [employer].

## 2018-01-03 LAB — URINE CULTURE
MICRO NUMBER: 90046485
RESULT: NO GROWTH
SPECIMEN QUALITY:: ADEQUATE

## 2018-01-05 ENCOUNTER — Encounter: Payer: Self-pay | Admitting: Internal Medicine

## 2018-01-05 LAB — RETICULIN ANTIBODIES, IGA W TITER: Reticulin IGA Screen: NEGATIVE

## 2018-01-05 LAB — IRON,TIBC AND FERRITIN PANEL
%SAT: 22 % (ref 11–50)
FERRITIN: 37 ng/mL (ref 10–154)
IRON: 106 ug/dL (ref 40–190)
TIBC: 478 mcg/dL (calc) — ABNORMAL HIGH (ref 250–450)

## 2018-01-05 LAB — GLIADIN ANTIBODIES, SERUM
Gliadin IgA: 6 Units
Gliadin IgG: 4 Units

## 2018-01-05 LAB — TISSUE TRANSGLUTAMINASE, IGA: (tTG) Ab, IgA: 1 U/mL

## 2018-01-05 NOTE — Progress Notes (Signed)
Chief Complaint  Patient presents with  . Establish Care   New pt with husband with multiple problems  1. H/o kidney stones noted on imaging 05/2017 did not f/u with urology. She feels like her bladder is having spasm and urine hesisetancy w/o retention.   2. C/o stomach pain x 7-9 months radiating to right lower back worse when she bends over. Also c/o diarrhea, constipation stool is not solid. Sx's worse with onions/spice food.  She reports her dad has IBS.  Does not see blood in stools. She will have to stop activities for the day when abdominal pain huts.  3.c/o lymph node in right neck not resolved since 08/2017  4. Anxiety husband reports anxiety is bad and affecting day to day activities and life at home. Anxiety is about health 5. C/o lesion at anus husband denies anal intercourse  6. C/o right great toenail abnormal and also bleeding, irritated red lesion to right scalp at times.   7. Co itching rash to right hip    Review of Systems  Constitutional: Negative for fever.  HENT:       +dysphagia and LN neck right since 08/2017   Respiratory: Negative for shortness of breath.   Cardiovascular: Positive for palpitations.  Gastrointestinal: Positive for abdominal pain, constipation, diarrhea, nausea and vomiting.  Genitourinary: Positive for frequency.  Musculoskeletal: Positive for joint pain.  Skin:       +toenail fungus  +scalp lesion   Neurological: Negative for headaches.  Psychiatric/Behavioral: Positive for depression. The patient is nervous/anxious.        +stress    Past Medical History:  Diagnosis Date  . Anxiety   . Herpes   . Hypertension    pregnancy induced  . Kidney stones   . Obesity (BMI 30-39.9)   . Postpartum depression associated with first pregnancy   . Second pregnancy    Past Surgical History:  Procedure Laterality Date  . CESAREAN SECTION  12/21/2014   Teresa Coombs,   . CESAREAN SECTION N/A 05/27/2017   Procedure: CESAREAN SECTION;  Surgeon:  Nadara Mustard, MD;  Location: [facility] ORS;  Service: Obstetrics;  Laterality: N/A;   Family History  Problem Relation Age of Onset  . Hypertension Mother   . Breast cancer Maternal Grandmother    Social History   Socioeconomic History  . Marital status: Married    Spouse name: Not on file  . Number of children: Not on file  . Years of education: Not on file  . Highest education level: Not on file  Social Needs  . Financial resource strain: Not on file  . Food insecurity - worry: Not on file  . Food insecurity - inability: Not on file  . Transportation needs - medical: Not on file  . Transportation needs - non-medical: Not on file  Occupational History  . Not on file  Tobacco Use  . Smoking status: Never Smoker  . Smokeless tobacco: Never Used  Substance and Sexual Activity  . Alcohol use: No  . Drug use: No  . Sexual activity: Yes    Birth control/protection: None  Other Topics Concern  . Not on file  Social History Narrative  . Not on file   No outpatient medications have been marked as taking for the 01/02/18 encounter (Office Visit) with McLean-Scocuzza, Pasty Spillers, MD.   Allergies  Allergen Reactions  . Codeine Nausea Only  . Latex Rash   Recent Results (from the past 2160 hour(s))  Comprehensive metabolic panel  Status: Abnormal   Collection Time: 01/02/18  9:35 AM  Result Value Ref Range   Sodium 134 (L) 135 - 145 mEq/L   Potassium 4.3 3.5 - 5.1 mEq/L   Chloride 99 96 - 112 mEq/L   CO2 28 19 - 32 mEq/L   Glucose, Bld 89 70 - 99 mg/dL   BUN 15 6 - 23 mg/dL   Creatinine, Ser 1.61 0.40 - 1.20 mg/dL   Total Bilirubin 0.6 0.2 - 1.2 mg/dL   Alkaline Phosphatase 83 39 - 117 U/L   AST 16 0 - 37 U/L   ALT 13 0 - 35 U/L   Total Protein 8.5 (H) 6.0 - 8.3 g/dL   Albumin 4.9 3.5 - 5.2 g/dL   Calcium 9.7 8.4 - 09.6 mg/dL   GFR 045.40 >98.11 mL/min  CBC w/Diff     Status: Abnormal   Collection Time: 01/02/18  9:35 AM  Result Value Ref Range   WBC 6.7 4.0 - 10.5  K/uL   RBC 5.03 3.87 - 5.11 Mil/uL   Hemoglobin 15.9 (H) 12.0 - 15.0 g/dL   HCT 91.4 (H) 78.2 - 95.6 %   MCV 94.8 78.0 - 100.0 fl   MCHC 33.3 30.0 - 36.0 g/dL   RDW 21.3 08.6 - 57.8 %   Platelets 394.0 150.0 - 400.0 K/uL   Neutrophils Relative % 54.9 43.0 - 77.0 %   Lymphocytes Relative 37.6 12.0 - 46.0 %   Monocytes Relative 6.5 3.0 - 12.0 %   Eosinophils Relative 0.5 0.0 - 5.0 %   Basophils Relative 0.5 0.0 - 3.0 %   Neutro Abs 3.7 1.4 - 7.7 K/uL   Lymphs Abs 2.5 0.7 - 4.0 K/uL   Monocytes Absolute 0.4 0.1 - 1.0 K/uL   Eosinophils Absolute 0.0 0.0 - 0.7 K/uL   Basophils Absolute 0.0 0.0 - 0.1 K/uL  Lipid panel     Status: Abnormal   Collection Time: 01/02/18  9:35 AM  Result Value Ref Range   Cholesterol 266 (H) 0 - 200 mg/dL    Comment: ATP III Classification       Desirable:  < 200 mg/dL               Borderline High:  200 - 239 mg/dL          High:  > = 469 mg/dL   Triglycerides 629.5 (H) 0.0 - 149.0 mg/dL    Comment: Normal:  <284 mg/dLBorderline High:  150 - 199 mg/dL   HDL 13.24 >40.10 mg/dL   VLDL 27.2 0.0 - 53.6 mg/dL   LDL Cholesterol 644 (H) 0 - 99 mg/dL   Total CHOL/HDL Ratio 4     Comment:                Men          Women1/2 Average Risk     3.4          3.3Average Risk          5.0          4.42X Average Risk          9.6          7.13X Average Risk          15.0          11.0                       NonHDL 204.41     Comment:  NOTE:  Non-HDL goal should be 30 mg/dL higher than patient's LDL goal (i.e. LDL goal of < 70 mg/dL, would have non-HDL goal of < 100 mg/dL)  Urinalysis, Routine w reflex microscopic     Status: Abnormal   Collection Time: 01/02/18  9:35 AM  Result Value Ref Range   Color, Urine YELLOW Yellow;Lt. Yellow   APPearance CLEAR Clear   Specific Gravity, Urine 1.025 1.000 - 1.030   pH 6.0 5.0 - 8.0   Total Protein, Urine NEGATIVE Negative   Urine Glucose NEGATIVE Negative   Ketones, ur NEGATIVE Negative   Bilirubin Urine NEGATIVE Negative   Hgb  urine dipstick TRACE-INTACT (A) Negative   Urobilinogen, UA 0.2 0.0 - 1.0   Leukocytes, UA NEGATIVE Negative   Nitrite NEGATIVE Negative   WBC, UA 3-6/hpf (A) 0-2/hpf   RBC / HPF 0-2/hpf 0-2/hpf   Mucus, UA Presence of (A) None   Squamous Epithelial / LPF Rare(0-4/hpf) Rare(0-4/hpf)  TSH     Status: None   Collection Time: 01/02/18  9:35 AM  Result Value Ref Range   TSH 1.70 0.35 - 4.50 uIU/mL  T4, free     Status: None   Collection Time: 01/02/18  9:35 AM  Result Value Ref Range   Free T4 0.93 0.60 - 1.60 ng/dL    Comment: Specimens from patients who are undergoing biotin therapy and /or ingesting biotin supplements may contain high levels of biotin.  The higher biotin concentration in these specimens interferes with this Free T4 assay.  Specimens that contain high levels  of biotin may cause false high results for this Free T4 assay.  Please interpret results in light of the total clinical presentation of the patient.    Urine Culture     Status: None   Collection Time: 01/02/18  9:35 AM  Result Value Ref Range   MICRO NUMBER: [id number]    SPECIMEN QUALITY: ADEQUATE    Sample Source NOT GIVEN    STATUS: FINAL    Result: No Growth   Iron, TIBC and Ferritin Panel     Status: Abnormal   Collection Time: 01/02/18  9:35 AM  Result Value Ref Range   Iron 106 40 - 190 mcg/dL   TIBC 540478 (H) 981250 - 191450 mcg/dL (calc)   %SAT 22 11 - 50 % (calc)   Ferritin 37 10 - 154 ng/mL   Objective  Body mass index is 28.7 kg/m. Wt Readings from Last 3 Encounters:  01/02/18 153 lb 2 oz (69.5 kg)  08/08/17 164 lb (74.4 kg)  07/09/17 166 lb (75.3 kg)   Temp Readings from Last 3 Encounters:  01/02/18 98.5 F (36.9 C) (Oral)  05/30/17 97.9 F (36.6 C) (Oral)  04/15/17 98.5 F (36.9 C) (Oral)   BP Readings from Last 3 Encounters:  01/02/18 (!) 134/96  08/08/17 120/80  07/09/17 102/70   Pulse Readings from Last 3 Encounters:  01/02/18 85  08/08/17 62  07/09/17 (!) 50  O2 sat room air 98%    Physical Exam  Constitutional: She is oriented to person, place, and time and well-developed, well-nourished, and in no distress.  HENT:  Head: Normocephalic and atraumatic.  Mouth/Throat: Oropharynx is clear and moist and mucous membranes are normal.  Eyes: Conjunctivae are normal. Pupils are equal, round, and reactive to light.  Neck:  Right lateral neck lymphadenopathy SCM area   Cardiovascular: Normal rate and regular rhythm.  Murmur heard. Pulmonary/Chest: Effort normal and breath sounds normal.  Abdominal: Soft. Bowel  sounds are normal. There is tenderness.  Genitourinary: Rectal exam shows external hemorrhoid.  Lymphadenopathy:    She has cervical adenopathy.  Neurological: She is alert and oriented to person, place, and time. Gait normal. Gait normal.  Skin: Skin is warm and dry.     0.1 cm red scalp lesion ? Vascular vs other  +right great toe onychomycosis   Psychiatric: Memory, affect and judgment normal. Her mood appears anxious.  Nursing note and vitals reviewed.   Assessment   1. Abdominal pain with constipation/diarrhea, ab spasm? Etiology IBS r/o celiac vs other with h/o kidney stones 2. Kidney stones noted CT renal 05/2017  3. Right neck lymph node since 08/2017  4. Anxiety with h/o postpartum depression  5. Right scalp lesion and onychomycosis  6. HM 7. Right hip eczematous changes and striae    Plan  1.  Labs today CMET, CBC, UA/Culture, TSH, T4, lipid, celiac panel, anemia panel (h/p anemia) CT ab/pelvis with contrast  Referral GI Dr. Servando Snare, given info about FODMAP diet  2. See above referred to urology  3. CT neck  To eval lymphadenopathy  4. Cont meds may need another agent as wellbutrin only tx'ed depression not anxiety  Consider future  5. Referral dermatology tbse and eval lesions  6.  Declines flu shot.  Tdap had 04/25/17  Pap with OB/GYN need to get records in future.  Consider check hep b titer in future   7. [facility] 1 week right hip    Provider: Dr. French Ana McLean-Scocuzza-Internal Medicine

## 2018-01-16 ENCOUNTER — Ambulatory Visit
Admission: RE | Admit: 2018-01-16 | Discharge: 2018-01-16 | Disposition: A | Discharge status: Home / Self Care | Payer: BLUE CROSS/BLUE SHIELD | Source: Ambulatory Visit | Attending: Internal Medicine | Admitting: Internal Medicine

## 2018-01-16 ENCOUNTER — Telehealth: Payer: Self-pay | Admitting: Internal Medicine

## 2018-01-16 ENCOUNTER — Other Ambulatory Visit: Payer: Self-pay

## 2018-01-16 ENCOUNTER — Other Ambulatory Visit: Payer: Self-pay | Admitting: Family Medicine

## 2018-01-16 ENCOUNTER — Encounter: Payer: Self-pay | Admitting: Gastroenterology

## 2018-01-16 ENCOUNTER — Ambulatory Visit: Payer: BLUE CROSS/BLUE SHIELD | Admitting: Gastroenterology

## 2018-01-16 ENCOUNTER — Ambulatory Visit: Payer: Self-pay | Admitting: Urology

## 2018-01-16 VITALS — BP 138/89 | HR 80 | Wt 157.4 lb

## 2018-01-16 DIAGNOSIS — K582 Mixed irritable bowel syndrome: Secondary | ICD-10-CM | POA: Diagnosis not present

## 2018-01-16 DIAGNOSIS — R221 Localized swelling, mass and lump, neck: Secondary | ICD-10-CM | POA: Diagnosis present

## 2018-01-16 DIAGNOSIS — N132 Hydronephrosis with renal and ureteral calculous obstruction: Secondary | ICD-10-CM | POA: Diagnosis not present

## 2018-01-16 DIAGNOSIS — R1084 Generalized abdominal pain: Secondary | ICD-10-CM

## 2018-01-16 DIAGNOSIS — R109 Unspecified abdominal pain: Secondary | ICD-10-CM | POA: Diagnosis present

## 2018-01-16 MED ORDER — IOPAMIDOL (ISOVUE-300) INJECTION 61%
100.0000 mL | Freq: Once | INTRAVENOUS | Status: AC | PRN
  Administered 2018-01-16: 100 mL via INTRAVENOUS

## 2018-01-16 MED ORDER — TAMSULOSIN HCL 0.4 MG PO CAPS: 0.4000 mg | ORAL_CAPSULE | Freq: Every day | ORAL | 0 refills | Status: DC | PRN

## 2018-01-16 NOTE — Progress Notes (Signed)
Called pt.  CT d/w pt.  Renal stone noted.   No pain now.  H/o stones in the past.    She has zero sx at this point, no dysuria, no pain.  It may be that the stone has passed into her bladder.   D/w pt about options.  If any pain/sx, then to ER.  If feeling well, then okay to try flomax with plenty of fluids at home over the weekend.  Pt to update PCP on Monday.   She may need f/u imaging to demonstrate resolution of hydro seen on CT.   She agrees.   Routed to PCP and GI.

## 2018-01-16 NOTE — Patient Instructions (Addendum)
F/U  3 months 
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
## 2018-01-16 NOTE — Telephone Encounter (Signed)
Stevie Kernracey Oakley from [facility] called the CT abd/pelvis results for patient, called the on-call physician Dr. Para Marchuncan to give the results, he was driving and asked to leave the patient information on his VM and he would get the results when he gets home to the computer, VM left.

## 2018-01-17 NOTE — Progress Notes (Signed)
Thank you I have already referred her to urology prior to the CT appt should be pending  Will call her Monday   TMS

## 2018-01-19 ENCOUNTER — Telehealth: Payer: Self-pay | Admitting: Internal Medicine

## 2018-01-19 NOTE — Telephone Encounter (Signed)
Reviewed the results and physician's note with patient. Discussed water intake and healthy diet and exercise to help lower cholesterol.

## 2018-01-20 ENCOUNTER — Telehealth: Payer: Self-pay

## 2018-01-20 NOTE — Telephone Encounter (Signed)
Left message to return call 

## 2018-01-20 NOTE — Telephone Encounter (Signed)
Copied from CRM [phone]. Topic: Quick Communication - See Telephone Encounter >> Jan 20, 2018  8:29 AM Elliot GaultBell, Tiffany M wrote: CRM for notification. See Telephone encounter for:   01/20/18.  Relation to pt: self  Call back number: [phone]   Reason for call:  Patient inquiring about imaging results, please advise >> Jan 20, 2018  8:30 AM Elliot GaultBell, Tiffany M wrote: CRM for notification. See Telephone encounter for:   01/20/18.  Relation to pt: self  Call back number: [phone]   Reason for call:  Patient inquiring about imaging results, please advise

## 2018-01-21 ENCOUNTER — Other Ambulatory Visit: Payer: Self-pay | Admitting: Internal Medicine

## 2018-01-21 DIAGNOSIS — N200 Calculus of kidney: Secondary | ICD-10-CM

## 2018-01-21 MED ORDER — TAMSULOSIN HCL 0.4 MG PO CAPS: 0.4000 mg | ORAL_CAPSULE | Freq: Every day | ORAL | 0 refills | Status: DC

## 2018-01-21 NOTE — Telephone Encounter (Signed)
Refilled  F/u urology 2/15    TMS

## 2018-01-21 NOTE — Progress Notes (Signed)
Rx Flomax still has kidney stones and hydroureteronephrosis  She needs to see urology   Thanks TMS

## 2018-01-21 NOTE — Telephone Encounter (Addendum)
Patient advised of CT of neck results and verbalized understanding.  She states she is beginning to have back from kidney stone on experience back pain on right   side x 2 days . Still good urine flow. If she move certain way feels in right side pain in lower back .    She will need refill on Flomax .

## 2018-01-22 NOTE — Telephone Encounter (Signed)
Left voicemail advising of below . 

## 2018-01-23 ENCOUNTER — Ambulatory Visit: Payer: BLUE CROSS/BLUE SHIELD | Admitting: Internal Medicine

## 2018-01-23 ENCOUNTER — Encounter: Payer: Self-pay | Admitting: Internal Medicine

## 2018-01-23 VITALS — BP 136/86 | HR 62 | Temp 98.2°F | Ht 61.25 in | Wt 156.6 lb

## 2018-01-23 DIAGNOSIS — F53 Postpartum depression: Secondary | ICD-10-CM

## 2018-01-23 DIAGNOSIS — E785 Hyperlipidemia, unspecified: Secondary | ICD-10-CM

## 2018-01-23 DIAGNOSIS — F419 Anxiety disorder, unspecified: Secondary | ICD-10-CM | POA: Diagnosis not present

## 2018-01-23 DIAGNOSIS — K589 Irritable bowel syndrome without diarrhea: Secondary | ICD-10-CM | POA: Diagnosis not present

## 2018-01-23 DIAGNOSIS — N200 Calculus of kidney: Secondary | ICD-10-CM

## 2018-01-23 DIAGNOSIS — O99345 Other mental disorders complicating the puerperium: Secondary | ICD-10-CM

## 2018-01-23 MED ORDER — SERTRALINE HCL 50 MG PO TABS: 50.0000 mg | ORAL_TABLET | Freq: Every day | ORAL | 2 refills | Status: DC

## 2018-01-23 NOTE — Patient Instructions (Addendum)
Start Zoloft in the am 1 pill for anxiety  Take care  F/u in 4-6 weeks    Panic Attacks Panic attacks are sudden, short feelings of great fear or discomfort. You may have them for no reason when you are relaxed, when you are uneasy (anxious), or when you are sleeping. Follow these instructions at home:  Take all your medicines as told.  Check with your doctor before starting new medicines.  Keep all doctor visits. Contact a doctor if:  You are not able to take your medicines as told.  Your symptoms do not get better.  Your symptoms get worse. Get help right away if:  Your attacks seem different than your normal attacks.  You have thoughts about hurting yourself or others.  You take panic attack medicine and you have a side effect. This information is not intended to replace advice given to you by your health care provider. Make sure you discuss any questions you have with your health care provider. Document Released: 01/11/2011 Document Revised: 05/16/2016 Document Reviewed: 07/23/2013 Elsevier Interactive Patient Education  2017 Elsevier Inc.  Sertraline tablets What is this medicine? SERTRALINE (SER tra leen) is used to treat depression. It may also be used to treat obsessive compulsive disorder, panic disorder, post-trauma stress, premenstrual dysphoric disorder (PMDD) or social anxiety. This medicine may be used for other purposes; ask your health care provider or pharmacist if you have questions. COMMON BRAND NAME(S): Zoloft What should I tell my health care provider before I take this medicine? They need to know if you have any of these conditions: -bleeding disorders -bipolar disorder or a family history of bipolar disorder -glaucoma -heart disease -high blood pressure -history of irregular heartbeat -history of low levels of calcium, magnesium, or potassium in the blood -if you often drink alcohol -liver disease -receiving electroconvulsive  therapy -seizures -suicidal thoughts, plans, or attempt; a previous suicide attempt by you or a family member -take medicines that treat or prevent blood clots -thyroid disease -an unusual or allergic reaction to sertraline, other medicines, foods, dyes, or preservatives -pregnant or trying to get pregnant -breast-feeding How should I use this medicine? Take this medicine by mouth with a glass of water. Follow the directions on the prescription label. You can take it with or without food. Take your medicine at regular intervals. Do not take your medicine more often than directed. Do not stop taking this medicine suddenly except upon the advice of your doctor. Stopping this medicine too quickly may cause serious side effects or your condition may worsen. A special MedGuide will be given to you by the pharmacist with each prescription and refill. Be sure to read this information carefully each time. Talk to your pediatrician regarding the use of this medicine in children. While this drug may be prescribed for children as young as 7 years for selected conditions, precautions do apply. Overdosage: If you think you have taken too much of this medicine contact a poison control center or emergency room at once. NOTE: This medicine is only for you. Do not share this medicine with others. What if I miss a dose? If you miss a dose, take it as soon as you can. If it is almost time for your next dose, take only that dose. Do not take double or extra doses. What may interact with this medicine? Do not take this medicine with any of the following medications: -cisapride -dofetilide -dronedarone -linezolid -MAOIs like Carbex, Eldepryl, Marplan, Nardil, and Parnate -methylene blue (injected into  a vein) -pimozide -thioridazine This medicine may also interact with the following medications: -alcohol -amphetamines -aspirin and aspirin-like medicines -certain medicines for depression, anxiety, or  psychotic disturbances -certain medicines for fungal infections like ketoconazole, fluconazole, posaconazole, and itraconazole -certain medicines for irregular heart beat like flecainide, quinidine, propafenone -certain medicines for migraine headaches like almotriptan, eletriptan, frovatriptan, naratriptan, rizatriptan, sumatriptan, zolmitriptan -certain medicines for sleep -certain medicines for seizures like carbamazepine, valproic acid, phenytoin -certain medicines that treat or prevent blood clots like warfarin, enoxaparin, dalteparin -cimetidine -digoxin -diuretics -fentanyl -isoniazid -lithium -NSAIDs, medicines for pain and inflammation, like ibuprofen or naproxen -other medicines that prolong the QT interval (cause an abnormal heart rhythm) -rasagiline -safinamide -supplements like St. John's wort, kava kava, valerian -tolbutamide -tramadol -tryptophan This list may not describe all possible interactions. Give your health care provider a list of all the medicines, herbs, non-prescription drugs, or dietary supplements you use. Also tell them if you smoke, drink alcohol, or use illegal drugs. Some items may interact with your medicine. What should I watch for while using this medicine? Tell your doctor if your symptoms do not get better or if they get worse. Visit your doctor or health care professional for regular checks on your progress. Because it may take several weeks to see the full effects of this medicine, it is important to continue your treatment as prescribed by your doctor. Patients and their families should watch out for new or worsening thoughts of suicide or depression. Also watch out for sudden changes in feelings such as feeling anxious, agitated, panicky, irritable, hostile, aggressive, impulsive, severely restless, overly excited and hyperactive, or not being able to sleep. If this happens, especially at the beginning of treatment or after a change in dose, call your  health care professional. Bonita QuinYou may get drowsy or dizzy. Do not drive, use machinery, or do anything that needs mental alertness until you know how this medicine affects you. Do not stand or sit up quickly, especially if you are an older patient. This reduces the risk of dizzy or fainting spells. Alcohol may interfere with the effect of this medicine. Avoid alcoholic drinks. Your mouth may get dry. Chewing sugarless gum or sucking hard candy, and drinking plenty of water may help. Contact your doctor if the problem does not go away or is severe. What side effects may I notice from receiving this medicine? Side effects that you should report to your doctor or health care professional as soon as possible: -allergic reactions like skin rash, itching or hives, swelling of the face, lips, or tongue -anxious -black, tarry stools -changes in vision -confusion -elevated mood, decreased need for sleep, racing thoughts, impulsive behavior -eye pain -fast, irregular heartbeat -feeling faint or lightheaded, falls -feeling agitated, angry, or irritable -hallucination, loss of contact with reality -loss of balance or coordination -loss of memory -painful or prolonged erections -restlessness, pacing, inability to keep still -seizures -stiff muscles -suicidal thoughts or other mood changes -trouble sleeping -unusual bleeding or bruising -unusually weak or tired -vomiting Side effects that usually do not require medical attention (report to your doctor or health care professional if they continue or are bothersome): -change in appetite or weight -change in sex drive or performance -diarrhea -increased sweating -indigestion, nausea -tremors This list may not describe all possible side effects. Call your doctor for medical advice about side effects. You may report side effects to FDA at 1-800-FDA-1088. Where should I keep my medicine? Keep out of the reach of children. Store at room temperature  between 15 and 30 degrees C (59 and 86 degrees F). Throw away any unused medicine after the expiration date. NOTE: This sheet is a summary. It may not cover all possible information. If you have questions about this medicine, talk to your doctor, pharmacist, or health care provider.  2018 Elsevier/Gold Standard (2016-12-13 14:17:49)  Cholesterol Cholesterol is a white, waxy, fat-like substance that is needed by the human body in small amounts. The liver makes all the cholesterol we need. Cholesterol is carried from the liver by the blood through the blood vessels. Deposits of cholesterol (plaques) may build up on blood vessel (artery) walls. Plaques make the arteries narrower and stiffer. Cholesterol plaques increase the risk for heart attack and stroke. You cannot feel your cholesterol level even if it is very high. The only way to know that it is high is to have a blood test. Once you know your cholesterol levels, you should keep a record of the test results. Work with your health care provider to keep your levels in the desired range. What do the results mean?  Total cholesterol is a rough measure of all the cholesterol in your blood.  LDL (low-density lipoprotein) is the "bad" cholesterol. This is the type that causes plaque to build up on the artery walls. You want this level to be low.  HDL (high-density lipoprotein) is the "good" cholesterol because it cleans the arteries and carries the LDL away. You want this level to be high.  Triglycerides are fat that the body can either burn for energy or store. High levels are closely linked to heart disease. What are the desired levels of cholesterol?  Total cholesterol below 200.  LDL below 100 for people who are at risk, below 70 for people at very high risk.  HDL above 40 is good. A level of 60 or higher is considered to be protective against heart disease.  Triglycerides below 150. How can I lower my cholesterol? Diet Follow your diet  program as told by your health care provider.  Choose fish or white meat chicken and [place], roasted or baked. Limit fatty cuts of red meat, fried foods, and processed meats, such as sausage and lunch meats.  Eat lots of fresh fruits and vegetables.  Choose whole grains, beans, pasta, potatoes, and cereals.  Choose olive oil, corn oil, or canola oil, and use only small amounts.  Avoid butter, mayonnaise, shortening, or palm kernel oils.  Avoid foods with trans fats.  Drink skim or nonfat milk and eat low-fat or nonfat yogurt and cheeses. Avoid whole milk, cream, ice cream, egg yolks, and full-fat cheeses.  Healthier desserts include angel food cake, ginger snaps, animal crackers, hard candy, popsicles, and low-fat or nonfat frozen yogurt. Avoid pastries, cakes, pies, and cookies.  Exercise  Follow your exercise program as told by your health care provider. A regular program: ? Helps to decrease LDL and raise HDL. ? Helps with weight control.  Do things that increase your activity level, such as gardening, walking, and taking the stairs.  Ask your health care provider about ways that you can be more active in your daily life.  Medicine  Take over-the-counter and prescription medicines only as told by your health care provider. ? Medicine may be prescribed by your health care provider to help lower cholesterol and decrease the risk for heart disease. This is usually done if diet and exercise have failed to bring down cholesterol levels. ? If you have several risk factors, you may need  medicine even if your levels are normal.  This information is not intended to replace advice given to you by your health care provider. Make sure you discuss any questions you have with your health care provider. Document Released: 09/03/2001 Document Revised: 07/06/2016 Document Reviewed: 06/08/2016 Elsevier Interactive Patient Education  [employer].

## 2018-01-23 NOTE — Progress Notes (Signed)
Pre visit review using our clinic review tool, if applicable. No additional management support is needed unless otherwise documented below in the visit note. 

## 2018-01-23 NOTE — Progress Notes (Signed)
Patient's CT showed a Rt. Ureter obstructing stone. PCP is following up on this and has referred to urology.   Distal small bowel fecalization was also reported. Will follow up in clinic as scheduled to see if her abdominal pain improves after treatment of her obstructing stone by Urology, and after regular BMs with miralax.

## 2018-01-26 ENCOUNTER — Encounter: Payer: Self-pay | Admitting: Internal Medicine

## 2018-01-26 DIAGNOSIS — E785 Hyperlipidemia, unspecified: Secondary | ICD-10-CM | POA: Insufficient documentation

## 2018-01-26 DIAGNOSIS — K589 Irritable bowel syndrome without diarrhea: Secondary | ICD-10-CM | POA: Insufficient documentation

## 2018-01-26 NOTE — Progress Notes (Signed)
Chief Complaint  Patient presents with  . Follow-up   Follow up doing ok  1. Kidney stones with moderate to severe hydroureternephrosis noted on CT ab/pelvis she still has "bladder spasms" and right flank pain but sx's somewhat better since being on flomax  2. Just saw GI and dx'ed with IBS who want her to to IBS counseling and put her on Miralax qd  3. Panic/anxiety worse since last visit she felt like insides shaking esp due to husbands recent health scare where she found him on the floor unable to move right side and he had to go to the hospital  4. HLD + with FH +HLD pt states since kid mother told her she has had bad cholesterol. She admits she skips breakfast and has poor diet choices . 5. Rash to right hip improved.    Review of Systems  Constitutional: Negative for weight loss.  HENT: Negative for hearing loss.   Eyes:       No vision issues   Respiratory: Negative for shortness of breath.   Cardiovascular: Negative for chest pain.  Gastrointestinal: Positive for abdominal pain and constipation.  Genitourinary:       +bladder spasm  Musculoskeletal: Negative for falls.  Skin: Negative for rash.  Psychiatric/Behavioral: The patient is nervous/anxious.    Past Medical History:  Diagnosis Date  . Anxiety   . Asthma   . Heart murmur   . Herpes   . Hypertension    pregnancy induced  . IBS (irritable bowel syndrome)   . Kidney stones   . Obesity (BMI 30-39.9)   . Postpartum depression associated with first pregnancy   . Second pregnancy    Past Surgical History:  Procedure Laterality Date  . CESAREAN SECTION  12/21/2014   Teresa Coombsuter Banks, [place]  . CESAREAN SECTION N/A 05/27/2017   Procedure: CESAREAN SECTION;  Surgeon: Nadara MustardHarris, Robert P, MD;  Location: [facility] ORS;  Service: Obstetrics;  Laterality: N/A;  . OTHER SURGICAL HISTORY     mole bx   . WISDOM TOOTH EXTRACTION     Family History  Problem Relation Age of Onset  . Hypertension Mother   . Depression Mother   .  Hyperlipidemia Mother   . Diverticulosis Mother   . Hearing loss Father   . Hypertension Father   . Irritable bowel syndrome Father   . Hyperlipidemia Father   . Breast cancer Maternal Grandmother   . Miscarriages / Stillbirths Maternal Grandmother   . Cancer Maternal Grandmother        breast   . Diabetes Maternal Grandfather   . Heart disease Maternal Grandfather        MI  . Arthritis Paternal Grandfather   . Irritable bowel syndrome Paternal Aunt   . GER disease Paternal Aunt   . Crohn's disease Paternal Aunt   . Diabetes Other    Social History   Socioeconomic History  . Marital status: Married    Spouse name: Not on file  . Number of children: Not on file  . Years of education: Not on file  . Highest education level: Not on file  Social Needs  . Financial resource strain: Not on file  . Food insecurity - worry: Not on file  . Food insecurity - inability: Not on file  . Transportation needs - medical: Not on file  . Transportation needs - non-medical: Not on file  Occupational History  . Not on file  Tobacco Use  . Smoking status: Never Smoker  .  Smokeless tobacco: Never Used  Substance and Sexual Activity  . Alcohol use: Yes    Comment: occasion  . Drug use: No  . Sexual activity: Yes    Birth control/protection: None  Other Topics Concern  . Not on file  Social History Narrative   Married    2 Retail buyer    Current Meds  Medication Sig  . buPROPion (WELLBUTRIN XL) 150 MG 24 hr tablet Take 1 tablet (150 mg total) by mouth daily.  . norgestimate-ethinyl estradiol (ORTHO-CYCLEN,SPRINTEC,PREVIFEM) 0.25-35 MG-MCG tablet Take 1 tablet by mouth daily.  . tamsulosin (FLOMAX) 0.4 MG CAPS capsule Take 1 capsule (0.4 mg total) by mouth daily after supper. For kidney stones  . triamcinolone cream (KENALOG) 0.1 % Apply 1 application topically 2 (two) times daily. Right hip x 7-14 days as needed   Allergies  Allergen Reactions  . Codeine Nausea Only  and Nausea And Vomiting  . Latex Rash   Recent Results (from the past 2160 hour(s))  Comprehensive metabolic panel     Status: Abnormal   Collection Time: 01/02/18  9:35 AM  Result Value Ref Range   Sodium 134 (L) 135 - 145 mEq/L   Potassium 4.3 3.5 - 5.1 mEq/L   Chloride 99 96 - 112 mEq/L   CO2 28 19 - 32 mEq/L   Glucose, Bld 89 70 - 99 mg/dL   BUN 15 6 - 23 mg/dL   Creatinine, Ser 1.61 0.40 - 1.20 mg/dL   Total Bilirubin 0.6 0.2 - 1.2 mg/dL   Alkaline Phosphatase 83 39 - 117 U/L   AST 16 0 - 37 U/L   ALT 13 0 - 35 U/L   Total Protein 8.5 (H) 6.0 - 8.3 g/dL   Albumin 4.9 3.5 - 5.2 g/dL   Calcium 9.7 8.4 - 09.6 mg/dL   GFR 045.40 >98.11 mL/min  CBC w/Diff     Status: Abnormal   Collection Time: 01/02/18  9:35 AM  Result Value Ref Range   WBC 6.7 4.0 - 10.5 K/uL   RBC 5.03 3.87 - 5.11 Mil/uL   Hemoglobin 15.9 (H) 12.0 - 15.0 g/dL   HCT 91.4 (H) 78.2 - 95.6 %   MCV 94.8 78.0 - 100.0 fl   MCHC 33.3 30.0 - 36.0 g/dL   RDW 21.3 08.6 - 57.8 %   Platelets 394.0 150.0 - 400.0 K/uL   Neutrophils Relative % 54.9 43.0 - 77.0 %   Lymphocytes Relative 37.6 12.0 - 46.0 %   Monocytes Relative 6.5 3.0 - 12.0 %   Eosinophils Relative 0.5 0.0 - 5.0 %   Basophils Relative 0.5 0.0 - 3.0 %   Neutro Abs 3.7 1.4 - 7.7 K/uL   Lymphs Abs 2.5 0.7 - 4.0 K/uL   Monocytes Absolute 0.4 0.1 - 1.0 K/uL   Eosinophils Absolute 0.0 0.0 - 0.7 K/uL   Basophils Absolute 0.0 0.0 - 0.1 K/uL  Lipid panel     Status: Abnormal   Collection Time: 01/02/18  9:35 AM  Result Value Ref Range   Cholesterol 266 (H) 0 - 200 mg/dL    Comment: ATP III Classification       Desirable:  < 200 mg/dL               Borderline High:  200 - 239 mg/dL          High:  > = 469 mg/dL   Triglycerides 629.5 (H) 0.0 - 149.0 mg/dL    Comment:  Normal:  <150 mg/dLBorderline High:  150 - 199 mg/dL   HDL 16.10 >96.04 mg/dL   VLDL 54.0 0.0 - 98.1 mg/dL   LDL Cholesterol 191 (H) 0 - 99 mg/dL   Total CHOL/HDL Ratio 4     Comment:                 Men          Women1/2 Average Risk     3.4          3.3Average Risk          5.0          4.42X Average Risk          9.6          7.13X Average Risk          15.0          11.0                       NonHDL 204.41     Comment: NOTE:  Non-HDL goal should be 30 mg/dL higher than patient's LDL goal (i.e. LDL goal of < 70 mg/dL, would have non-HDL goal of < 100 mg/dL)  Urinalysis, Routine w reflex microscopic     Status: Abnormal   Collection Time: 01/02/18  9:35 AM  Result Value Ref Range   Color, Urine YELLOW Yellow;Lt. Yellow   APPearance CLEAR Clear   Specific Gravity, Urine 1.025 1.000 - 1.030   pH 6.0 5.0 - 8.0   Total Protein, Urine NEGATIVE Negative   Urine Glucose NEGATIVE Negative   Ketones, ur NEGATIVE Negative   Bilirubin Urine NEGATIVE Negative   Hgb urine dipstick TRACE-INTACT (A) Negative   Urobilinogen, UA 0.2 0.0 - 1.0   Leukocytes, UA NEGATIVE Negative   Nitrite NEGATIVE Negative   WBC, UA 3-6/hpf (A) 0-2/hpf   RBC / HPF 0-2/hpf 0-2/hpf   Mucus, UA Presence of (A) None   Squamous Epithelial / LPF Rare(0-4/hpf) Rare(0-4/hpf)  TSH     Status: None   Collection Time: 01/02/18  9:35 AM  Result Value Ref Range   TSH 1.70 0.35 - 4.50 uIU/mL  T4, free     Status: None   Collection Time: 01/02/18  9:35 AM  Result Value Ref Range   Free T4 0.93 0.60 - 1.60 ng/dL    Comment: Specimens from patients who are undergoing biotin therapy and /or ingesting biotin supplements may contain high levels of biotin.  The higher biotin concentration in these specimens interferes with this Free T4 assay.  Specimens that contain high levels  of biotin may cause false high results for this Free T4 assay.  Please interpret results in light of the total clinical presentation of the patient.    Urine Culture     Status: None   Collection Time: 01/02/18  9:35 AM  Result Value Ref Range   MICRO NUMBER: [id number]    SPECIMEN QUALITY: ADEQUATE    Sample Source NOT GIVEN    STATUS: FINAL     Result: No Growth   Gliadin antibodies, serum     Status: None   Collection Time: 01/02/18  9:35 AM  Result Value Ref Range   Gliadin IgA 6 Units    Comment: .           Value  Interpretation           -----  --------------           <  20    Antibody not detected           >or=20 Antibody detected .    Gliadin IgG 4 Units    Comment: .           Value  Interpretation           -----  --------------           <20    Antibody not detected           >or=20 Antibody detected .   Tissue transglutaminase, IgA     Status: None   Collection Time: 01/02/18  9:35 AM  Result Value Ref Range   (tTG) Ab, IgA 1 U/mL    Comment: .        Value      Interpretation        -----      --------------        <4         No Antibody Detected        > or = 4   Antibody Detected .   Reticulin Antibody, IgA w reflex titer     Status: None   Collection Time: 01/02/18  9:35 AM  Result Value Ref Range   Reticulin IGA Screen Negative Negative  Iron, TIBC and Ferritin Panel     Status: Abnormal   Collection Time: 01/02/18  9:35 AM  Result Value Ref Range   Iron 106 40 - 190 mcg/dL   TIBC 161 (H) 096 - 045 mcg/dL (calc)   %SAT 22 11 - 50 % (calc)   Ferritin 37 10 - 154 ng/mL   Objective  Body mass index is 29.35 kg/m. Wt Readings from Last 3 Encounters:  01/23/18 156 lb 9.6 oz (71 kg)  01/16/18 157 lb 6.4 oz (71.4 kg)  01/02/18 153 lb 2 oz (69.5 kg)   Temp Readings from Last 3 Encounters:  01/23/18 98.2 F (36.8 C) (Oral)  01/02/18 98.5 F (36.9 C) (Oral)  05/30/17 97.9 F (36.6 C) (Oral)   BP Readings from Last 3 Encounters:  01/23/18 136/86  01/16/18 138/89  01/02/18 (!) 134/96   Pulse Readings from Last 3 Encounters:  01/23/18 62  01/16/18 80  01/02/18 85   O2 sat room air 98%  Physical Exam  Constitutional: She is oriented to person, place, and time and well-developed, well-nourished, and in no distress.  HENT:  Head: Normocephalic and atraumatic.  Eyes: Conjunctivae are  normal. Pupils are equal, round, and reactive to light.  Cardiovascular: Normal rate, regular rhythm and normal heart sounds.  Pulmonary/Chest: Effort normal and breath sounds normal.  Abdominal: Soft. Bowel sounds are normal. There is no tenderness.  Mild CVA ttp right  Neurological: She is alert and oriented to person, place, and time. Gait normal.  Skin: Skin is warm and dry.  Psychiatric: Mood, memory, affect and judgment normal.  Nursing note and vitals reviewed.   Assessment   1. Kidney stones with moderate to severe right hydroureteronephrosis  2. IBS with constipation  3. Panic/anxiety and h/o postpartum depression  4. HLD  5. HM Plan  1. Pending urology consult  Cont Flomax 0.4 qhs  2.  miralax qd f/u with GI  IBS counseling  3. Add Zoloft 50 mg f/u in 4-6 weeks Cont Wellbutrin XL 150  4. Disc healthy diet choices and given handout  Trend HLD  5.  Declines flu shot  Tdap had 04/25/17  Pap need to get records  OB/GYN  Check hep B titer future  pendign appt derm 02/13/18 check right scalp lesion   Need to repeat CBC in future  Provider: Dr. French Ana McLean-Scocuzza-Internal Medicine

## 2018-01-27 ENCOUNTER — Telehealth: Payer: Self-pay | Admitting: Gastroenterology

## 2018-01-27 NOTE — Telephone Encounter (Signed)
LVM for patient to call and reschedule. °

## 2018-02-06 ENCOUNTER — Other Ambulatory Visit: Payer: Self-pay | Admitting: Radiology

## 2018-02-06 ENCOUNTER — Telehealth: Payer: Self-pay

## 2018-02-06 ENCOUNTER — Ambulatory Visit: Payer: BLUE CROSS/BLUE SHIELD | Admitting: Urology

## 2018-02-06 ENCOUNTER — Encounter: Payer: Self-pay | Admitting: Urology

## 2018-02-06 VITALS — BP 125/75 | HR 70 | Ht 61.0 in | Wt 152.0 lb

## 2018-02-06 DIAGNOSIS — N23 Unspecified renal colic: Secondary | ICD-10-CM

## 2018-02-06 DIAGNOSIS — N201 Calculus of ureter: Secondary | ICD-10-CM | POA: Diagnosis not present

## 2018-02-06 LAB — URINALYSIS, COMPLETE
BILIRUBIN UA: NEGATIVE
Glucose, UA: NEGATIVE
KETONES UA: NEGATIVE
NITRITE UA: NEGATIVE
Protein, UA: NEGATIVE
SPEC GRAV UA: 1.025 (ref 1.005–1.030)
UUROB: 0.2 mg/dL (ref 0.2–1.0)
pH, UA: 6 (ref 5.0–7.5)

## 2018-02-06 LAB — MICROSCOPIC EXAMINATION

## 2018-02-06 MED ORDER — SULFAMETHOXAZOLE-TRIMETHOPRIM 800-160 MG PO TABS: 1.0000 | ORAL_TABLET | Freq: Two times a day (BID) | ORAL | 0 refills | Status: AC

## 2018-02-06 NOTE — Telephone Encounter (Signed)
LVM with patient "to contact office" ( to see if she has researched behavioral therapist in the area). I have googled some and came up with some in the area.  I did not want to refer her if she has already initiated elsewhere.  Thanks [employer]le

## 2018-02-06 NOTE — Progress Notes (Signed)
02/06/2018 2:38 PM   Megan Kramer [birth year] [id number]  Referring provider: McLean-Scocuzza, Pasty Spillers, MD [address] [place], [place] [address]  Chief Complaint  Patient presents with  . Nephrolithiasis    7mm stone in RT   . Spasms    Bladder spasms, began in pregnacy getting worse     HPI: Megan Kramer is a 27 year old female seen in consultation at the request of Dr. Judie Grieve for evaluation of a right ureteral calculus.  While pregnant last Spring she had left flank pain and had a renal ultrasound performed which showed left hydronephrosis and hydroureter.  She was also found to have nonobstructing right renal calculi.  After delivery a CT scan was performed in June 2018 which showed resolution of her left hydronephrosis and hydroureter.  There were bilateral nonobstructing renal calculi the largest in the right renal pelvis which measured 8 mm.  She states after delivery she began to have intermittent urinary frequency and urgency and bladder spasms.  A CT scan of the abdomen pelvis with contrast was performed on 01/16/2018 which showed migration of her 8 mm calculus to the right distal ureter.  She denies fever, chills, nausea or vomiting.  She was diagnosed with a stone in high school which she subsequently passed.   PMH: Past Medical History:  Diagnosis Date  . Anxiety   . Asthma   . Heart murmur   . Herpes   . Hypertension    pregnancy induced  . IBS (irritable bowel syndrome)   . Kidney stones   . Obesity (BMI 30-39.9)   . Postpartum depression associated with first pregnancy   . Second pregnancy     Surgical History: Past Surgical History:  Procedure Laterality Date  . CESAREAN SECTION  12/21/2014   Teresa Coombs, [place]  . CESAREAN SECTION N/A 05/27/2017   Procedure: CESAREAN SECTION;  Surgeon: Nadara Mustard, MD;  Location: [facility] ORS;  Service: Obstetrics;  Laterality: N/A;  . OTHER SURGICAL HISTORY     mole bx   . WISDOM TOOTH EXTRACTION       Home Medications:  Allergies as of 02/06/2018      Reactions   Codeine Nausea Only, Nausea And Vomiting   Latex Rash      Medication List        Accurate as of 02/06/18  2:38 PM. Always use your most recent med list.          buPROPion 150 MG 24 hr tablet Commonly known as:  WELLBUTRIN XL Take 1 tablet (150 mg total) by mouth daily.   norgestimate-ethinyl estradiol 0.25-35 MG-MCG tablet Commonly known as:  ORTHO-CYCLEN,SPRINTEC,PREVIFEM Take 1 tablet by mouth daily.   tamsulosin 0.4 MG Caps capsule Commonly known as:  FLOMAX Take 1 capsule (0.4 mg total) by mouth daily after supper. For kidney stones   triamcinolone cream 0.1 % Commonly known as:  KENALOG Apply 1 application topically 2 (two) times daily. Right hip x 7-14 days as needed       Allergies:  Allergies  Allergen Reactions  . Codeine Nausea Only and Nausea And Vomiting  . Latex Rash    Family History: Family History  Problem Relation Age of Onset  . Hypertension Mother   . Depression Mother   . Hyperlipidemia Mother   . Diverticulosis Mother   . Hearing loss Father   . Hypertension Father   . Irritable bowel syndrome Father   . Hyperlipidemia Father   . Breast cancer Maternal Grandmother   .  Miscarriages / Stillbirths Maternal Grandmother   . Cancer Maternal Grandmother        breast   . Diabetes Maternal Grandfather   . Heart disease Maternal Grandfather        MI  . Arthritis Paternal Grandfather   . Irritable bowel syndrome Paternal Aunt   . GER disease Paternal Aunt   . Crohn's disease Paternal Aunt   . Diabetes Other     Social History:  reports that  has never smoked. she has never used smokeless tobacco. She reports that she drinks alcohol. She reports that she does not use drugs.  ROS: UROLOGY Frequent Urination?: Yes Hard to postpone urination?: No Burning/pain with urination?: No Get up at night to urinate?: Yes Leakage of urine?: Yes Urine stream starts and stops?:  No Trouble starting stream?: No Do you have to strain to urinate?: No Blood in urine?: No Urinary tract infection?: Yes Sexually transmitted disease?: No Injury to kidneys or bladder?: No Painful intercourse?: No Weak stream?: Yes Currently pregnant?: No Vaginal bleeding?: No Last menstrual period?: 01/26/2018  Gastrointestinal Nausea?: No Vomiting?: No Indigestion/heartburn?: No Diarrhea?: No Constipation?: No  Constitutional Fever: No Night sweats?: No Weight loss?: No Fatigue?: No  Skin Skin rash/lesions?: No Itching?: No  Eyes Blurred vision?: No Double vision?: No  Ears/Nose/Throat Sore throat?: No Sinus problems?: No  Hematologic/Lymphatic Swollen glands?: No Easy bruising?: No  Cardiovascular Leg swelling?: No Chest pain?: No  Respiratory Cough?: No Shortness of breath?: No  Endocrine Excessive thirst?: No  Musculoskeletal Back pain?: No Joint pain?: No  Neurological Headaches?: No Dizziness?: No  Psychologic Depression?: No Anxiety?: No  Physical Exam: BP 125/75 (BP Location: Left Arm, Patient Position: Sitting, Cuff Size: Normal)   Pulse 70   Ht 5\' 1"  (1.549 m)   Wt 152 lb (68.9 kg)   LMP 01/26/2018   SpO2 98%   BMI 28.72 kg/m   Constitutional:  Alert and oriented, No acute distress. HEENT: [place] AT, moist mucus membranes.  Trachea midline, no masses. Cardiovascular: No clubbing, cyanosis, or edema.  CV RRR Respiratory: Normal respiratory effort, no increased work of breathing.  Lungs clear GI: Abdomen is soft, nontender, nondistended, no abdominal masses GU: No CVA tenderness.  Skin: No rashes, bruises or suspicious lesions. Lymph: No cervical or inguinal adenopathy. Neurologic: Grossly intact, no focal deficits, moving all 4 extremities. Psychiatric: Normal mood and affect.  Laboratory Data: Lab Results  Component Value Date   WBC 6.7 01/02/2018   HGB 15.9 (H) 01/02/2018   HCT 47.7 (H) 01/02/2018   MCV 94.8 01/02/2018    PLT 394.0 01/02/2018    Lab Results  Component Value Date   CREATININE 0.67 01/02/2018    Urinalysis Lab Results  Component Value Date   SPECGRAV 1.020 04/25/2017   PHUR 6.0 04/25/2017   COLORU yellow 04/25/2017   APPEARANCEUR CLEAR 01/02/2018   LEUKOCYTESUR NEGATIVE 01/02/2018   PROTEINUR trace 04/25/2017   GLUCOSEU NEGATIVE 01/02/2018   KETONESU neg 04/25/2017   RBCU 0-2/hpf 01/02/2018   BILIRUBINUR NEGATIVE 01/02/2018   NITRITE NEGATIVE 01/02/2018    Lab Results  Component Value Date   MUCUS Presence of (A) 01/02/2018   BACTERIA RARE (A) 04/15/2017    Pertinent Imaging: CT reviewed   Results for orders placed during the hospital encounter of 05/27/17  CT RENAL STONE STUDY   Narrative CLINICAL DATA:  27 year old female with history of cesarean section yesterday evening. Left lower quadrant pain and flank pain.  EXAM: CT ABDOMEN AND PELVIS  WITHOUT CONTRAST  TECHNIQUE: Multidetector CT imaging of the abdomen and pelvis was performed following the standard protocol without IV contrast.  COMPARISON:  No priors.  FINDINGS: Lower chest: Unremarkable.  Hepatobiliary: No definite cystic or solid hepatic lesions are identified on today's noncontrast CT examination. Unenhanced appearance of the gallbladder is normal.  Pancreas: No definite pancreatic mass or peripancreatic inflammatory changes are noted on today's noncontrast CT examination.  Spleen: Unremarkable.  Adrenals/Urinary Tract: Multiple nonobstructive calculi are present within the collecting systems of the kidneys bilaterally measuring up to 5 mm in the lower pole of the right kidney. In addition, there is an 11 mm calculus in the right renal pelvis. This is not associated with significant hydronephrosis at this time. No additional calculi are identified along the course of either ureter or within the lumen of the urinary bladder. No hydroureter. Small amount of gas non dependently in the lumen  of the urinary bladder, likely iatrogenic.  Stomach/Bowel: Unenhanced appearance of the stomach is normal. There is no pathologic dilatation of small bowel or colon. Normal appendix.  Vascular/Lymphatic: No atherosclerotic calcifications or definite aneurysm in the abdominal or pelvic vasculature on today's noncontrast CT examination. No lymphadenopathy noted in the abdomen or pelvis.  Reproductive: Post gravid uterus with postoperative changes of recent cesarean section, including a small amount of gas in the anterior wall of the lower uterine segment. High attenuation material in the endometrial canal, most evident in the lower uterine segment, presumably a small amount of postoperative hemorrhage. Ovaries are unremarkable in appearance.  Other: Small volume of ascites. Small amount of pneumoperitoneum, presumably iatrogenic. Small amount of gas noted in the anterior abdominal wall musculature, and in the subcutaneous fat, related to recent cesarean section.  Musculoskeletal: There are no aggressive appearing lytic or blastic lesions noted in the visualized portions of the skeleton.  IMPRESSION: 1. Multiple nonobstructive calculi are present within the renal collecting systems bilaterally. The largest of these is 11 mm in the right renal pelvis. No hydronephrosis noted at this time. 2. Postoperative changes of recent cesarean section, as above. High attenuation material in the endometrial canal, most evident in the lower uterine segment, presumably residual postoperative hemorrhage. The possibility of retained products of conception is not excluded, and clinical correlation is recommended. 3. Small amount of gas non dependently in the lumen of the urinary bladder, presumably iatrogenic (assuming the patient had an indwelling Foley catheter during cesarian section).   Electronically Signed   By: Trudie Reed M.D.   On: 05/28/2017 13:36     Assessment & Plan:     27 year old female with an 8 mm right distal ureteral calculus and moderate hydronephrosis/hydroureter.  Based on stone size it is unlikely she will be able to pass.  The stone density is 1300 HU making shockwave lithotripsy less successful.  Based on stone density location I recommended ureteroscopic removal.  She desires to schedule.  The indications and nature of the planned procedure were discussed as well as the potential  benefits and expected outcome.  Alternatives have been discussed in detail. The most common complications and side effects were discussed including but not limited to infection/sepsis; blood loss; damage to urethra, bladder, ureter kidney; need for prolonged stent placement as well as general anesthesia risks. All of her questions were answered and she desires to proceed.  At the time of our visit he she did not appear to be distracted or in pain.  Urinalysis today did show 6-10 WBCs and no significant RBCs.  The urine was sent for culture and Rx Septra DS was sent to her pharmacy pending the culture result.    Riki Altes, MD  [facility] [address], [address] [place], [place] [address] [phone]

## 2018-02-06 NOTE — H&P (View-Only) (Signed)
02/06/2018 2:38 PM   Megan Kramer [birth year] [id number]  Referring provider: McLean-Scocuzza, Pasty Spillers, MD [address] [place], [place] [address]  Chief Complaint  Patient presents with  . Nephrolithiasis    7mm stone in RT   . Spasms    Bladder spasms, began in pregnacy getting worse     HPI: Megan Kramer is a 27 year old female seen in consultation at the request of Dr. Judie Grieve for evaluation of a right ureteral calculus.  While pregnant last Spring she had left flank pain and had a renal ultrasound performed which showed left hydronephrosis and hydroureter.  She was also found to have nonobstructing right renal calculi.  After delivery a CT scan was performed in June 2018 which showed resolution of her left hydronephrosis and hydroureter.  There were bilateral nonobstructing renal calculi the largest in the right renal pelvis which measured 8 mm.  She states after delivery she began to have intermittent urinary frequency and urgency and bladder spasms.  A CT scan of the abdomen pelvis with contrast was performed on 01/16/2018 which showed migration of her 8 mm calculus to the right distal ureter.  She denies fever, chills, nausea or vomiting.  She was diagnosed with a stone in high school which she subsequently passed.   PMH: Past Medical History:  Diagnosis Date  . Anxiety   . Asthma   . Heart murmur   . Herpes   . Hypertension    pregnancy induced  . IBS (irritable bowel syndrome)   . Kidney stones   . Obesity (BMI 30-39.9)   . Postpartum depression associated with first pregnancy   . Second pregnancy     Surgical History: Past Surgical History:  Procedure Laterality Date  . CESAREAN SECTION  12/21/2014   Teresa Coombs, [place]  . CESAREAN SECTION N/A 05/27/2017   Procedure: CESAREAN SECTION;  Surgeon: Nadara Mustard, MD;  Location: [facility] ORS;  Service: Obstetrics;  Laterality: N/A;  . OTHER SURGICAL HISTORY     mole bx   . WISDOM TOOTH EXTRACTION       Home Medications:  Allergies as of 02/06/2018      Reactions   Codeine Nausea Only, Nausea And Vomiting   Latex Rash      Medication List        Accurate as of 02/06/18  2:38 PM. Always use your most recent med list.          buPROPion 150 MG 24 hr tablet Commonly known as:  WELLBUTRIN XL Take 1 tablet (150 mg total) by mouth daily.   norgestimate-ethinyl estradiol 0.25-35 MG-MCG tablet Commonly known as:  ORTHO-CYCLEN,SPRINTEC,PREVIFEM Take 1 tablet by mouth daily.   tamsulosin 0.4 MG Caps capsule Commonly known as:  FLOMAX Take 1 capsule (0.4 mg total) by mouth daily after supper. For kidney stones   triamcinolone cream 0.1 % Commonly known as:  KENALOG Apply 1 application topically 2 (two) times daily. Right hip x 7-14 days as needed       Allergies:  Allergies  Allergen Reactions  . Codeine Nausea Only and Nausea And Vomiting  . Latex Rash    Family History: Family History  Problem Relation Age of Onset  . Hypertension Mother   . Depression Mother   . Hyperlipidemia Mother   . Diverticulosis Mother   . Hearing loss Father   . Hypertension Father   . Irritable bowel syndrome Father   . Hyperlipidemia Father   . Breast cancer Maternal Grandmother   .  Miscarriages / Stillbirths Maternal Grandmother   . Cancer Maternal Grandmother        breast   . Diabetes Maternal Grandfather   . Heart disease Maternal Grandfather        MI  . Arthritis Paternal Grandfather   . Irritable bowel syndrome Paternal Aunt   . GER disease Paternal Aunt   . Crohn's disease Paternal Aunt   . Diabetes Other     Social History:  reports that  has never smoked. she has never used smokeless tobacco. She reports that she drinks alcohol. She reports that she does not use drugs.  ROS: UROLOGY Frequent Urination?: Yes Hard to postpone urination?: No Burning/pain with urination?: No Get up at night to urinate?: Yes Leakage of urine?: Yes Urine stream starts and stops?:  No Trouble starting stream?: No Do you have to strain to urinate?: No Blood in urine?: No Urinary tract infection?: Yes Sexually transmitted disease?: No Injury to kidneys or bladder?: No Painful intercourse?: No Weak stream?: Yes Currently pregnant?: No Vaginal bleeding?: No Last menstrual period?: 01/26/2018  Gastrointestinal Nausea?: No Vomiting?: No Indigestion/heartburn?: No Diarrhea?: No Constipation?: No  Constitutional Fever: No Night sweats?: No Weight loss?: No Fatigue?: No  Skin Skin rash/lesions?: No Itching?: No  Eyes Blurred vision?: No Double vision?: No  Ears/Nose/Throat Sore throat?: No Sinus problems?: No  Hematologic/Lymphatic Swollen glands?: No Easy bruising?: No  Cardiovascular Leg swelling?: No Chest pain?: No  Respiratory Cough?: No Shortness of breath?: No  Endocrine Excessive thirst?: No  Musculoskeletal Back pain?: No Joint pain?: No  Neurological Headaches?: No Dizziness?: No  Psychologic Depression?: No Anxiety?: No  Physical Exam: BP 125/75 (BP Location: Left Arm, Patient Position: Sitting, Cuff Size: Normal)   Pulse 70   Ht 5\' 1"  (1.549 m)   Wt 152 lb (68.9 kg)   LMP 01/26/2018   SpO2 98%   BMI 28.72 kg/m   Constitutional:  Alert and oriented, No acute distress. HEENT: [place] AT, moist mucus membranes.  Trachea midline, no masses. Cardiovascular: No clubbing, cyanosis, or edema.  CV RRR Respiratory: Normal respiratory effort, no increased work of breathing.  Lungs clear GI: Abdomen is soft, nontender, nondistended, no abdominal masses GU: No CVA tenderness.  Skin: No rashes, bruises or suspicious lesions. Lymph: No cervical or inguinal adenopathy. Neurologic: Grossly intact, no focal deficits, moving all 4 extremities. Psychiatric: Normal mood and affect.  Laboratory Data: Lab Results  Component Value Date   WBC 6.7 01/02/2018   HGB 15.9 (H) 01/02/2018   HCT 47.7 (H) 01/02/2018   MCV 94.8 01/02/2018    PLT 394.0 01/02/2018    Lab Results  Component Value Date   CREATININE 0.67 01/02/2018    Urinalysis Lab Results  Component Value Date   SPECGRAV 1.020 04/25/2017   PHUR 6.0 04/25/2017   COLORU yellow 04/25/2017   APPEARANCEUR CLEAR 01/02/2018   LEUKOCYTESUR NEGATIVE 01/02/2018   PROTEINUR trace 04/25/2017   GLUCOSEU NEGATIVE 01/02/2018   KETONESU neg 04/25/2017   RBCU 0-2/hpf 01/02/2018   BILIRUBINUR NEGATIVE 01/02/2018   NITRITE NEGATIVE 01/02/2018    Lab Results  Component Value Date   MUCUS Presence of (A) 01/02/2018   BACTERIA RARE (A) 04/15/2017    Pertinent Imaging: CT reviewed   Results for orders placed during the hospital encounter of 05/27/17  CT RENAL STONE STUDY   Narrative CLINICAL DATA:  27 year old female with history of cesarean section yesterday evening. Left lower quadrant pain and flank pain.  EXAM: CT ABDOMEN AND PELVIS  WITHOUT CONTRAST  TECHNIQUE: Multidetector CT imaging of the abdomen and pelvis was performed following the standard protocol without IV contrast.  COMPARISON:  No priors.  FINDINGS: Lower chest: Unremarkable.  Hepatobiliary: No definite cystic or solid hepatic lesions are identified on today's noncontrast CT examination. Unenhanced appearance of the gallbladder is normal.  Pancreas: No definite pancreatic mass or peripancreatic inflammatory changes are noted on today's noncontrast CT examination.  Spleen: Unremarkable.  Adrenals/Urinary Tract: Multiple nonobstructive calculi are present within the collecting systems of the kidneys bilaterally measuring up to 5 mm in the lower pole of the right kidney. In addition, there is an 11 mm calculus in the right renal pelvis. This is not associated with significant hydronephrosis at this time. No additional calculi are identified along the course of either ureter or within the lumen of the urinary bladder. No hydroureter. Small amount of gas non dependently in the lumen  of the urinary bladder, likely iatrogenic.  Stomach/Bowel: Unenhanced appearance of the stomach is normal. There is no pathologic dilatation of small bowel or colon. Normal appendix.  Vascular/Lymphatic: No atherosclerotic calcifications or definite aneurysm in the abdominal or pelvic vasculature on today's noncontrast CT examination. No lymphadenopathy noted in the abdomen or pelvis.  Reproductive: Post gravid uterus with postoperative changes of recent cesarean section, including a small amount of gas in the anterior wall of the lower uterine segment. High attenuation material in the endometrial canal, most evident in the lower uterine segment, presumably a small amount of postoperative hemorrhage. Ovaries are unremarkable in appearance.  Other: Small volume of ascites. Small amount of pneumoperitoneum, presumably iatrogenic. Small amount of gas noted in the anterior abdominal wall musculature, and in the subcutaneous fat, related to recent cesarean section.  Musculoskeletal: There are no aggressive appearing lytic or blastic lesions noted in the visualized portions of the skeleton.  IMPRESSION: 1. Multiple nonobstructive calculi are present within the renal collecting systems bilaterally. The largest of these is 11 mm in the right renal pelvis. No hydronephrosis noted at this time. 2. Postoperative changes of recent cesarean section, as above. High attenuation material in the endometrial canal, most evident in the lower uterine segment, presumably residual postoperative hemorrhage. The possibility of retained products of conception is not excluded, and clinical correlation is recommended. 3. Small amount of gas non dependently in the lumen of the urinary bladder, presumably iatrogenic (assuming the patient had an indwelling Foley catheter during cesarian section).   Electronically Signed   By: Trudie Reed M.D.   On: 05/28/2017 13:36     Assessment & Plan:     27 year old female with an 8 mm right distal ureteral calculus and moderate hydronephrosis/hydroureter.  Based on stone size it is unlikely she will be able to pass.  The stone density is 1300 HU making shockwave lithotripsy less successful.  Based on stone density location I recommended ureteroscopic removal.  She desires to schedule.  The indications and nature of the planned procedure were discussed as well as the potential  benefits and expected outcome.  Alternatives have been discussed in detail. The most common complications and side effects were discussed including but not limited to infection/sepsis; blood loss; damage to urethra, bladder, ureter kidney; need for prolonged stent placement as well as general anesthesia risks. All of her questions were answered and she desires to proceed.  At the time of our visit he she did not appear to be distracted or in pain.  Urinalysis today did show 6-10 WBCs and no significant RBCs.  The urine was sent for culture and Rx Septra DS was sent to her pharmacy pending the culture result.    Riki Altes, MD  [facility] [address], [address] [place], [place] [address] [phone]

## 2018-02-09 ENCOUNTER — Encounter: Payer: Self-pay | Admitting: Urology

## 2018-02-11 LAB — CULTURE, URINE COMPREHENSIVE

## 2018-02-13 NOTE — Telephone Encounter (Signed)
Letter mailed to pt regarding Cognitive therapy since we have been unable to contact pt. Inquired whether or not appt has been made and if not, we would assist if needed.

## 2018-02-20 ENCOUNTER — Other Ambulatory Visit: Payer: Self-pay

## 2018-02-20 ENCOUNTER — Encounter
Admission: RE | Admit: 2018-02-20 | Discharge: 2018-02-20 | Disposition: A | Discharge status: Home / Self Care | Payer: BLUE CROSS/BLUE SHIELD | Source: Ambulatory Visit | Attending: Urology | Admitting: Urology

## 2018-02-20 HISTORY — DX: Cardiac arrhythmia, unspecified: I49.9

## 2018-02-20 HISTORY — DX: Personal history of urinary calculi: Z87.442

## 2018-02-20 HISTORY — DX: Family history of other specified conditions: Z84.89

## 2018-02-20 NOTE — Patient Instructions (Signed)
Your procedure is scheduled on: 02-27-18  Report to Same Day Surgery 2nd floor medical mall [facility](Medical Mall Entrance-take elevator on left to 2nd floor.  Check in with surgery information desk.) To find out your arrival time please call [phone] between 1PM - 3PM on 02-26-18  Remember: Instructions that are not followed completely may result in serious medical risk, up to and including death, or upon the discretion of your surgeon and anesthesiologist your surgery may need to be rescheduled.    _x___ 1. Do not eat food after midnight the night before your procedure. You may drink clear liquids up to 2 hours before you are scheduled to arrive at the hospital for your procedure.  Do not drink clear liquids within 2 hours of your scheduled arrival to the hospital.  Clear liquids include  --Water or Apple juice without pulp  --Clear carbohydrate beverage such as ClearFast or Gatorade  --Black Coffee or Clear Tea (No milk, no creamers, do not add anything to the coffee or Tea Type 1 and type 2 diabetics should only drink water.  No gum chewing or hard candies.     __x__ 2. No Alcohol for 24 hours before or after surgery.   __x__3. No Smoking or e-cigarettes for 24 prior to surgery.  Do not use any chewable tobacco products for at least 6 hour prior to surgery   ____  4. Bring all medications with you on the day of surgery if instructed.    __x__ 5. Notify your doctor if there is any change in your medical condition     (cold, fever, infections).    x___6. On the morning of surgery brush your teeth with toothpaste and water.  You may rinse your mouth with mouth wash if you wish.  Do not swallow any toothpaste or mouthwash.   Do not wear jewelry, make-up, hairpins, clips or nail polish.  Do not wear lotions, powders, or perfumes. You may wear deodorant.  Do not shave 48 hours prior to surgery. Men may shave face and neck.  Do not bring valuables to the hospital.    [facility] is not  responsible for any belongings or valuables.               Contacts, dentures or bridgework may not be worn into surgery.  Leave your suitcase in the car. After surgery it may be brought to your room.  For patients admitted to the hospital, discharge time is determined by your treatment team.  _  Patients discharged the day of surgery will not be allowed to drive home.  You will need someone to drive you home and stay with you the night of your procedure.    Please read over the following fact sheets that you were given:   [facility] Preparing for [facility] and or MRSA Information   _x___ TAKE THE FOLLOWING MEDICATION THE MORNING OF SURGERY WITH A SMALL SIP OF WATER. These include:  1. FLOMAX  2.  3.  4.  5.  6.  ____Fleets enema or Magnesium Citrate as directed.   ____ Use CHG Soap or sage wipes as directed on instruction sheet   ____ Use inhalers on the day of surgery and bring to hospital day of surgery  ____ Stop Metformin and Janumet 2 days prior to surgery.    ____ Take 1/2 of usual insulin dose the night before surgery and none on the morning surgery.   ____ Follow recommendations from Cardiologist, Pulmonologist or PCP regarding  stopping Aspirin, Coumadin, Plavix ,Eliquis, Effient, or Pradaxa, and Pletal.  X____Stop Anti-inflammatories such as Advil, Aleve, Ibuprofen, Motrin, Naproxen, Naprosyn, Goodies powders or aspirin products NOW-OK to take Tylenol    ____ Stop supplements until after surgery.    ____ Bring C-Pap to the hospital.

## 2018-02-24 ENCOUNTER — Other Ambulatory Visit: Payer: Self-pay | Admitting: Obstetrics & Gynecology

## 2018-02-24 MED ORDER — BUPROPION HCL ER (XL) 150 MG PO TB24: 150.0000 mg | ORAL_TABLET | Freq: Every day | ORAL | 5 refills | Status: DC

## 2018-02-26 ENCOUNTER — Encounter: Payer: Self-pay | Admitting: *Deleted

## 2018-02-26 MED ORDER — CEFAZOLIN SODIUM-DEXTROSE 2-4 GM/100ML-% IV SOLN
2.0000 g | INTRAVENOUS | Status: AC
  Administered 2018-02-27: 2 g via INTRAVENOUS

## 2018-02-27 ENCOUNTER — Encounter: Payer: Self-pay | Admitting: *Deleted

## 2018-02-27 ENCOUNTER — Ambulatory Visit: Payer: BLUE CROSS/BLUE SHIELD | Admitting: Internal Medicine

## 2018-02-27 ENCOUNTER — Ambulatory Visit: Payer: BLUE CROSS/BLUE SHIELD | Admitting: Anesthesiology

## 2018-02-27 ENCOUNTER — Encounter
Admission: RE | Disposition: A | Discharge status: Home / Self Care | Payer: Self-pay | Source: Ambulatory Visit | Attending: Urology

## 2018-02-27 ENCOUNTER — Ambulatory Visit
Admission: RE | Admit: 2018-02-27 | Discharge: 2018-02-27 | Disposition: A | Discharge status: Home / Self Care | Payer: BLUE CROSS/BLUE SHIELD | Source: Ambulatory Visit | Attending: Urology | Admitting: Urology

## 2018-02-27 DIAGNOSIS — E669 Obesity, unspecified: Secondary | ICD-10-CM | POA: Diagnosis not present

## 2018-02-27 DIAGNOSIS — Z833 Family history of diabetes mellitus: Secondary | ICD-10-CM | POA: Insufficient documentation

## 2018-02-27 DIAGNOSIS — N200 Calculus of kidney: Secondary | ICD-10-CM

## 2018-02-27 DIAGNOSIS — Z885 Allergy status to narcotic agent status: Secondary | ICD-10-CM | POA: Diagnosis not present

## 2018-02-27 DIAGNOSIS — I10 Essential (primary) hypertension: Secondary | ICD-10-CM | POA: Insufficient documentation

## 2018-02-27 DIAGNOSIS — J45909 Unspecified asthma, uncomplicated: Secondary | ICD-10-CM | POA: Insufficient documentation

## 2018-02-27 DIAGNOSIS — K589 Irritable bowel syndrome without diarrhea: Secondary | ICD-10-CM | POA: Diagnosis not present

## 2018-02-27 DIAGNOSIS — N201 Calculus of ureter: Secondary | ICD-10-CM | POA: Diagnosis not present

## 2018-02-27 DIAGNOSIS — Z8379 Family history of other diseases of the digestive system: Secondary | ICD-10-CM | POA: Diagnosis not present

## 2018-02-27 DIAGNOSIS — Z8249 Family history of ischemic heart disease and other diseases of the circulatory system: Secondary | ICD-10-CM | POA: Insufficient documentation

## 2018-02-27 DIAGNOSIS — R011 Cardiac murmur, unspecified: Secondary | ICD-10-CM | POA: Diagnosis not present

## 2018-02-27 DIAGNOSIS — N132 Hydronephrosis with renal and ureteral calculous obstruction: Secondary | ICD-10-CM | POA: Diagnosis present

## 2018-02-27 DIAGNOSIS — Z79899 Other long term (current) drug therapy: Secondary | ICD-10-CM | POA: Diagnosis not present

## 2018-02-27 DIAGNOSIS — Z803 Family history of malignant neoplasm of breast: Secondary | ICD-10-CM | POA: Diagnosis not present

## 2018-02-27 DIAGNOSIS — F419 Anxiety disorder, unspecified: Secondary | ICD-10-CM | POA: Insufficient documentation

## 2018-02-27 DIAGNOSIS — Z818 Family history of other mental and behavioral disorders: Secondary | ICD-10-CM | POA: Diagnosis not present

## 2018-02-27 DIAGNOSIS — Z8261 Family history of arthritis: Secondary | ICD-10-CM | POA: Insufficient documentation

## 2018-02-27 DIAGNOSIS — Z87442 Personal history of urinary calculi: Secondary | ICD-10-CM | POA: Insufficient documentation

## 2018-02-27 DIAGNOSIS — Z91040 Latex allergy status: Secondary | ICD-10-CM | POA: Diagnosis not present

## 2018-02-27 HISTORY — PX: CYSTOSCOPY/URETEROSCOPY/HOLMIUM LASER/STENT PLACEMENT: SHX6546

## 2018-02-27 LAB — POCT PREGNANCY, URINE: Preg Test, Ur: NEGATIVE

## 2018-02-27 SURGERY — CYSTOSCOPY/URETEROSCOPY/HOLMIUM LASER/STENT PLACEMENT
Anesthesia: General | Laterality: Right | Wound class: Clean Contaminated

## 2018-02-27 MED ORDER — ONDANSETRON HCL 4 MG/2ML IJ SOLN: 4.0000 mg | Freq: Once | INTRAMUSCULAR | Status: DC | PRN

## 2018-02-27 MED ORDER — DEXAMETHASONE SODIUM PHOSPHATE 10 MG/ML IJ SOLN
INTRAMUSCULAR | Status: DC | PRN
  Administered 2018-02-27: 10 mg via INTRAVENOUS

## 2018-02-27 MED ORDER — FENTANYL CITRATE (PF) 100 MCG/2ML IJ SOLN
INTRAMUSCULAR | Status: AC
  Filled 2018-02-27: qty 2

## 2018-02-27 MED ORDER — SEVOFLURANE IN SOLN
RESPIRATORY_TRACT | Status: AC
  Filled 2018-02-27: qty 250

## 2018-02-27 MED ORDER — FENTANYL CITRATE (PF) 100 MCG/2ML IJ SOLN
INTRAMUSCULAR | Status: DC | PRN
  Administered 2018-02-27: 100 ug via INTRAVENOUS

## 2018-02-27 MED ORDER — GLYCOPYRROLATE 0.2 MG/ML IJ SOLN
INTRAMUSCULAR | Status: DC | PRN
  Administered 2018-02-27: 0.6 mg via INTRAVENOUS

## 2018-02-27 MED ORDER — FAMOTIDINE 20 MG PO TABS
ORAL_TABLET | ORAL | Status: AC
  Administered 2018-02-27: 20 mg via ORAL
  Filled 2018-02-27: qty 1

## 2018-02-27 MED ORDER — OXYBUTYNIN CHLORIDE 5 MG PO TABS
ORAL_TABLET | ORAL | Status: AC
  Administered 2018-02-27: 5 mg via ORAL
  Filled 2018-02-27: qty 1

## 2018-02-27 MED ORDER — PROPOFOL 10 MG/ML IV BOLUS
INTRAVENOUS | Status: AC
  Filled 2018-02-27: qty 60

## 2018-02-27 MED ORDER — LIDOCAINE HCL (CARDIAC) 20 MG/ML IV SOLN
INTRAVENOUS | Status: DC | PRN
  Administered 2018-02-27: 80 mg via INTRAVENOUS

## 2018-02-27 MED ORDER — OXYBUTYNIN CHLORIDE 5 MG PO TABS
5.0000 mg | ORAL_TABLET | Freq: Once | ORAL | Status: AC
  Administered 2018-02-27: 5 mg via ORAL
  Filled 2018-02-27: qty 1

## 2018-02-27 MED ORDER — LACTATED RINGERS IV SOLN
INTRAVENOUS | Status: DC | PRN
  Administered 2018-02-27: 07:00:00 via INTRAVENOUS

## 2018-02-27 MED ORDER — FAMOTIDINE 20 MG PO TABS
20.0000 mg | ORAL_TABLET | Freq: Once | ORAL | Status: AC
  Administered 2018-02-27: 20 mg via ORAL

## 2018-02-27 MED ORDER — IOTHALAMATE MEGLUMINE 43 % IV SOLN
INTRAVENOUS | Status: DC | PRN
  Administered 2018-02-27: 15 mL

## 2018-02-27 MED ORDER — MIDAZOLAM HCL 2 MG/2ML IJ SOLN
INTRAMUSCULAR | Status: DC | PRN
  Administered 2018-02-27: 2 mg via INTRAVENOUS

## 2018-02-27 MED ORDER — MIDAZOLAM HCL 2 MG/2ML IJ SOLN
INTRAMUSCULAR | Status: AC
  Filled 2018-02-27: qty 2

## 2018-02-27 MED ORDER — CEFAZOLIN SODIUM-DEXTROSE 2-4 GM/100ML-% IV SOLN
INTRAVENOUS | Status: AC
  Filled 2018-02-27: qty 100

## 2018-02-27 MED ORDER — OXYCODONE-ACETAMINOPHEN 5-325 MG PO TABS: 1.0000 | ORAL_TABLET | Freq: Four times a day (QID) | ORAL | 0 refills | Status: DC | PRN

## 2018-02-27 MED ORDER — ROCURONIUM BROMIDE 100 MG/10ML IV SOLN
INTRAVENOUS | Status: DC | PRN
  Administered 2018-02-27: 30 mg via INTRAVENOUS

## 2018-02-27 MED ORDER — ONDANSETRON HCL 4 MG/2ML IJ SOLN
INTRAMUSCULAR | Status: DC | PRN
  Administered 2018-02-27: 4 mg via INTRAVENOUS

## 2018-02-27 MED ORDER — NEOSTIGMINE METHYLSULFATE 10 MG/10ML IV SOLN
INTRAVENOUS | Status: DC | PRN
  Administered 2018-02-27: 5 mg via INTRAVENOUS

## 2018-02-27 MED ORDER — LACTATED RINGERS IV SOLN
INTRAVENOUS | Status: DC
  Administered 2018-02-27: 07:00:00 via INTRAVENOUS

## 2018-02-27 MED ORDER — TAMSULOSIN HCL 0.4 MG PO CAPS: 0.4000 mg | ORAL_CAPSULE | Freq: Every day | ORAL | 0 refills | Status: DC

## 2018-02-27 MED ORDER — OXYBUTYNIN CHLORIDE 5 MG PO TABS: ORAL_TABLET | ORAL | 0 refills | Status: DC

## 2018-02-27 MED ORDER — PROPOFOL 10 MG/ML IV BOLUS
INTRAVENOUS | Status: DC | PRN
  Administered 2018-02-27: 200 mg via INTRAVENOUS

## 2018-02-27 MED ORDER — FENTANYL CITRATE (PF) 100 MCG/2ML IJ SOLN: 25.0000 ug | INTRAMUSCULAR | Status: DC | PRN

## 2018-02-27 SURGICAL SUPPLY — 28 items
BAG DRAIN CYSTO-URO LG1000N (MISCELLANEOUS) ×2 IMPLANT
BASKET ZERO TIP 1.9FR (BASKET) ×2 IMPLANT
BRUSH SCRUB EZ 1% IODOPHOR (MISCELLANEOUS) ×2 IMPLANT
CATH URETL 5X70 OPEN END (CATHETERS) IMPLANT
CNTNR SPEC 2.5X3XGRAD LEK (MISCELLANEOUS) ×1
CONRAY 43 FOR UROLOGY 50M (MISCELLANEOUS) ×2 IMPLANT
CONT SPEC 4OZ STER OR WHT (MISCELLANEOUS) ×1
CONTAINER SPEC 2.5X3XGRAD LEK (MISCELLANEOUS) ×1 IMPLANT
DRAPE UTILITY 15X26 TOWEL STRL (DRAPES) ×2 IMPLANT
FIBER LASER LITHO 273 (Laser) IMPLANT
GLOVE BIO SURGEON STRL SZ8 (GLOVE) ×2 IMPLANT
GOWN STRL REUS W/ TWL LRG LVL3 (GOWN DISPOSABLE) ×2 IMPLANT
GOWN STRL REUS W/TWL LRG LVL3 (GOWN DISPOSABLE) ×2
GUIDEWIRE GREEN .038 145CM (MISCELLANEOUS) IMPLANT
INFUSOR MANOMETER BAG 3000ML (MISCELLANEOUS) ×2 IMPLANT
INTRODUCER DILATOR DOUBLE (INTRODUCER) IMPLANT
KIT TURNOVER CYSTO (KITS) ×2 IMPLANT
PACK CYSTO AR (MISCELLANEOUS) ×2 IMPLANT
SENSORWIRE 0.038 NOT ANGLED (WIRE) ×4
SET CYSTO W/LG BORE CLAMP LF (SET/KITS/TRAYS/PACK) ×2 IMPLANT
SHEATH URETERAL 12FRX35CM (MISCELLANEOUS) IMPLANT
SOL .9 NS 3000ML IRR  AL (IV SOLUTION) ×1
SOL .9 NS 3000ML IRR UROMATIC (IV SOLUTION) ×1 IMPLANT
STENT URET 6FRX24 CONTOUR (STENTS) ×2 IMPLANT
STENT URET 6FRX26 CONTOUR (STENTS) IMPLANT
SURGILUBE 2OZ TUBE FLIPTOP (MISCELLANEOUS) ×2 IMPLANT
WATER STERILE IRR 1000ML POUR (IV SOLUTION) ×2 IMPLANT
WIRE SENSOR 0.038 NOT ANGLED (WIRE) ×2 IMPLANT

## 2018-02-27 NOTE — Transfer of Care (Signed)
Immediate Anesthesia Transfer of Care Note  Patient: Megan Kramer  Procedure(s) Performed: CYSTOSCOPY/URETEROSCOPY/HOLMIUM LASER/STENT PLACEMENT (Right )  Patient Location: PACU  Anesthesia Type:General  Level of Consciousness: awake  Airway & Oxygen Therapy: Patient Spontanous Breathing  Post-op Assessment: Report given to RN  Post vital signs: stable  Last Vitals:  Vitals:   02/27/18 0613 02/27/18 0858  BP: (!) 150/108 (!) 147/92  Pulse: 97 92  Resp: 20 14  Temp: (!) 36.4 C 36.9 C  SpO2: 96% 100%    Last Pain:  Vitals:   02/27/18 0858  PainSc: 0-No pain         Complications: No apparent anesthesia complications

## 2018-02-27 NOTE — Anesthesia Procedure Notes (Signed)
Procedure Name: Intubation Date/Time: 02/27/2018 7:43 AM Performed by: Leander Rams, CRNA Pre-anesthesia Checklist: Patient identified, Emergency Drugs available, Suction available, Patient being monitored and Timeout performed Patient Re-evaluated:Patient Re-evaluated prior to induction Oxygen Delivery Method: Circle system utilized Induction Type: IV induction Ventilation: Mask ventilation without difficulty Laryngoscope Size: Mac and 3 Laser Tube: Cuffed inflated with minimal occlusive pressure - saline Tube size: 7.0 mm Number of attempts: 1 Airway Equipment and Method: Stylet Secured at: 21 cm Tube secured with: Tape Dental Injury: Teeth and Oropharynx as per pre-operative assessment

## 2018-02-27 NOTE — Discharge Instructions (Signed)
AMBULATORY SURGERY  DISCHARGE INSTRUCTIONS   1) The drugs that you were given will stay in your system until tomorrow so for the next 24 hours you should not:  A) Drive an automobile B) Make any legal decisions C) Drink any alcoholic beverage   2) You may resume regular meals tomorrow.  Today it is better to start with liquids and gradually work up to solid foods.  You may eat anything you prefer, but it is better to start with liquids, then soup and crackers, and gradually work up to solid foods.   3) Please notify your doctor immediately if you have any unusual bleeding, trouble breathing, redness and pain at the surgery site, drainage, fever, or pain not relieved by medication.    4) Additional Instructions: Strain all urine, call for fever, inability to urinate or any other concerns.        Please contact your physician with any problems or Same Day Surgery at [phone], Monday through Friday 6 am to 4 pm, or [place] at [facility] Main number at [phone].

## 2018-02-27 NOTE — Op Note (Signed)
Preoperative diagnosis: Right distal ureteral calculus  Postoperative diagnosis: Right distal ureteral calculus  Procedure:  1. Cystoscopy 2. Right ureteroscopy and stone removal 3. Ureteroscopic laser lithotripsy 4. Right ureteral stent placement 6 French/24 cm 5. Right retrograde pyelography with interpretation  Surgeon: Lorin PicketScott C. Kayren Holck, M.D.  Anesthesia: General  Complications: None  Intraoperative findings:  1.  Right retrograde pyelography post procedure showed no filling defects, stone fragments or contrast extravasation.  There was moderate hydroureter and right hydronephrosis down to the distal ureter.  EBL: Minimal  Specimens: 1. Calculus fragments for analysis   Indication: Megan Kramer is a 27 y.o. year old patient with an 8 mm right distal ureteral calculus. After reviewing the management options for treatment, the patient elected to proceed with the above surgical procedure(s). We have discussed the potential benefits and risks of the procedure, side effects of the proposed treatment, the likelihood of the patient achieving the goals of the procedure, and any potential problems that might occur during the procedure or recuperation. Informed consent has been obtained.  Description of procedure:  The patient was taken to the operating room and general anesthesia was induced.  The patient was placed in the dorsal lithotomy position, prepped and draped in the usual sterile fashion, and preoperative antibiotics were administered. A preoperative time-out was performed.   A 22 French cystoscope was lubricated and passed under direct vision.  The urethra was normal in appearance.  Panendoscopy was performed and the bladder mucosa showed no erythema, solid or papillary lesions.  Attention was directed to the right ureteral orifice and a 0.038 Sensor wire was then advanced up the right ureter into the renal pelvis under fluoroscopic guidance.  A 4.5 Fr semirigid  ureteroscope was then advanced into the ureter next to the guidewire and the calculus was identified.  There was mild to moderate mucosal edema just distal to the stone.    A 273 m holmium laser fiber was placed through the ureteroscope and the stone was then fragmented on a setting of 0.8 J and frequency of 8 Hz.   All stones were then removed from the ureter with a zero tip nitinol basket.  Reinspection of the ureter revealed no remaining visible stones or fragments.  The right ureter was dilated proximal to the stone.  Retrograde pyelogram was performed with findings as described above.  A 6 FR/ 24 cm stent with string was placed under fluoroscopic guidance.  The wire was then removed with an adequate stent curl noted in the renal pelvis as well as in the bladder.  The cystoscope was repassed and stone fragments were removed with fill/drain.  The bladder was emptied and the cystoscope was then removed.  The patient appeared to tolerate the procedure well and without complications.  After anesthetic reversal the patient was transported to the PACU in stable condition.   She was instructed to remove her ureteral stent in 7 days.   Riki AltesScott C Waqas Bruhl, MD

## 2018-02-27 NOTE — Interval H&P Note (Signed)
History and Physical Interval Note:  02/27/2018 7:22 AM  Megan Kramer  has presented today for surgery, with the diagnosis of right ureteral calculus  The various methods of treatment have been discussed with the patient and family. After consideration of risks, benefits and other options for treatment, the patient has consented to  Procedure(s): CYSTOSCOPY/URETEROSCOPY/HOLMIUM LASER/STENT PLACEMENT (Right) as a surgical intervention .  The patient's history has been reviewed, patient examined, no change in status, stable for surgery.  I have reviewed the patient's chart and labs.  Questions were answered to the patient's satisfaction.     Scott C Stoioff

## 2018-02-27 NOTE — Anesthesia Postprocedure Evaluation (Signed)
Anesthesia Post Note  Patient: Dorothy SparkBrooke E Barcelo  Procedure(s) Performed: CYSTOSCOPY/URETEROSCOPY/HOLMIUM LASER/STENT PLACEMENT (Right )  Patient location during evaluation: PACU Anesthesia Type: General Level of consciousness: awake and alert and oriented Pain management: pain level controlled Vital Signs Assessment: post-procedure vital signs reviewed and stable Respiratory status: spontaneous breathing Cardiovascular status: blood pressure returned to baseline Anesthetic complications: no     Last Vitals:  Vitals:   02/27/18 0926 02/27/18 0944  BP: (!) 145/71 134/82  Pulse: 61 63  Resp: 20 20  Temp: 37.1 C 37.2 C  SpO2: 100% 99%    Last Pain:  Vitals:   02/27/18 0947  TempSrc:   PainSc: 2                  Andrey Mccaskill

## 2018-02-27 NOTE — Anesthesia Preprocedure Evaluation (Signed)
Anesthesia Evaluation  Patient identified by MRN, date of birth, ID band Patient awake    Reviewed: Allergy & Precautions, H&P , NPO status , Patient's Chart, lab work & pertinent test results, reviewed documented beta blocker date and time   History of Anesthesia Complications (+) Family history of anesthesia reactionNegative for: history of anesthetic complications  Airway Mallampati: III  TM Distance: >3 FB Neck ROM: full    Dental  (+) Teeth Intact, Dental Advidsory Given Permanent retainer:   Pulmonary neg shortness of breath, asthma , neg sleep apnea, neg COPD, neg recent URI,           Cardiovascular Exercise Tolerance: Good hypertension, (-) angina(-) CAD, (-) Past MI, (-) Cardiac Stents and (-) CABG (-) dysrhythmias + Valvular Problems/Murmurs (as a child)      Neuro/Psych Anxiety Depression negative neurological ROS  negative psych ROS   GI/Hepatic Neg liver ROS, GERD  ,  Endo/Other  negative endocrine ROS  Renal/GU Renal disease (kidney stones)  negative genitourinary   Musculoskeletal   Abdominal   Peds  Hematology negative hematology ROS (+)   Anesthesia Other Findings Past Medical History: No date: Anxiety No date: Herpes No date: Hypertension     Comment: pregnancy induced No date: Kidney stones No date: Obesity (BMI 30-39.9) No date: Postpartum depression associated with first preg   Reproductive/Obstetrics                             Anesthesia Physical  Anesthesia Plan  ASA: III  Anesthesia Plan: General   Post-op Pain Management:    Induction:   PONV Risk Score and Plan: 3 and Ondansetron  Airway Management Planned: Oral ETT  Additional Equipment:   Intra-op Plan:   Post-operative Plan: Extubation in OR  Informed Consent: I have reviewed the patients History and Physical, chart, labs and discussed the procedure including the risks, benefits and  alternatives for the proposed anesthesia with the patient or authorized representative who has indicated his/her understanding and acceptance.   Dental Advisory Given  Plan Discussed with: Anesthesiologist, CRNA and Surgeon  Anesthesia Plan Comments:         Anesthesia Quick Evaluation

## 2018-02-27 NOTE — Anesthesia Post-op Follow-up Note (Signed)
Anesthesia QCDR form completed.        

## 2018-02-27 NOTE — OR Nursing (Signed)
String visualized, patient and husband did as well and instructed on removal.

## 2018-03-03 LAB — STONE ANALYSIS
CA PHOS CRY STONE QL IR: 15 %
Ca Oxalate,Dihydrate: 25 %
Ca Oxalate,Monohydr.: 60 %
Stone Weight KSTONE: 40 mg

## 2018-03-05 ENCOUNTER — Ambulatory Visit: Payer: BLUE CROSS/BLUE SHIELD

## 2018-03-27 ENCOUNTER — Encounter: Payer: Self-pay | Admitting: Urology

## 2018-03-27 ENCOUNTER — Ambulatory Visit (INDEPENDENT_AMBULATORY_CARE_PROVIDER_SITE_OTHER): Payer: BLUE CROSS/BLUE SHIELD | Admitting: Urology

## 2018-03-27 VITALS — BP 148/85 | HR 60 | Resp 16 | Ht 60.0 in | Wt 143.6 lb

## 2018-03-27 DIAGNOSIS — Z87442 Personal history of urinary calculi: Secondary | ICD-10-CM | POA: Diagnosis not present

## 2018-03-27 NOTE — Progress Notes (Signed)
03/27/2018 10:59 AM   Megan SparkBrooke E Kramer Feb 05, 1991 [id number]  Referring provider: McLean-Scocuzza, Pasty Spillersracy N, MD [address] [place], [place] [id number]  Chief complaint: Follow-up  HPI: 27 year old female presents for postop follow-up. She is status post ureteroscopic removal of an 8 mm right distal ureteral calculus on 02/27/2018.  Her stent was removed 7 days postop.  She had no postoperative problems and all of her irritative voiding symptoms have resolved.  She notes occasional mild right back pain with prolonged standing.  Stone analysis was 25% calcium oxalate monohydrate/60% calcium oxalate dihydrate/15% calcium phosphate.  She had a previous stone at age 27 which she passed however was not able to retrieve.  PMH: Past Medical History:  Diagnosis Date  . Anxiety   . Asthma    EXERCISE INDUCED-NO INHALER NOW  . Dysrhythmia    TACHYCARDIA WITH 1ST PREGNANCY-ECHO DONE AND WAS WNL-ASYMPTOMATIC NOW  . Family history of adverse reaction to anesthesia    MOM- N/V  . Heart murmur    AS A CHILD-ASYMPTOMATIC  . Herpes   . History of kidney stones   . Hypertension    pregnancy induced  . IBS (irritable bowel syndrome)   . Obesity (BMI 30-39.9)   . Postpartum depression associated with first pregnancy   . Second pregnancy     Surgical History: Past Surgical History:  Procedure Laterality Date  . CESAREAN SECTION  12/21/2014   Teresa Coombsuter [place], [place]  . CESAREAN SECTION N/A 05/27/2017   Procedure: CESAREAN SECTION;  Surgeon: Nadara MustardHarris, Robert P, MD;  Location: [facility];  Service: Obstetrics;  Laterality: N/A;  . CYSTOSCOPY/URETEROSCOPY/HOLMIUM LASER/STENT PLACEMENT Right 02/27/2018   Procedure: CYSTOSCOPY/URETEROSCOPY/HOLMIUM LASER/STENT PLACEMENT;  Surgeon: Riki AltesStoioff, Marilyn Wing C, MD;  Location: [facility];  Service: Urology;  Laterality: Right;  . OTHER SURGICAL HISTORY     mole bx   . WISDOM TOOTH EXTRACTION      Home Medications:  Allergies as of 03/27/2018      Reactions   Codeine  Nausea Only, Nausea And Vomiting   Latex Rash      Medication List        Accurate as of 03/27/18 10:59 AM. Always use your most recent med list.          buPROPion 150 MG 24 hr tablet Commonly known as:  WELLBUTRIN XL Take 1 tablet (150 mg total) by mouth at bedtime.   norgestimate-ethinyl estradiol 0.25-35 MG-MCG tablet Commonly known as:  ORTHO-CYCLEN,SPRINTEC,PREVIFEM Take 1 tablet by mouth daily.       Allergies:  Allergies  Allergen Reactions  . Codeine Nausea Only and Nausea And Vomiting  . Latex Rash    Family History: Family History  Problem Relation Age of Onset  . Hypertension Mother   . Depression Mother   . Hyperlipidemia Mother   . Diverticulosis Mother   . Hearing loss Father   . Hypertension Father   . Irritable bowel syndrome Father   . Hyperlipidemia Father   . Breast cancer Maternal Grandmother   . Miscarriages / Stillbirths Maternal Grandmother   . Cancer Maternal Grandmother        breast   . Diabetes Maternal Grandfather   . Heart disease Maternal Grandfather        MI  . Arthritis Paternal Grandfather   . Irritable bowel syndrome Paternal Aunt   . GER disease Paternal Aunt   . Crohn's disease Paternal Aunt   . Diabetes Other     Social History:  reports that she has  never smoked. She has never used smokeless tobacco. She reports that she drinks alcohol. She reports that she does not use drugs.  ROS: UROLOGY Frequent Urination?: No Hard to postpone urination?: No Burning/pain with urination?: No Get up at night to urinate?: No Leakage of urine?: No Urine stream starts and stops?: No Trouble starting stream?: No Do you have to strain to urinate?: No Blood in urine?: No Urinary tract infection?: No Sexually transmitted disease?: No Injury to kidneys or bladder?: No Painful intercourse?: No Weak stream?: No Currently pregnant?: No Vaginal bleeding?: No Last menstrual period?: n  Gastrointestinal Nausea?: No Vomiting?:  No Indigestion/heartburn?: No Diarrhea?: No Constipation?: No  Constitutional Fever: No Night sweats?: No Weight loss?: Yes Fatigue?: No  Skin Skin rash/lesions?: No Itching?: No  Eyes Blurred vision?: No Double vision?: No  Ears/Nose/Throat Sore throat?: No Sinus problems?: Yes  Hematologic/Lymphatic Swollen glands?: Yes Easy bruising?: No  Cardiovascular Leg swelling?: No Chest pain?: No  Respiratory Cough?: No Shortness of breath?: No  Endocrine Excessive thirst?: No  Musculoskeletal Back pain?: No Joint pain?: No  Neurological Headaches?: No Dizziness?: No  Psychologic Depression?: No Anxiety?: Yes  Physical Exam: BP (!) 148/85   Pulse 60   Resp 16   Ht 5' (1.524 m)   Wt 143 lb 9.6 oz (65.1 kg)   SpO2 98%   BMI 28.04 kg/m    Constitutional:  Alert and oriented, No acute distress. HEENT: [place] AT, moist mucus membranes.  Trachea midline Cardiovascular: No clubbing, cyanosis, or edema. Respiratory: Normal respiratory effort, no increased work of breathing.   Laboratory Data: Lab Results  Component Value Date   WBC 6.7 01/02/2018   HGB 15.9 (H) 01/02/2018   HCT 47.7 (H) 01/02/2018   MCV 94.8 01/02/2018   PLT 394.0 01/02/2018    Lab Results  Component Value Date   CREATININE 0.67 01/02/2018    Assessment & Plan:   Doing well status post ureteroscopic stone removal.  She has a recurrent stone former and have recommended proceeding with a metabolic evaluation through [facility].  Riki Altes, MD  [facility] [address] [place], [place] [address] [phone]

## 2018-04-03 ENCOUNTER — Ambulatory Visit: Payer: BLUE CROSS/BLUE SHIELD | Admitting: Gastroenterology

## 2018-04-03 ENCOUNTER — Ambulatory Visit: Payer: BLUE CROSS/BLUE SHIELD | Admitting: Urology

## 2018-04-13 ENCOUNTER — Ambulatory Visit: Payer: BLUE CROSS/BLUE SHIELD | Admitting: Gastroenterology

## 2018-04-22 ENCOUNTER — Other Ambulatory Visit: Payer: Self-pay | Admitting: Internal Medicine

## 2018-04-22 DIAGNOSIS — F329 Major depressive disorder, single episode, unspecified: Secondary | ICD-10-CM

## 2018-04-22 DIAGNOSIS — F32A Depression, unspecified: Secondary | ICD-10-CM

## 2018-04-22 DIAGNOSIS — F419 Anxiety disorder, unspecified: Principal | ICD-10-CM

## 2018-04-22 MED ORDER — SERTRALINE HCL 50 MG PO TABS: 50.0000 mg | ORAL_TABLET | Freq: Every day | ORAL | 5 refills | Status: DC

## 2018-05-11 ENCOUNTER — Emergency Department: Payer: BLUE CROSS/BLUE SHIELD

## 2018-05-11 ENCOUNTER — Emergency Department
Admission: EM | Admit: 2018-05-11 | Discharge: 2018-05-11 | Disposition: A | Discharge status: Home / Self Care | Payer: BLUE CROSS/BLUE SHIELD | Attending: Emergency Medicine | Admitting: Emergency Medicine

## 2018-05-11 ENCOUNTER — Ambulatory Visit: Payer: Self-pay | Admitting: *Deleted

## 2018-05-11 ENCOUNTER — Encounter: Payer: Self-pay | Admitting: Emergency Medicine

## 2018-05-11 ENCOUNTER — Other Ambulatory Visit: Payer: Self-pay

## 2018-05-11 DIAGNOSIS — K8050 Calculus of bile duct without cholangitis or cholecystitis without obstruction: Secondary | ICD-10-CM

## 2018-05-11 DIAGNOSIS — J45909 Unspecified asthma, uncomplicated: Secondary | ICD-10-CM | POA: Insufficient documentation

## 2018-05-11 DIAGNOSIS — I10 Essential (primary) hypertension: Secondary | ICD-10-CM | POA: Diagnosis not present

## 2018-05-11 DIAGNOSIS — R1011 Right upper quadrant pain: Secondary | ICD-10-CM | POA: Insufficient documentation

## 2018-05-11 DIAGNOSIS — R079 Chest pain, unspecified: Secondary | ICD-10-CM | POA: Diagnosis present

## 2018-05-11 LAB — COMPREHENSIVE METABOLIC PANEL
ALK PHOS: 79 U/L (ref 38–126)
ALT: 13 U/L — ABNORMAL LOW (ref 14–54)
ANION GAP: 8 (ref 5–15)
AST: 17 U/L (ref 15–41)
Albumin: 4.1 g/dL (ref 3.5–5.0)
BILIRUBIN TOTAL: 1.3 mg/dL — AB (ref 0.3–1.2)
BUN: 12 mg/dL (ref 6–20)
CO2: 25 mmol/L (ref 22–32)
Calcium: 8.5 mg/dL — ABNORMAL LOW (ref 8.9–10.3)
Chloride: 101 mmol/L (ref 101–111)
Creatinine, Ser: 0.57 mg/dL (ref 0.44–1.00)
GFR calc non Af Amer: >60 mL/min (ref >60)
Glucose, Bld: 90 mg/dL (ref 65–99)
POTASSIUM: 3.4 mmol/L — AB (ref 3.5–5.1)
Sodium: 134 mmol/L — ABNORMAL LOW (ref 135–145)
TOTAL PROTEIN: 7.3 g/dL (ref 6.5–8.1)

## 2018-05-11 LAB — URINALYSIS, COMPLETE (UACMP) WITH MICROSCOPIC
BACTERIA UA: NONE SEEN
Bilirubin Urine: NEGATIVE
GLUCOSE, UA: NEGATIVE mg/dL
Hgb urine dipstick: NEGATIVE
KETONES UR: NEGATIVE mg/dL
Leukocytes, UA: NEGATIVE
NITRITE: NEGATIVE
PH: 6 (ref 5.0–8.0)
PROTEIN: NEGATIVE mg/dL
Specific Gravity, Urine: 1.028 (ref 1.005–1.030)

## 2018-05-11 LAB — TROPONIN I: Troponin I: <0.03 ng/mL (ref <0.03)

## 2018-05-11 LAB — CBC
HEMATOCRIT: 40.5 % (ref 35.0–47.0)
HEMOGLOBIN: 14 g/dL (ref 12.0–16.0)
MCH: 31.3 pg (ref 26.0–34.0)
MCHC: 34.6 g/dL (ref 32.0–36.0)
MCV: 90.6 fL (ref 80.0–100.0)
Platelets: 316 10*3/uL (ref 150–440)
RBC: 4.47 MIL/uL (ref 3.80–5.20)
RDW: 13.1 % (ref 11.5–14.5)
WBC: 8.4 10*3/uL (ref 3.6–11.0)

## 2018-05-11 LAB — POCT PREGNANCY, URINE: Preg Test, Ur: NEGATIVE

## 2018-05-11 LAB — LIPASE, BLOOD: Lipase: 25 U/L (ref 11–51)

## 2018-05-11 MED ORDER — MORPHINE SULFATE (PF) 2 MG/ML IV SOLN: 2.0000 mg | Freq: Once | INTRAVENOUS | Status: DC

## 2018-05-11 MED ORDER — PROMETHAZINE HCL 25 MG/ML IJ SOLN
25.0000 mg | Freq: Once | INTRAMUSCULAR | Status: AC
  Administered 2018-05-11: 25 mg via INTRAVENOUS
  Filled 2018-05-11: qty 1

## 2018-05-11 MED ORDER — MORPHINE SULFATE (PF) 4 MG/ML IV SOLN
4.0000 mg | Freq: Once | INTRAVENOUS | Status: DC
  Filled 2018-05-11: qty 1

## 2018-05-11 MED ORDER — PROMETHAZINE HCL 25 MG PO TABS: 25.0000 mg | ORAL_TABLET | Freq: Four times a day (QID) | ORAL | 0 refills | Status: DC | PRN

## 2018-05-11 MED ORDER — ONDANSETRON HCL 4 MG/2ML IJ SOLN
4.0000 mg | Freq: Once | INTRAMUSCULAR | Status: AC
  Administered 2018-05-11: 4 mg via INTRAVENOUS
  Filled 2018-05-11: qty 2

## 2018-05-11 MED ORDER — SODIUM CHLORIDE 0.9 % IV SOLN
Freq: Once | INTRAVENOUS | Status: AC
  Administered 2018-05-11: 10:00:00 via INTRAVENOUS

## 2018-05-11 MED ORDER — MORPHINE SULFATE (PF) 2 MG/ML IV SOLN
2.0000 mg | Freq: Once | INTRAVENOUS | Status: AC
  Administered 2018-05-11: 2 mg via INTRAVENOUS

## 2018-05-11 MED ORDER — OXYCODONE-ACETAMINOPHEN 5-325 MG PO TABS: 1.0000 | ORAL_TABLET | Freq: Three times a day (TID) | ORAL | 0 refills | Status: DC | PRN

## 2018-05-11 MED ORDER — ONDANSETRON 4 MG PO TBDP: 4.0000 mg | ORAL_TABLET | Freq: Three times a day (TID) | ORAL | 0 refills | Status: DC | PRN

## 2018-05-11 NOTE — ED Provider Notes (Signed)
[facility] Emergency Department Provider Note       Time seen: ----------------------------------------- 10:12 AM on 05/11/2018 -----------------------------------------   I have reviewed the triage vital signs and the nursing notes.  HISTORY   Chief Complaint Chest Pain    HPI Megan Kramer is a 27 y.o. female with a history of anxiety, asthma, dysrhythmia, renal colic, hypertension, IBS and obesity who presents to the ED for chest and upper abdominal pain for the past month with associated nausea.  Patient reports everyone in her family has had to have their gallbladder removed, she does notice it is worse after she eats.  She denies fevers, chills or other complaints.  Past Medical History:  Diagnosis Date  . Anxiety   . Asthma    EXERCISE INDUCED-NO INHALER NOW  . Dysrhythmia    TACHYCARDIA WITH 1ST PREGNANCY-ECHO DONE AND WAS WNL-ASYMPTOMATIC NOW  . Family history of adverse reaction to anesthesia    MOM- N/V  . Heart murmur    AS A CHILD-ASYMPTOMATIC  . Herpes   . History of kidney stones   . Hypertension    pregnancy induced  . IBS (irritable bowel syndrome)   . Obesity (BMI 30-39.9)   . Postpartum depression associated with first pregnancy   . Second pregnancy     Patient Active Problem List   Diagnosis Date Noted  . IBS (irritable bowel syndrome) 01/26/2018  . HLD (hyperlipidemia) 01/26/2018  . Kidney stones 05/09/2017  . Flank pain   . Previous cesarean section complicating pregnancy, antepartum condition or complication 03/14/2017  . Herpes genitalis 03/14/2017  . Anxiety 03/14/2017  . Post partum depression 03/14/2017    Past Surgical History:  Procedure Laterality Date  . CESAREAN SECTION  12/21/2014   Teresa Coombs, [place]  . CESAREAN SECTION N/A 05/27/2017   Procedure: CESAREAN SECTION;  Surgeon: Nadara Mustard, MD;  Location: [facility];  Service: Obstetrics;  Laterality: N/A;  . CYSTOSCOPY/URETEROSCOPY/HOLMIUM  LASER/STENT PLACEMENT Right 02/27/2018   Procedure: CYSTOSCOPY/URETEROSCOPY/HOLMIUM LASER/STENT PLACEMENT;  Surgeon: Riki Altes, MD;  Location: [facility];  Service: Urology;  Laterality: Right;  . OTHER SURGICAL HISTORY     mole bx   . WISDOM TOOTH EXTRACTION      Allergies Codeine and Latex  Social History Social History   Tobacco Use  . Smoking status: Never Smoker  . Smokeless tobacco: Never Used  Substance Use Topics  . Alcohol use: Yes    Comment: occasion  . Drug use: No   Review of Systems Constitutional: Negative for fever. Cardiovascular: Negative for chest pain. Respiratory: Negative for shortness of breath. Gastrointestinal: Positive for abdominal pain, nausea and vomiting Genitourinary: Negative for dysuria. Musculoskeletal: Positive for back pain Skin: Negative for rash. Neurological: Negative for headaches, focal weakness or numbness.  All systems negative/normal/unremarkable except as stated in the HPI  ____________________________________________   PHYSICAL EXAM:  VITAL SIGNS: ED Triage Vitals [05/11/18 0953]  Enc Vitals Group     BP 132/80     Pulse Rate 71     Resp 14     Temp 98.3 F (36.8 C)     Temp Source Oral     SpO2 100 %     Weight 140 lb (63.5 kg)     Height 5' (1.524 m)     Head Circumference      Peak Flow      Pain Score 5     Pain Loc      Pain Edu?  Excl. in GC?    Constitutional: Alert and oriented. Well appearing and in no distress. Eyes: Conjunctivae are normal. Normal extraocular movements. ENT   Head: Normocephalic and atraumatic.   Nose: No congestion/rhinnorhea.   Mouth/Throat: Mucous membranes are moist.   Neck: No stridor. Cardiovascular: Normal rate, regular rhythm. No murmurs, rubs, or gallops. Respiratory: Normal respiratory effort without tachypnea nor retractions. Breath sounds are clear and equal bilaterally. No wheezes/rales/rhonchi. Gastrointestinal: Soft and nontender. Normal bowel  sounds Musculoskeletal: Nontender with normal range of motion in extremities. No lower extremity tenderness nor edema. Neurologic:  Normal speech and language. No gross focal neurologic deficits are appreciated.  Skin:  Skin is warm, dry and intact. No rash noted. Psychiatric: Mood and affect are normal. Speech and behavior are normal.  ____________________________________________  EKG: Interpreted by me.  Sinus rhythm with sinus arrhythmia, rate of 70 bpm, normal PR interval, normal QRS, normal QT.  ____________________________________________  ED COURSE:  As part of my medical decision making, I reviewed the following data within the electronic MEDICAL RECORD NUMBER History obtained from family if available, nursing notes, old chart and ekg, as well as notes from prior ED visits. Patient presented for abdominal pain and vomiting, we will assess with labs and imaging as indicated at this time.   Procedures ____________________________________________   LABS (pertinent positives/negatives)  Labs Reviewed  COMPREHENSIVE METABOLIC PANEL - Abnormal; Notable for the following components:      Result Value   Sodium 134 (*)    Potassium 3.4 (*)    Calcium 8.5 (*)    ALT 13 (*)    Total Bilirubin 1.3 (*)    All other components within normal limits  URINALYSIS, COMPLETE (UACMP) WITH MICROSCOPIC - Abnormal; Notable for the following components:   Color, Urine YELLOW (*)    APPearance CLEAR (*)    All other components within normal limits  LIPASE, BLOOD  CBC  TROPONIN I  POCT PREGNANCY, URINE  POC URINE PREG, ED    RADIOLOGY  Right upper quadrant ultrasound IMPRESSION: Mild cholelithiasis, without associated sonographic findings to suggest acute cholecystitis.  ____________________________________________  DIFFERENTIAL DIAGNOSIS   Cholecystitis, cholelithiasis, peptic ulcer disease, GERD, musculoskeletal pain  FINAL ASSESSMENT AND PLAN  Biliary colic   Plan: The patient  had presented for right upper quadrant pain. Patient's labs are grossly unremarkable. Patient's imaging did reveal gallstones as expected.  She is likely having biliary colic, she will be discharged with pain medicine and antiemetics and will be referred to general surgery for outpatient follow-up, family has requested to follow-up with Dr. Birdie Sons.   Ulice Dash, MD   Note: This note was generated in part or whole with voice recognition software. Voice recognition is usually quite accurate but there are transcription errors that can and very often do occur. I apologize for any typographical errors that were not detected and corrected.     Emily Filbert, MD 05/11/18 1224

## 2018-05-11 NOTE — Telephone Encounter (Signed)
Called in c/o pain in her right shoulder blade and also under her right breast that radiates into right shoulder blade that started a couple of weeks ago after eating breakfast.   I've been very nauseated and I got up twice last night and vomited a total of 2 times.    "I think it's my gallbladder because I'm the only one left in my family that still has it".   "They all had these same symptoms"     She has decided to go to the ED so they can do an ultrasound or CT scan to see if that's what it is.   All the providers at [facility] are booked today.   I made her an appt with Delbert Harness, PA for 05/12/18 at 9:45 at her request.  "Just in case I don't go to the ED but I'm sure I'm going to go on so they can do an ultrasound or CT scan that can't be done in the office".    I let her know to please cancel the appt if she decides to go to the ED then again they may want her to follow up.   See what the ED doctor suggests. She verbalized understanding.    Reason for Disposition . Patient sounds very sick or weak to the triager    Pt thinks it's gallbladder pain.  It runs in her family and they had these same symptoms.  Answer Assessment - Initial Assessment Questions 1. ONSET: "When did the pain start?"     Right shoulder for a couple of weeks.   It's under my right breast bone with pain shooting to my shoulder.   I'm very nauseated and during the night I vomited twice. 2. LOCATION: "Where is the pain located?"     Right shoulder and right breast area.    3. PAIN: "How bad is the pain?" (Scale 1-10; or mild, moderate, severe)   - MILD (1-3): doesn't interfere with normal activities   - MODERATE (4-7): interferes with normal activities (e.g., work or school) or awakens from sleep   - SEVERE (8-10): excruciating pain, unable to do any normal activities, unable to move arm at all due to pain     It comes and goes the pain in my ribs.   The shoulder pain is constant. 4. WORK OR EXERCISE: "Has  there been any recent work or exercise that involved this part of the body?"     No.    A couple a weeks ago I ate breakfast and it started under my right breast and then into my shoulder blade. 5. CAUSE: "What do you think is causing the shoulder pain?"     I think it's my gallbladder.    Everyone in my family has had their gallbladder removed and had these same symptoms. 6. OTHER SYMPTOMS: "Do you have any other symptoms?" (e.g., neck pain, swelling, rash, fever, numbness, weakness)     No diarrhea.  99.2 fever last night.  This morning 99.2 this morning. 7. PREGNANCY: "Is there any chance you are pregnant?" "When was your last menstrual period?"     No I'm on birth control.  Protocols used: SHOULDER PAIN-A-AH

## 2018-05-11 NOTE — ED Triage Notes (Signed)
R chest and upper abd pain x 1 month. Nauseated.

## 2018-05-11 NOTE — Telephone Encounter (Signed)
FYI

## 2018-05-12 ENCOUNTER — Ambulatory Visit: Payer: BLUE CROSS/BLUE SHIELD | Admitting: Family Medicine

## 2018-05-12 ENCOUNTER — Encounter: Payer: Self-pay | Admitting: General Surgery

## 2018-05-12 ENCOUNTER — Ambulatory Visit: Payer: BLUE CROSS/BLUE SHIELD | Admitting: General Surgery

## 2018-05-12 VITALS — BP 130/88 | HR 71 | Resp 14 | Ht 60.0 in | Wt 142.0 lb

## 2018-05-12 DIAGNOSIS — K8020 Calculus of gallbladder without cholecystitis without obstruction: Secondary | ICD-10-CM

## 2018-05-12 DIAGNOSIS — Z0289 Encounter for other administrative examinations: Secondary | ICD-10-CM

## 2018-05-12 DIAGNOSIS — K811 Chronic cholecystitis: Secondary | ICD-10-CM | POA: Insufficient documentation

## 2018-05-12 NOTE — Progress Notes (Signed)
Patient ID: Megan Kramer, female   DOB: 1991-08-17, 26 y.o.   MRN: [id number]  Chief Complaint  Patient presents with  . Abdominal Pain    HPI Megan Kramer is a 27 y.o. female. Here for evaluation of right abdominal pain and ED visit on 05-11-18. She states the pain is substernal and radiating to her back. She admits to chronic nausea. She noticed this about a month ago with the substernal pain occurring with meals. She states it has progressively gotten worse.  Vomiting was Sunday night 05-10-18 into Monday. She has had right rib pain described as "cramping. Ultrasound was 05-11-18. Bowels are regular and she admits to IBS so at times they can be either diarrhea or constipation. She states prior to a month ago she was doing well especially since she had the kidney stone removed. She is here with her husband, Gabriel Rung and her Aunt June. Her husband feels that she has had issues on and off for about 2 years.  She works as a Sales executive for [employer].  HPI  Past Medical History:  Diagnosis Date  . Anxiety   . Asthma    EXERCISE INDUCED-NO INHALER NOW  . Dysrhythmia    TACHYCARDIA WITH 1ST PREGNANCY-ECHO DONE AND WAS WNL-ASYMPTOMATIC NOW  . Family history of adverse reaction to anesthesia    MOM- N/V  . Heart murmur    AS A CHILD-ASYMPTOMATIC  . Herpes   . History of kidney stones   . Hypertension    pregnancy induced  . IBS (irritable bowel syndrome)   . Obesity (BMI 30-39.9)   . Postpartum depression associated with first pregnancy   . Second pregnancy     Past Surgical History:  Procedure Laterality Date  . CESAREAN SECTION  12/21/2014   Teresa Coombs, [place]  . CESAREAN SECTION N/A 05/27/2017   Procedure: CESAREAN SECTION;  Surgeon: Nadara Mustard, MD;  Location: [facility] OR[facility];  Service: Obstetrics;  Laterality: N/A;  . CYSTOSCOPY/URETEROSCOPY/HOLMIUM LASER/STENT PLACEMENT Right 02/27/2018   Procedure: CYSTOSCOPY/URETEROSCOPY/HOLMIUM LASER/STENT PLACEMENT;  Surgeon: Riki Altes, MD;  Location: [facility] OR[facility];  Service: Urology;  Laterality: Right;  . OTHER SURGICAL HISTORY     mole bx   . WISDOM TOOTH EXTRACTION      Family History  Problem Relation Age of Onset  . Hypertension Mother   . Depression Mother   . Hyperlipidemia Mother   . Diverticulosis Mother   . Hearing loss Father   . Hypertension Father   . Irritable bowel syndrome Father   . Hyperlipidemia Father   . Breast cancer Maternal Grandmother   . Miscarriages / Stillbirths Maternal Grandmother   . Cancer Maternal Grandmother        breast   . Diabetes Maternal Grandfather   . Heart disease Maternal Grandfather        MI  . Arthritis Paternal Grandfather   . Irritable bowel syndrome Paternal Aunt   . GER disease Paternal Aunt   . Crohn's disease Paternal Aunt   . Diabetes Other     Social History Social History   Tobacco Use  . Smoking status: Never Smoker  . Smokeless tobacco: Never Used  Substance Use Topics  . Alcohol use: Yes    Comment: occasion  . Drug use: No    Allergies  Allergen Reactions  . Codeine Nausea Only and Nausea And Vomiting  . Latex Rash    Current Outpatient Medications  Medication Sig Dispense Refill  . buPROPion (WELLBUTRIN XL)  150 MG 24 hr tablet Take 1 tablet (150 mg total) by mouth at bedtime. 30 tablet 5  . norgestimate-ethinyl estradiol (ORTHO-CYCLEN,SPRINTEC,PREVIFEM) 0.25-35 MG-MCG tablet Take 1 tablet by mouth daily. 1 Package 11  . ondansetron (ZOFRAN ODT) 4 MG disintegrating tablet Take 1 tablet (4 mg total) by mouth every 8 (eight) hours as needed for nausea or vomiting. 20 tablet 0  . promethazine (PHENERGAN) 25 MG tablet Take 1 tablet (25 mg total) by mouth every 6 (six) hours as needed for nausea or vomiting. 20 tablet 0  . sertraline (ZOLOFT) 50 MG tablet Take 1 tablet (50 mg total) by mouth daily. 30 tablet 5  . oxyCODONE-acetaminophen (PERCOCET) 5-325 MG tablet Take 1-2 tablets by mouth every 8 (eight) hours as needed. (Patient not taking:  Reported on 05/12/2018) 20 tablet 0   No current facility-administered medications for this visit.     Review of Systems Review of Systems  Constitutional: Negative.   Respiratory: Negative.   Cardiovascular: Negative.   Gastrointestinal: Positive for abdominal pain, nausea and vomiting.    Blood pressure 130/88, pulse 71, resp. rate 14, height 5' (1.524 m), weight 142 lb (64.4 kg), last menstrual period 04/12/2018, SpO2 98 %.  Physical Exam Physical Exam  Constitutional: She is oriented to person, place, and time. She appears well-developed and well-nourished.  HENT:  Mouth/Throat: Oropharynx is clear and moist. No oropharyngeal exudate.  Eyes: No scleral icterus.  Cardiovascular: Normal rate and regular rhythm.  No lower leg edema  Pulmonary/Chest: Effort normal and breath sounds normal.  Abdominal: Soft. Normal appearance. There is no tenderness.  Neurological: She is alert and oriented to person, place, and time.  Skin: Skin is warm and dry.  Psychiatric: Her behavior is normal.    Data Reviewed CT scan of the abdomen dated January 16, 2018 reviewed.  Obstructing distal right ureteral stone with hydronephrosis.  Abdominal ultrasound of May 11, 2018 reviewed.  4 mm gallstones without evidence of acute cholecystitis.  Laboratory studies obtained through the emergency department yesterday showed normal CBC.  Elevated total bilirubin without direct component measured.  No elevation of the serum transaminases or alkaline phosphatase.  Normal lipase.  Ureteroscopy and stone extraction completed on February 27, 2018.  Delay secondary to lack of medical insurance.  Assessment    Gallstones, likely symptomatic cholelithiasis.  Strong family history of biliary colic.      Plan    Laparoscopic Cholecystectomy with Intraoperative Cholangiogram. The procedure, including it's potential risks and complications (including but not limited to infection, bleeding, injury to  intra-abdominal organs or bile ducts, bile leak, poor cosmetic result, sepsis and death) were discussed with the patient in detail. Non-operative options, including their inherent risks (acute calculous cholecystitis with possible choledocholithiasis or gallstone pancreatitis, with the risk of ascending cholangitis, sepsis, and death) were discussed as well. The patient expressed and understanding of what we discussed and wishes to proceed with laparoscopic cholecystectomy. The patient further understands that if it is technically not possible, or it is unsafe to proceed laparoscopically, that I will convert to an open cholecystectomy.  The patient has been discouraged from lifting her soon-to-be 31-year-old child for the first 2 weeks after surgery.    HPI, Physical Exam, Assessment and Plan have been scribed under the direction and in the presence of Earline Mayotte, MD. Dorathy Daft, RN  I have completed the exam and reviewed the above documentation for accuracy and completeness.  I agree with the above.  Museum/gallery conservator has been used  and any errors in dictation or transcription are unintentional.  Donnalee Curry, M.D., F.A.C.S.  The patient is scheduled for surgery at [facility] on 05/20/18. She will pre admit by phone. She is aware of date and instructions.  Documented by Sinda Du LPN  Merrily Pew Jerid Catherman 05/12/2018, 7:22 PM

## 2018-05-12 NOTE — Patient Instructions (Addendum)
The patient is aware to call back for any questions or concerns.  Laparoscopic Cholecystectomy Laparoscopic cholecystectomy is surgery to remove the gallbladder. The gallbladder is a pear-shaped organ that lies beneath the liver on the right side of the body. The gallbladder stores bile, which is a fluid that helps the body to digest fats. Cholecystectomy is often done for inflammation of the gallbladder (cholecystitis). This condition is usually caused by a buildup of gallstones (cholelithiasis) in the gallbladder. Gallstones can block the flow of bile, which can result in inflammation and pain. In severe cases, emergency surgery may be required. This procedure is done though small incisions in your abdomen (laparoscopic surgery). A thin scope with a camera (laparoscope) is inserted through one incision. Thin surgical instruments are inserted through the other incisions. In some cases, a laparoscopic procedure may be turned into a type of surgery that is done through a larger incision (open surgery). Tell a health care provider about:  Any allergies you have.  All medicines you are taking, including vitamins, herbs, eye drops, creams, and over-the-counter medicines.  Any problems you or family members have had with anesthetic medicines.  Any blood disorders you have.  Any surgeries you have had.  Any medical conditions you have.  Whether you are pregnant or may be pregnant. What are the risks? Generally, this is a safe procedure. However, problems may occur, including:  Infection.  Bleeding.  Allergic reactions to medicines.  Damage to other structures or organs.  A stone remaining in the common bile duct. The common bile duct carries bile from the gallbladder into the small intestine.  A bile leak from the cyst duct that is clipped when your gallbladder is removed.  What happens before the procedure? Staying hydrated Follow instructions from your health care provider about  hydration, which may include:  Up to 2 hours before the procedure - you may continue to drink clear liquids, such as water, clear fruit juice, black coffee, and plain tea.  Eating and drinking restrictions Follow instructions from your health care provider about eating and drinking, which may include:  8 hours before the procedure - stop eating heavy meals or foods such as meat, fried foods, or fatty foods.  6 hours before the procedure - stop eating light meals or foods, such as toast or cereal.  6 hours before the procedure - stop drinking milk or drinks that contain milk.  2 hours before the procedure - stop drinking clear liquids.  Medicines  Ask your health care provider about: ? Changing or stopping your regular medicines. This is especially important if you are taking diabetes medicines or blood thinners. ? Taking medicines such as aspirin and ibuprofen. These medicines can thin your blood. Do not take these medicines before your procedure if your health care provider instructs you not to.  You may be given antibiotic medicine to help prevent infection. General instructions  Let your health care provider know if you develop a cold or an infection before surgery.  Plan to have someone take you home from the hospital or clinic.  Ask your health care provider how your surgical site will be marked or identified. What happens during the procedure?  To reduce your risk of infection: ? Your health care team will wash or sanitize their hands. ? Your skin will be washed with soap. ? Hair may be removed from the surgical area.  An IV tube may be inserted into one of your veins.  You will be given one  or more of the following: ? A medicine to help you relax (sedative). ? A medicine to make you fall asleep (general anesthetic).  A breathing tube will be placed in your mouth.  Your surgeon will make several small cuts (incisions) in your abdomen.  The laparoscope will be  inserted through one of the small incisions. The camera on the laparoscope will send images to a TV screen (monitor) in the operating room. This lets your surgeon see inside your abdomen.  Air-like gas will be pumped into your abdomen. This will expand your abdomen to give the surgeon more room to perform the surgery.  Other tools that are needed for the procedure will be inserted through the other incisions. The gallbladder will be removed through one of the incisions.  Your common bile duct may be examined. If stones are found in the common bile duct, they may be removed.  After your gallbladder has been removed, the incisions will be closed with stitches (sutures), staples, or skin glue.  Your incisions may be covered with a bandage (dressing). The procedure may vary among health care providers and hospitals. What happens after the procedure?  Your blood pressure, heart rate, breathing rate, and blood oxygen level will be monitored until the medicines you were given have worn off.  You will be given medicines as needed to control your pain.  Do not drive for 24 hours if you were given a sedative. This information is not intended to replace advice given to you by your health care provider. Make sure you discuss any questions you have with your health care provider. Document Released: 12/09/2005 Document Revised: 06/30/2016 Document Reviewed: 05/27/2016 Elsevier Interactive Patient Education  [employer].  The patient is scheduled for surgery at [facility] on 05/20/18. She will pre admit by phone. She is aware of date and instructions.

## 2018-05-13 ENCOUNTER — Other Ambulatory Visit: Payer: Self-pay

## 2018-05-13 ENCOUNTER — Encounter
Admission: RE | Admit: 2018-05-13 | Discharge: 2018-05-13 | Disposition: A | Discharge status: Home / Self Care | Payer: BLUE CROSS/BLUE SHIELD | Source: Ambulatory Visit | Attending: General Surgery | Admitting: General Surgery

## 2018-05-13 ENCOUNTER — Telehealth: Payer: Self-pay | Admitting: *Deleted

## 2018-05-13 HISTORY — DX: Gastro-esophageal reflux disease without esophagitis: K21.9

## 2018-05-13 NOTE — Pre-Procedure Instructions (Signed)
EKG: Interpreted by me.  Sinus rhythm with sinus arrhythmia, rate of 70 bpm, normal PR interval, normal QRS, normal QT.  ____________________________________________  ED COURSE:  As part of my medical decision making, I reviewed the following data within the electronic MEDICAL RECORD NUMBERHistory obtained from family if available, nursing notes,old chart and ekg, as well as notes from prior ED visits. Patient presented for abdominal pain and vomiting, we will assess with labs and imaging as indicated at this time. Procedures ____________________________________________   LABS (pertinent positives/negatives)       Labs Reviewed  COMPREHENSIVE METABOLIC PANEL - Abnormal; Notable for the following components:      Result Value    Sodium 134 (*)    Potassium 3.4 (*)    Calcium 8.5 (*)    ALT 13 (*)    Total Bilirubin 1.3 (*)    All other components within normal limits  URINALYSIS, COMPLETE (UACMP) WITH MICROSCOPIC - Abnormal; Notable for the following components:   Color, Urine YELLOW (*)    APPearance CLEAR (*)    All other components within normal limits  LIPASE, BLOOD  CBC  TROPONIN I  POCT PREGNANCY, URINE  POC URINE PREG, ED    RADIOLOGY  Right upper quadrant ultrasound IMPRESSION: Mild cholelithiasis, without associated sonographic findings to suggest acute cholecystitis.  ____________________________________________  DIFFERENTIAL DIAGNOSIS   Cholecystitis, cholelithiasis, peptic ulcer disease, GERD, musculoskeletal pain  FINAL ASSESSMENT AND PLAN  Biliary colic   Plan: The patient had presented for right upper quadrant pain. Patient's labs are grossly unremarkable. Patient's imaging did reveal gallstones as expected.  She is likely having biliary colic, she will be discharged with pain medicine and antiemetics and will be referred to general surgery for outpatient follow-up, family has requested to follow-up with Dr.  Birdie Sons.   Ulice Dash, MD   Note: This note was generated in part or whole with voice recognition software. Voice recognition is usually quite accurate but there are transcription errors that can and very often do occur. I apologize for any typographical errors that were not detected and corrected.     Emily Filbert, MD 05/11/18 1224           Electronically signed by Emily Filbert, MD at 05/11/2018 12:24 PM     ED on 05/11/2018        Detailed Report

## 2018-05-13 NOTE — Telephone Encounter (Signed)
Patient called the office this morning wanting to move surgery date up. The patient states she is still hurting and does not want to wait until next week in pain. The patient also states she will have the most help at home if she could have surgery done this Friday instead of next week.   Surgery has been moved from Wednesday, 05-20-18 to Friday, 05-15-18 at [facility].   [facility] has been notified of date change.

## 2018-05-13 NOTE — Patient Instructions (Signed)
Your procedure is scheduled on: 05-15-18 Report to Same Day Surgery 2nd floor medical mall [facility] Entrance-take elevator on left to 2nd floor.  Check in with surgery information desk.) To find out your arrival time please call [phone] between 1PM - 3PM on 05-14-18  Remember: Instructions that are not followed completely may result in serious medical risk, up to and including death, or upon the discretion of your surgeon and anesthesiologist your surgery may need to be rescheduled.    _x___ 1. Do not eat food after midnight the night before your procedure. NO GUM OR CANDY AFTER MIDNIGHT.  You may drink clear liquids up to 2 hours before you are scheduled to arrive at the hospital for your procedure.  Do not drink clear liquids within 2 hours of your scheduled arrival to the hospital.  Clear liquids include  --Water or Apple juice without pulp  --Clear carbohydrate beverage such as ClearFast or Gatorade  --Black Coffee or Clear Tea (No milk, no creamers, do not add anything to the coffee or Tea    __x__ 2. No Alcohol for 24 hours before or after surgery.   __x__3. No Smoking or e-cigarettes for 24 prior to surgery.  Do not use any chewable tobacco products for at least 6 hour prior to surgery   ____  4. Bring all medications with you on the day of surgery if instructed.    __x__ 5. Notify your doctor if there is any change in your medical condition     (cold, fever, infections).    x___6. On the morning of surgery brush your teeth with toothpaste and water.  You may rinse your mouth with mouth wash if you wish.  Do not swallow any toothpaste or mouthwash.   Do not wear jewelry, make-up, hairpins, clips or nail polish.  Do not wear lotions, powders, or perfumes. You may wear deodorant.  Do not shave 48 hours prior to surgery. Men may shave face and neck.  Do not bring valuables to the hospital.    [facility] is not responsible for any belongings or valuables.    Contacts, dentures or bridgework may not be worn into surgery.  Leave your suitcase in the car. After surgery it may be brought to your room.  For patients admitted to the hospital, discharge time is determined by your treatment team.  _  Patients discharged the day of surgery will not be allowed to drive home.  You will need someone to drive you home and stay with you the night of your procedure.     _x___ Take anti-hypertensive listed below, cardiac, seizure, asthma,     anti-reflux and psychiatric medicines. These include:  1. YOU MAY TAKE YOUR PERCOCET/ZOFRAN DAY OF SURGERY IF NEEDED WITH A SMALL SIP OF WATER  2.  3.  4.  5.  6.  ____Fleets enema or Magnesium Citrate as directed.   ____ Use CHG Soap or sage wipes as directed on instruction sheet   ____ Use inhalers on the day of surgery and bring to hospital day of surgery  ____ Stop Metformin and Janumet 2 days prior to surgery.    ____ Take 1/2 of usual insulin dose the night before surgery and none on the morning     surgery.   ____ Follow recommendations from Cardiologist, Pulmonologist or PCP regarding stopping Aspirin, Coumadin, Plavix ,Eliquis, Effient, or Pradaxa, and Pletal.  X____Stop Anti-inflammatories such as Advil, Aleve, Ibuprofen, Motrin, Naproxen, Naprosyn, Goodies powders or aspirin products. OK  to take Tylenol OR PERCOCET   ____ Stop supplements until after surgery.    ____ Bring C-Pap to the hospital.

## 2018-05-15 ENCOUNTER — Ambulatory Visit: Payer: BLUE CROSS/BLUE SHIELD | Admitting: Anesthesiology

## 2018-05-15 ENCOUNTER — Ambulatory Visit: Payer: BLUE CROSS/BLUE SHIELD

## 2018-05-15 ENCOUNTER — Ambulatory Visit
Admission: RE | Admit: 2018-05-15 | Discharge: 2018-05-15 | Disposition: A | Discharge status: Home / Self Care | Payer: BLUE CROSS/BLUE SHIELD | Source: Ambulatory Visit | Attending: General Surgery | Admitting: General Surgery

## 2018-05-15 ENCOUNTER — Other Ambulatory Visit: Payer: Self-pay

## 2018-05-15 ENCOUNTER — Encounter: Payer: Self-pay | Admitting: Anesthesiology

## 2018-05-15 ENCOUNTER — Encounter
Admission: RE | Disposition: A | Discharge status: Home / Self Care | Payer: Self-pay | Source: Ambulatory Visit | Attending: General Surgery

## 2018-05-15 DIAGNOSIS — K811 Chronic cholecystitis: Secondary | ICD-10-CM | POA: Insufficient documentation

## 2018-05-15 DIAGNOSIS — F419 Anxiety disorder, unspecified: Secondary | ICD-10-CM | POA: Diagnosis not present

## 2018-05-15 DIAGNOSIS — J45909 Unspecified asthma, uncomplicated: Secondary | ICD-10-CM | POA: Diagnosis not present

## 2018-05-15 DIAGNOSIS — K8020 Calculus of gallbladder without cholecystitis without obstruction: Secondary | ICD-10-CM | POA: Diagnosis not present

## 2018-05-15 DIAGNOSIS — F329 Major depressive disorder, single episode, unspecified: Secondary | ICD-10-CM | POA: Diagnosis not present

## 2018-05-15 DIAGNOSIS — K219 Gastro-esophageal reflux disease without esophagitis: Secondary | ICD-10-CM | POA: Diagnosis not present

## 2018-05-15 DIAGNOSIS — Z79899 Other long term (current) drug therapy: Secondary | ICD-10-CM | POA: Insufficient documentation

## 2018-05-15 DIAGNOSIS — Z419 Encounter for procedure for purposes other than remedying health state, unspecified: Secondary | ICD-10-CM

## 2018-05-15 DIAGNOSIS — I10 Essential (primary) hypertension: Secondary | ICD-10-CM | POA: Diagnosis not present

## 2018-05-15 DIAGNOSIS — K8010 Calculus of gallbladder with chronic cholecystitis without obstruction: Secondary | ICD-10-CM | POA: Diagnosis present

## 2018-05-15 HISTORY — PX: CHOLECYSTECTOMY: SHX55

## 2018-05-15 LAB — POCT PREGNANCY, URINE: PREG TEST UR: NEGATIVE

## 2018-05-15 SURGERY — LAPAROSCOPIC CHOLECYSTECTOMY WITH INTRAOPERATIVE CHOLANGIOGRAM
Anesthesia: General | Wound class: Clean Contaminated

## 2018-05-15 MED ORDER — SUGAMMADEX SODIUM 200 MG/2ML IV SOLN
INTRAVENOUS | Status: DC | PRN
  Administered 2018-05-15: 200 mg via INTRAVENOUS

## 2018-05-15 MED ORDER — ROCURONIUM BROMIDE 100 MG/10ML IV SOLN
INTRAVENOUS | Status: AC
  Filled 2018-05-15: qty 1

## 2018-05-15 MED ORDER — DEXAMETHASONE SODIUM PHOSPHATE 10 MG/ML IJ SOLN
INTRAMUSCULAR | Status: DC | PRN
  Administered 2018-05-15: 4 mg via INTRAVENOUS

## 2018-05-15 MED ORDER — FENTANYL CITRATE (PF) 100 MCG/2ML IJ SOLN
INTRAMUSCULAR | Status: DC | PRN
  Administered 2018-05-15 (×2): 50 ug via INTRAVENOUS

## 2018-05-15 MED ORDER — ONDANSETRON HCL 4 MG/2ML IJ SOLN
INTRAMUSCULAR | Status: AC
  Filled 2018-05-15: qty 2

## 2018-05-15 MED ORDER — SUCCINYLCHOLINE CHLORIDE 20 MG/ML IJ SOLN
INTRAMUSCULAR | Status: AC
  Filled 2018-05-15: qty 1

## 2018-05-15 MED ORDER — KETOROLAC TROMETHAMINE 30 MG/ML IJ SOLN
INTRAMUSCULAR | Status: DC | PRN
  Administered 2018-05-15: 30 mg via INTRAVENOUS

## 2018-05-15 MED ORDER — MIDAZOLAM HCL 2 MG/2ML IJ SOLN
INTRAMUSCULAR | Status: DC | PRN
  Administered 2018-05-15 (×2): 2 mg via INTRAVENOUS

## 2018-05-15 MED ORDER — OXYCODONE-ACETAMINOPHEN 5-325 MG PO TABS
ORAL_TABLET | ORAL | Status: AC
  Filled 2018-05-15: qty 1

## 2018-05-15 MED ORDER — PROPOFOL 10 MG/ML IV BOLUS
INTRAVENOUS | Status: DC | PRN
  Administered 2018-05-15: 200 mg via INTRAVENOUS

## 2018-05-15 MED ORDER — SODIUM CHLORIDE FLUSH 0.9 % IV SOLN
INTRAVENOUS | Status: AC
  Filled 2018-05-15: qty 50

## 2018-05-15 MED ORDER — LIDOCAINE HCL (PF) 2 % IJ SOLN
INTRAMUSCULAR | Status: AC
  Filled 2018-05-15: qty 10

## 2018-05-15 MED ORDER — BUPIVACAINE-EPINEPHRINE 0.5% -1:200000 IJ SOLN
INTRAMUSCULAR | Status: DC | PRN
  Administered 2018-05-15: 26 mL

## 2018-05-15 MED ORDER — FENTANYL CITRATE (PF) 100 MCG/2ML IJ SOLN
25.0000 ug | INTRAMUSCULAR | Status: DC | PRN
  Administered 2018-05-15 (×4): 25 ug via INTRAVENOUS

## 2018-05-15 MED ORDER — ROCURONIUM BROMIDE 100 MG/10ML IV SOLN
INTRAVENOUS | Status: DC | PRN
  Administered 2018-05-15: 20 mg via INTRAVENOUS
  Administered 2018-05-15: 25 mg via INTRAVENOUS
  Administered 2018-05-15: 5 mg via INTRAVENOUS

## 2018-05-15 MED ORDER — MIDAZOLAM HCL 2 MG/2ML IJ SOLN
INTRAMUSCULAR | Status: AC
  Filled 2018-05-15: qty 2

## 2018-05-15 MED ORDER — FENTANYL CITRATE (PF) 100 MCG/2ML IJ SOLN
INTRAMUSCULAR | Status: AC
  Filled 2018-05-15: qty 2

## 2018-05-15 MED ORDER — ONDANSETRON HCL 4 MG/2ML IJ SOLN
INTRAMUSCULAR | Status: DC | PRN
  Administered 2018-05-15: 4 mg via INTRAVENOUS

## 2018-05-15 MED ORDER — HYDROMORPHONE HCL 1 MG/ML IJ SOLN
INTRAMUSCULAR | Status: DC | PRN
  Administered 2018-05-15 (×2): 0.5 mg via INTRAVENOUS

## 2018-05-15 MED ORDER — PROPOFOL 10 MG/ML IV BOLUS
INTRAVENOUS | Status: AC
  Filled 2018-05-15: qty 20

## 2018-05-15 MED ORDER — KETOROLAC TROMETHAMINE 30 MG/ML IJ SOLN
INTRAMUSCULAR | Status: AC
  Filled 2018-05-15: qty 1

## 2018-05-15 MED ORDER — GABAPENTIN 300 MG PO CAPS
ORAL_CAPSULE | ORAL | Status: AC
  Filled 2018-05-15: qty 1

## 2018-05-15 MED ORDER — ACETAMINOPHEN 10 MG/ML IV SOLN
INTRAVENOUS | Status: AC
  Filled 2018-05-15: qty 100

## 2018-05-15 MED ORDER — SUCCINYLCHOLINE CHLORIDE 20 MG/ML IJ SOLN
INTRAMUSCULAR | Status: DC | PRN
  Administered 2018-05-15: 120 mg via INTRAVENOUS

## 2018-05-15 MED ORDER — SUGAMMADEX SODIUM 200 MG/2ML IV SOLN
INTRAVENOUS | Status: AC
  Filled 2018-05-15: qty 2

## 2018-05-15 MED ORDER — ACETAMINOPHEN 10 MG/ML IV SOLN
INTRAVENOUS | Status: DC | PRN
  Administered 2018-05-15: 1000 mg via INTRAVENOUS

## 2018-05-15 MED ORDER — LACTATED RINGERS IV SOLN
INTRAVENOUS | Status: DC
  Administered 2018-05-15: 12:00:00 via INTRAVENOUS

## 2018-05-15 MED ORDER — OXYCODONE-ACETAMINOPHEN 5-325 MG PO TABS
1.0000 | ORAL_TABLET | Freq: Three times a day (TID) | ORAL | Status: DC | PRN
  Administered 2018-05-15: 1 via ORAL

## 2018-05-15 MED ORDER — GABAPENTIN 300 MG PO CAPS
300.0000 mg | ORAL_CAPSULE | ORAL | Status: AC
  Administered 2018-05-15: 300 mg via ORAL

## 2018-05-15 MED ORDER — LIDOCAINE HCL (CARDIAC) PF 100 MG/5ML IV SOSY
PREFILLED_SYRINGE | INTRAVENOUS | Status: DC | PRN
  Administered 2018-05-15: 80 mg via INTRAVENOUS

## 2018-05-15 MED ORDER — BUPIVACAINE-EPINEPHRINE (PF) 0.5% -1:200000 IJ SOLN
INTRAMUSCULAR | Status: AC
  Filled 2018-05-15: qty 30

## 2018-05-15 MED ORDER — DEXAMETHASONE SODIUM PHOSPHATE 10 MG/ML IJ SOLN
INTRAMUSCULAR | Status: AC
  Filled 2018-05-15: qty 1

## 2018-05-15 MED ORDER — FENTANYL CITRATE (PF) 100 MCG/2ML IJ SOLN
INTRAMUSCULAR | Status: AC
  Administered 2018-05-15: 25 ug via INTRAVENOUS
  Filled 2018-05-15: qty 2

## 2018-05-15 MED ORDER — FAMOTIDINE 20 MG PO TABS
20.0000 mg | ORAL_TABLET | Freq: Once | ORAL | Status: AC
  Administered 2018-05-15: 20 mg via ORAL

## 2018-05-15 MED ORDER — ONDANSETRON HCL 4 MG/2ML IJ SOLN: 4.0000 mg | Freq: Once | INTRAMUSCULAR | Status: DC | PRN

## 2018-05-15 MED ORDER — FAMOTIDINE 20 MG PO TABS
ORAL_TABLET | ORAL | Status: AC
  Filled 2018-05-15: qty 1

## 2018-05-15 MED ORDER — SODIUM CHLORIDE 0.9 % IV SOLN
INTRAVENOUS | Status: DC | PRN
  Administered 2018-05-15: 10 mL

## 2018-05-15 MED ORDER — HYDROMORPHONE HCL 1 MG/ML IJ SOLN
INTRAMUSCULAR | Status: AC
  Filled 2018-05-15: qty 1

## 2018-05-15 SURGICAL SUPPLY — 37 items
APPLIER CLIP ROT 10 11.4 M/L (STAPLE) ×2
BLADE SURG 11 STRL SS SAFETY (MISCELLANEOUS) ×2 IMPLANT
CANISTER SUCT 1200ML W/VALVE (MISCELLANEOUS) ×2 IMPLANT
CANNULA DILATOR 10 W/SLV (CANNULA) ×2 IMPLANT
CANNULA DILATOR 5 W/SLV (CANNULA) ×4 IMPLANT
CATH CHOLANG 76X19 KUMAR (CATHETERS) ×2 IMPLANT
CHLORAPREP W/TINT 26ML (MISCELLANEOUS) ×4 IMPLANT
CLIP APPLIE ROT 10 11.4 M/L (STAPLE) ×1 IMPLANT
CONRAY 60ML FOR OR (MISCELLANEOUS) ×2 IMPLANT
DISSECTOR KITTNER STICK (MISCELLANEOUS) ×1 IMPLANT
DISSECTORS/KITTNER STICK (MISCELLANEOUS) ×2
DRAPE SHEET LG 3/4 BI-LAMINATE (DRAPES) ×2 IMPLANT
DRSG TEGADERM 2-3/8X2-3/4 SM (GAUZE/BANDAGES/DRESSINGS) ×8 IMPLANT
DRSG TELFA 4X3 1S NADH ST (GAUZE/BANDAGES/DRESSINGS) ×2 IMPLANT
ELECT REM PT RETURN 9FT ADLT (ELECTROSURGICAL) ×2
ELECTRODE REM PT RTRN 9FT ADLT (ELECTROSURGICAL) ×1 IMPLANT
GLOVE BIO SURGEON STRL SZ7.5 (GLOVE) ×4 IMPLANT
GLOVE INDICATOR 8.0 STRL GRN (GLOVE) ×2 IMPLANT
GOWN STRL REUS W/ TWL LRG LVL3 (GOWN DISPOSABLE) ×3 IMPLANT
GOWN STRL REUS W/TWL LRG LVL3 (GOWN DISPOSABLE) ×3
IRRIGATION STRYKERFLOW (MISCELLANEOUS) ×1 IMPLANT
IRRIGATOR STRYKERFLOW (MISCELLANEOUS) ×2
IV LACTATED RINGERS 1000ML (IV SOLUTION) ×2 IMPLANT
KIT TURNOVER KIT A (KITS) ×2 IMPLANT
LABEL OR SOLS (LABEL) ×2 IMPLANT
NDL INSUFF ACCESS 14 VERSASTEP (NEEDLE) ×2 IMPLANT
NS IRRIG 500ML POUR BTL (IV SOLUTION) ×2 IMPLANT
PACK LAP CHOLECYSTECTOMY (MISCELLANEOUS) ×2 IMPLANT
POUCH SPECIMEN RETRIEVAL 10MM (ENDOMECHANICALS) IMPLANT
SCISSORS METZENBAUM CVD 33 (INSTRUMENTS) ×2 IMPLANT
STRIP CLOSURE SKIN 1/2X4 (GAUZE/BANDAGES/DRESSINGS) ×2 IMPLANT
SUT VIC AB 0 CT2 27 (SUTURE) ×2 IMPLANT
SUT VIC AB 4-0 FS2 27 (SUTURE) ×2 IMPLANT
SWABSTK COMLB BENZOIN TINCTURE (MISCELLANEOUS) ×2 IMPLANT
TROCAR XCEL NON-BLD 11X100MML (ENDOMECHANICALS) ×2 IMPLANT
TUBING INSUFFLATION (TUBING) ×2 IMPLANT
WATER STERILE IRR 1000ML POUR (IV SOLUTION) ×2 IMPLANT

## 2018-05-15 NOTE — Discharge Instructions (Signed)

## 2018-05-15 NOTE — Anesthesia Preprocedure Evaluation (Signed)
Anesthesia Evaluation  Patient identified by MRN, date of birth, ID band Patient awake    Reviewed: Allergy & Precautions, NPO status , Patient's Chart, lab work & pertinent test results, reviewed documented beta blocker date and time   History of Anesthesia Complications (+) Family history of anesthesia reaction  Airway Mallampati: III  TM Distance: >3 FB     Dental  (+) Chipped   Pulmonary asthma ,           Cardiovascular hypertension, Pt. on medications + dysrhythmias + Valvular Problems/Murmurs      Neuro/Psych PSYCHIATRIC DISORDERS Anxiety Depression    GI/Hepatic GERD  Controlled,  Endo/Other    Renal/GU Renal disease     Musculoskeletal   Abdominal   Peds  Hematology   Anesthesia Other Findings Echo ok. Hx of tachycardia.  Reproductive/Obstetrics                             Anesthesia Physical Anesthesia Plan  ASA: II  Anesthesia Plan: General   Post-op Pain Management:    Induction: Intravenous  PONV Risk Score and Plan:   Airway Management Planned: Oral ETT  Additional Equipment:   Intra-op Plan:   Post-operative Plan:   Informed Consent: I have reviewed the patients History and Physical, chart, labs and discussed the procedure including the risks, benefits and alternatives for the proposed anesthesia with the patient or authorized representative who has indicated his/her understanding and acceptance.     Plan Discussed with: CRNA  Anesthesia Plan Comments:         Anesthesia Quick Evaluation

## 2018-05-15 NOTE — Op Note (Signed)
Preoperative diagnosis: Chronic cholecystitis and cholelithiasis.  Postoperative diagnosis: Chronic cholecystitis.  Operative procedure: Laparoscopic cholecystectomy with intraoperative cholangiograms.  Operating Surgeon: Donnalee Curry, MD.  Anesthesia: General endotracheal, Marcaine 0.5% with 1 to 200,000 units of epinephrine, 26 cc local infiltration.  Estimated blood loss: Less than 5 cc.  Clinical note: This 27 year old has had episodic abdominal pain and ultrasound showed evidence of cholelithiasis.  Normal liver function studies.  No common bile duct dilatation.  She was admitted for elective cholecystectomy.  SCD stockings for DVT prevention.  Prophylactic antibiotics not indicated.  Operative note: With the patient under adequate general endotracheal anesthesia the abdomen was cleansed with ChloraPrep and drapedndelenburg position a varies needle was placed through a trans-umbilical incision.  After assuring intra-abdominal location with a hanging drop test the abdomen was insufflated with CO2 of 10 mmHg pressure.  A 10 mm Step port was expanded.  Inspection showed no evidence of injury from initial port placement.  Inspection showed no adhesions from her previous C-sections.  The patient was placed in reverse Trendelenburg position and rolled to the left.  The gallbladder was noted to be moderately distended.  Few filmy adhesions of the omentum to the undersurface.  Mild edema around the neck of the gallbladder.  An 11 mm XL port was placed into the epigastrium and 2-5 mm Step ports were placed in the right lateral abdominal wall.  The gallbladder was placed on cephalad traction.  The neck of the gallbladder was cleared.  Fluoroscopic cholangiograms were completed using 10 cc of one half strength Conray 60.  This showed prompt filling of the right and left hepatic ducts, free flow into the common bile duct and into the duodenum.  No evidence of retained stones. The gallbladder was  decompressed with the catheter.  The cystic duct was doubly clipped patient's of the neck of the gallbladder and the cystic artery treated in a similar fashion.  1 of the 2 clips on the proximal artery was loose and was subsequently removed.  It was felt prudent not to try to place a second clip due to the short space between the cystic artery and the branch artery above.  An additional artery from the bed of the gallbladder was controlled with clips as well.  The gallbladder was removed from the liver bed making use of hook cautery dissection.  It was delivered to the umbilical port site.  The open gallbladder did not show any clear-cut stones.  The right upper quadrant was irrigated with lactated Ringer solution.  The abdomen was then desufflated and ports removed under direct vision.  Skin incisions were closed with 4-0 Vicryl subcuticular sutures.  Benzoin, Steri-Strips, Telfa and Tegaderm dressings were applied.  The patient tolerated the procedure well and was taken to recovery room in stable condition.

## 2018-05-15 NOTE — Anesthesia Postprocedure Evaluation (Signed)
Anesthesia Post Note  Patient: Megan Kramer  Procedure(s) Performed: LAPAROSCOPIC CHOLECYSTECTOMY WITH INTRAOPERATIVE CHOLANGIOGRAM (N/A )  Patient location during evaluation: PACU Anesthesia Type: General Level of consciousness: awake and alert Pain management: pain level controlled Vital Signs Assessment: post-procedure vital signs reviewed and stable Respiratory status: spontaneous breathing, nonlabored ventilation, respiratory function stable and patient connected to nasal cannula oxygen Cardiovascular status: blood pressure returned to baseline and stable Postop Assessment: no apparent nausea or vomiting Anesthetic complications: no     Last Vitals:  Vitals:   05/15/18 1444 05/15/18 1449  BP:  118/64  Pulse: 80 79  Resp: 10 13  Temp:  37.4 C  SpO2: 97% 100%    Last Pain:  Vitals:   05/15/18 1449  TempSrc:   PainSc: 2                  Zoe Goonan S

## 2018-05-15 NOTE — Anesthesia Procedure Notes (Signed)
Procedure Name: Intubation Date/Time: 05/15/2018 12:13 PM Performed by: Geraldine Contras, CRNA Pre-anesthesia Checklist: Patient identified, Emergency Drugs available, Suction available, Patient being monitored and Timeout performed Patient Re-evaluated:Patient Re-evaluated prior to induction Oxygen Delivery Method: Circle system utilized Preoxygenation: Pre-oxygenation with 100% oxygen Induction Type: IV induction Ventilation: Mask ventilation without difficulty Laryngoscope Size: Mac and 3 Grade View: Grade I Tube type: Oral Tube size: 7.0 mm Number of attempts: 1 Airway Equipment and Method: Stylet Placement Confirmation: ETT inserted through vocal cords under direct vision,  positive ETCO2 and breath sounds checked- equal and bilateral Secured at: 21 cm Tube secured with: Tape Dental Injury: Teeth and Oropharynx as per pre-operative assessment

## 2018-05-15 NOTE — Anesthesia Post-op Follow-up Note (Deleted)
Anesthesia QCDR form completed.        

## 2018-05-15 NOTE — Transfer of Care (Signed)
Immediate Anesthesia Transfer of Care Note  Patient: Megan Kramer  Procedure(s) Performed: LAPAROSCOPIC CHOLECYSTECTOMY WITH INTRAOPERATIVE CHOLANGIOGRAM (N/A )  Patient Location: PACU  Anesthesia Type:General  Level of Consciousness: sedated  Airway & Oxygen Therapy: Spontaneous breathing  Post-op Assessment: Report given to RN  Post vital signs: stable  Last Vitals:  Vitals Value Taken Time  BP 109/56 05/15/2018  1:39 PM  Temp 36.6 C 05/15/2018  1:39 PM  Pulse 80 05/15/2018  1:44 PM  Resp 13 05/15/2018  1:44 PM  SpO2 100 % 05/15/2018  1:44 PM  Vitals shown include unvalidated device data.  Last Pain:  Vitals:   05/15/18 1145  TempSrc: Oral  PainSc: 4          Complications: No apparent anesthesia complications

## 2018-05-15 NOTE — H&P (Signed)
No change in clinical history or exam. For cholecystectomy.  

## 2018-05-15 NOTE — Anesthesia Post-op Follow-up Note (Signed)
Anesthesia QCDR form completed.        

## 2018-05-16 ENCOUNTER — Encounter: Payer: Self-pay | Admitting: General Surgery

## 2018-05-19 LAB — SURGICAL PATHOLOGY

## 2018-05-28 ENCOUNTER — Encounter: Payer: Self-pay | Admitting: General Surgery

## 2018-05-28 ENCOUNTER — Ambulatory Visit (INDEPENDENT_AMBULATORY_CARE_PROVIDER_SITE_OTHER): Payer: BLUE CROSS/BLUE SHIELD | Admitting: General Surgery

## 2018-05-28 VITALS — BP 122/82 | HR 80 | Resp 12 | Ht 60.0 in | Wt 142.0 lb

## 2018-05-28 DIAGNOSIS — K8020 Calculus of gallbladder without cholecystitis without obstruction: Secondary | ICD-10-CM

## 2018-05-28 NOTE — Patient Instructions (Signed)
Return as needed

## 2018-05-28 NOTE — Progress Notes (Signed)
Patient ID: Megan Kramer, female   DOB: 05-29-1991, 27 y.o.   MRN: [id number]  Chief Complaint  Patient presents with  . Routine Post Op    HPI ROSHANA Megan Kramer is a 27 y.o. female here today for her post op gallbladder surgery done on 05/15/2018. Patient states she is doing well.   Past Medical History:  Diagnosis Date  . Anxiety   . Asthma    EXERCISE INDUCED-NO INHALER NOW  . Dysrhythmia    TACHYCARDIA WITH 1ST PREGNANCY-ECHO DONE AND WAS WNL-ASYMPTOMATIC NOW  . Family history of adverse reaction to anesthesia    MOM- N/V  . GERD (gastroesophageal reflux disease)    H/O WITH PREGNANCY  . Heart murmur    AS A CHILD-ASYMPTOMATIC  . Herpes   . History of kidney stones   . Hypertension    pregnancy induced  . IBS (irritable bowel syndrome)   . Obesity (BMI 30-39.9)   . Postpartum depression associated with first pregnancy   . Second pregnancy     Past Surgical History:  Procedure Laterality Date  . CESAREAN SECTION  12/21/2014   Teresa Coombs, [place]  . CESAREAN SECTION N/A 05/27/2017   Procedure: CESAREAN SECTION;  Surgeon: Nadara Mustard, MD;  Location: [facility] ORS;  Service: Obstetrics;  Laterality: N/A;  . CHOLECYSTECTOMY N/A 05/15/2018   Procedure: LAPAROSCOPIC CHOLECYSTECTOMY WITH INTRAOPERATIVE CHOLANGIOGRAM;  Surgeon: Earline Mayotte, MD;  Location: [facility] ORS;  Service: General;  Laterality: N/A;  . CYSTOSCOPY/URETEROSCOPY/HOLMIUM LASER/STENT PLACEMENT Right 02/27/2018   Procedure: CYSTOSCOPY/URETEROSCOPY/HOLMIUM LASER/STENT PLACEMENT;  Surgeon: Riki Altes, MD;  Location: [facility] ORS;  Service: Urology;  Laterality: Right;  . OTHER SURGICAL HISTORY     mole bx   . WISDOM TOOTH EXTRACTION      Family History  Problem Relation Age of Onset  . Hypertension Mother   . Depression Mother   . Hyperlipidemia Mother   . Diverticulosis Mother   . Hearing loss Father   . Hypertension Father   . Irritable bowel syndrome Father   . Hyperlipidemia Father   . Breast  cancer Maternal Grandmother   . Miscarriages / Stillbirths Maternal Grandmother   . Cancer Maternal Grandmother        breast   . Diabetes Maternal Grandfather   . Heart disease Maternal Grandfather        MI  . Arthritis Paternal Grandfather   . Irritable bowel syndrome Paternal Aunt   . GER disease Paternal Aunt   . Crohn's disease Paternal Aunt   . Diabetes Other     Social History Social History   Tobacco Use  . Smoking status: Never Smoker  . Smokeless tobacco: Never Used  Substance Use Topics  . Alcohol use: Yes    Comment: occasion  . Drug use: No    Allergies  Allergen Reactions  . Codeine Nausea Only and Nausea And Vomiting  . Latex Rash    Current Outpatient Medications  Medication Sig Dispense Refill  . buPROPion (WELLBUTRIN XL) 150 MG 24 hr tablet Take 1 tablet (150 mg total) by mouth at bedtime. 30 tablet 5  . norgestimate-ethinyl estradiol (ORTHO-CYCLEN,SPRINTEC,PREVIFEM) 0.25-35 MG-MCG tablet Take 1 tablet by mouth daily. 1 Package 11  . promethazine (PHENERGAN) 25 MG tablet Take 1 tablet (25 mg total) by mouth every 6 (six) hours as needed for nausea or vomiting. 20 tablet 0  . sertraline (ZOLOFT) 50 MG tablet Take 1 tablet (50 mg total) by mouth daily. 30 tablet 5  No current facility-administered medications for this visit.     Review of Systems Review of Systems  Constitutional: Negative.   Respiratory: Negative.   Cardiovascular: Negative.     Blood pressure 122/82, pulse 80, resp. rate 12, height 5' (1.524 m), weight 142 lb (64.4 kg), last menstrual period 05/15/2018.  Physical Exam Physical Exam  Constitutional: She is oriented to person, place, and time. She appears well-developed and well-nourished.  Cardiovascular: Normal rate and regular rhythm.  Pulmonary/Chest: Effort normal and breath sounds normal.  Abdominal: Soft. Bowel sounds are normal. There is no tenderness.  Port sites are clean and well healed.   Neurological: She is  alert and oriented to person, place, and time.  Skin: Skin is warm.    Data Reviewed DIAGNOSIS:  A. GALLBLADDER; CHOLECYSTECTOMY:  - CHRONIC CHOLECYSTITIS.  - CHOLESTEROLOSIS.  - REACTIVE CYSTIC DUCT LYMPH NODE.  - NEGATIVE FOR MALIGNANCY.   Assessment    Doing well post cholecystectomy.     Plan   Patient to return as needed. The patient is aware to call back for any questions or concerns.   HPI, Physical Exam, Assessment and Plan have been scribed under the direction and in the presence of Donnalee CurryJeffrey Byrnett, MD.  Ples SpecterJessica Qualls, CMA  I have completed the exam and reviewed the above documentation for accuracy and completeness.  I agree with the above.  [employer] has been used and any errors in dictation or transcription are unintentional.  Donnalee CurryJeffrey Byrnett, M.D., F.A.C.S.  Merrily PewJeffrey W Byrnett 05/29/2018, 8:08 PM

## 2018-06-15 ENCOUNTER — Encounter: Payer: Self-pay | Admitting: Emergency Medicine

## 2018-06-15 ENCOUNTER — Emergency Department: Payer: BLUE CROSS/BLUE SHIELD

## 2018-06-15 ENCOUNTER — Emergency Department
Admission: EM | Admit: 2018-06-15 | Discharge: 2018-06-15 | Disposition: A | Discharge status: Home / Self Care | Payer: BLUE CROSS/BLUE SHIELD | Attending: Emergency Medicine | Admitting: Emergency Medicine

## 2018-06-15 DIAGNOSIS — I10 Essential (primary) hypertension: Secondary | ICD-10-CM | POA: Diagnosis not present

## 2018-06-15 DIAGNOSIS — J45909 Unspecified asthma, uncomplicated: Secondary | ICD-10-CM | POA: Insufficient documentation

## 2018-06-15 DIAGNOSIS — S20219A Contusion of unspecified front wall of thorax, initial encounter: Secondary | ICD-10-CM | POA: Insufficient documentation

## 2018-06-15 DIAGNOSIS — Y929 Unspecified place or not applicable: Secondary | ICD-10-CM | POA: Diagnosis not present

## 2018-06-15 DIAGNOSIS — Y939 Activity, unspecified: Secondary | ICD-10-CM | POA: Insufficient documentation

## 2018-06-15 DIAGNOSIS — Z91040 Latex allergy status: Secondary | ICD-10-CM | POA: Insufficient documentation

## 2018-06-15 DIAGNOSIS — Z79899 Other long term (current) drug therapy: Secondary | ICD-10-CM | POA: Insufficient documentation

## 2018-06-15 DIAGNOSIS — Y999 Unspecified external cause status: Secondary | ICD-10-CM | POA: Insufficient documentation

## 2018-06-15 DIAGNOSIS — S161XXA Strain of muscle, fascia and tendon at neck level, initial encounter: Secondary | ICD-10-CM | POA: Diagnosis not present

## 2018-06-15 DIAGNOSIS — S29091A Other injury of muscle and tendon of front wall of thorax, initial encounter: Secondary | ICD-10-CM | POA: Diagnosis present

## 2018-06-15 MED ORDER — CYCLOBENZAPRINE HCL 10 MG PO TABS: 10.0000 mg | ORAL_TABLET | Freq: Once | ORAL | Status: DC

## 2018-06-15 MED ORDER — ORPHENADRINE CITRATE ER 100 MG PO TB12: 100.0000 mg | ORAL_TABLET | Freq: Two times a day (BID) | ORAL | 0 refills | Status: DC

## 2018-06-15 MED ORDER — TRAMADOL HCL 50 MG PO TABS
50.0000 mg | ORAL_TABLET | Freq: Once | ORAL | Status: AC
  Administered 2018-06-15: 50 mg via ORAL
  Filled 2018-06-15: qty 1

## 2018-06-15 MED ORDER — OXYCODONE-ACETAMINOPHEN 5-325 MG PO TABS: 1.0000 | ORAL_TABLET | Freq: Four times a day (QID) | ORAL | 0 refills | Status: DC | PRN

## 2018-06-15 MED ORDER — LORAZEPAM 1 MG PO TABS
2.0000 mg | ORAL_TABLET | Freq: Once | ORAL | Status: AC
  Administered 2018-06-15: 2 mg via ORAL
  Filled 2018-06-15: qty 2

## 2018-06-15 MED ORDER — IBUPROFEN 600 MG PO TABS: 600.0000 mg | ORAL_TABLET | Freq: Three times a day (TID) | ORAL | 0 refills | Status: DC | PRN
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
## 2018-06-15 NOTE — Discharge Instructions (Signed)
Follow discharge care instruction take medication as directed. °
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
## 2018-06-22 ENCOUNTER — Other Ambulatory Visit: Payer: Self-pay

## 2018-06-22 MED ORDER — NORGESTIMATE-ETH ESTRADIOL 0.25-35 MG-MCG PO TABS: 1.0000 | ORAL_TABLET | Freq: Every day | ORAL | 0 refills | Status: DC

## 2018-06-22 NOTE — Telephone Encounter (Signed)
Pt has annual scheduled for 7/19 and needs refill of bcp.  [phone] Left detailed msg refill eRx'd.

## 2018-07-02 IMAGING — US US RENAL
1 series · 14 of 25 positions shown · non-contrast
Comparison: None.

CLINICAL DATA: Left flank pain radiating to the suprapubic region.
Third trimester pregnancy.

EXAM:
RENAL / URINARY TRACT ULTRASOUND COMPLETE

[Series 1: us renal · 0.26mm/px · 41 acquisitions, 14 frames shown]
[im 1/41]
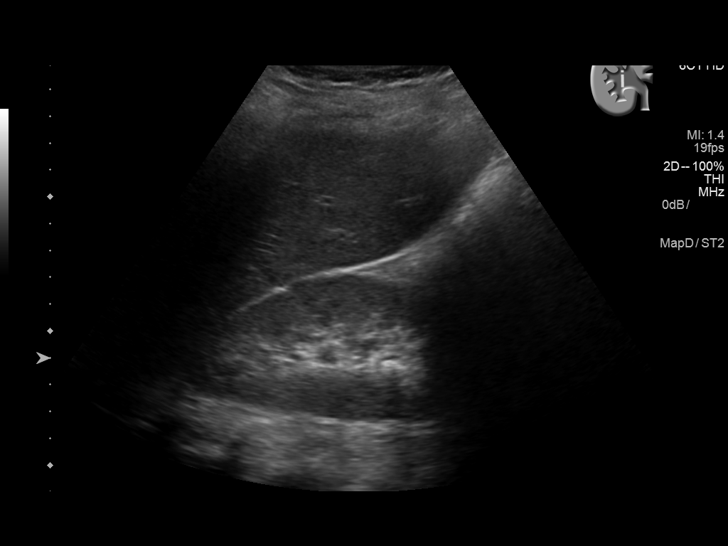
[im 4/41]
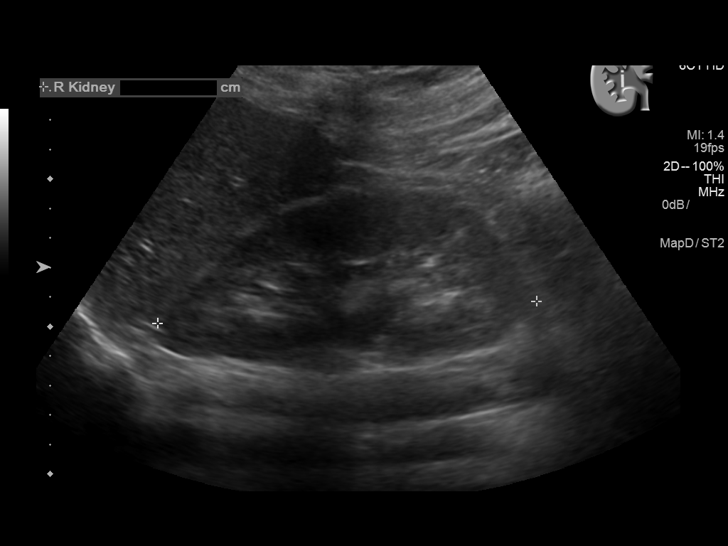
[im 7/41]
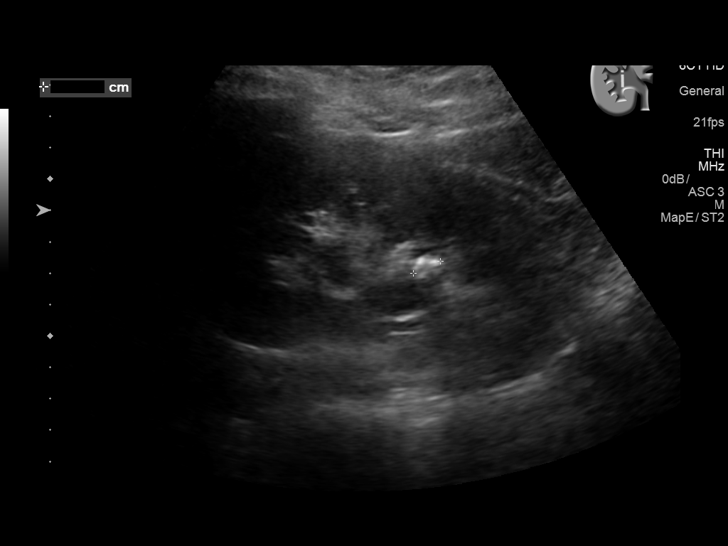
[im 11/41]
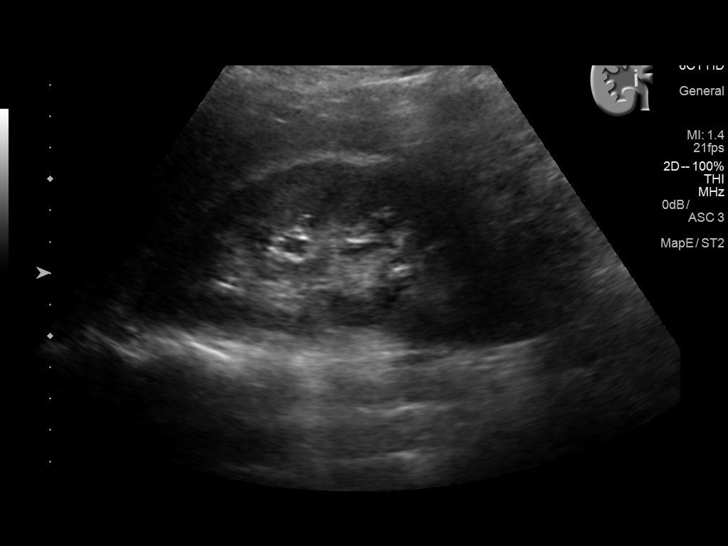
[im 14/41]
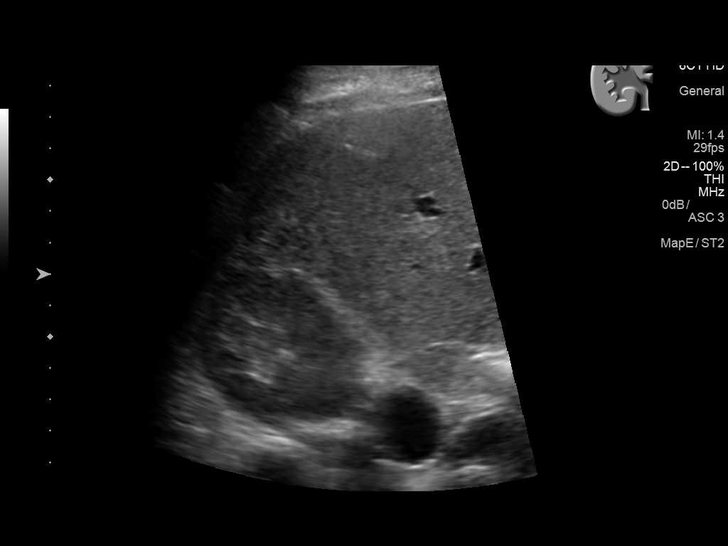
[im 16/41]
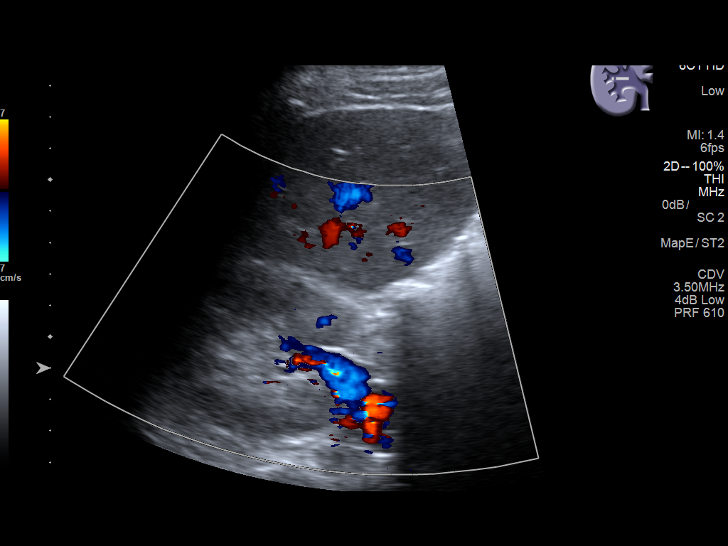
[im 19/41]
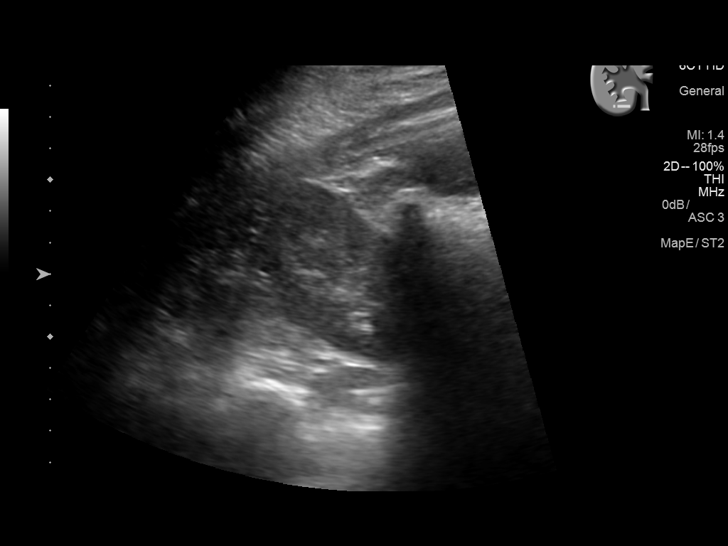
[im 22/41]
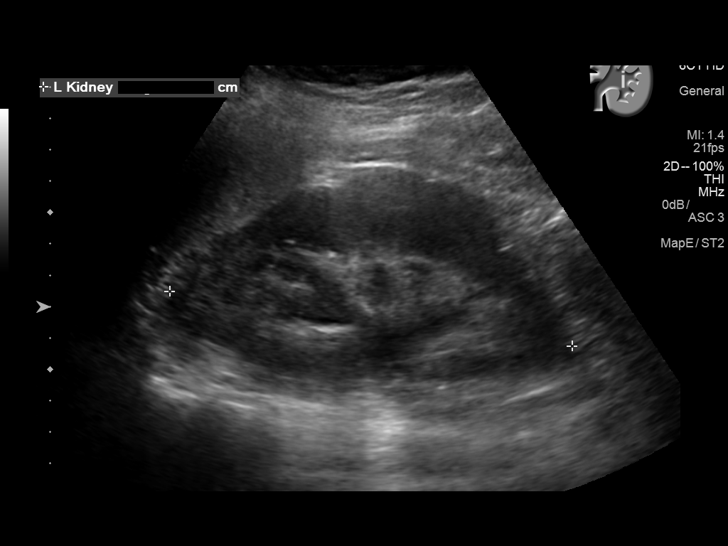
[im 26/41]
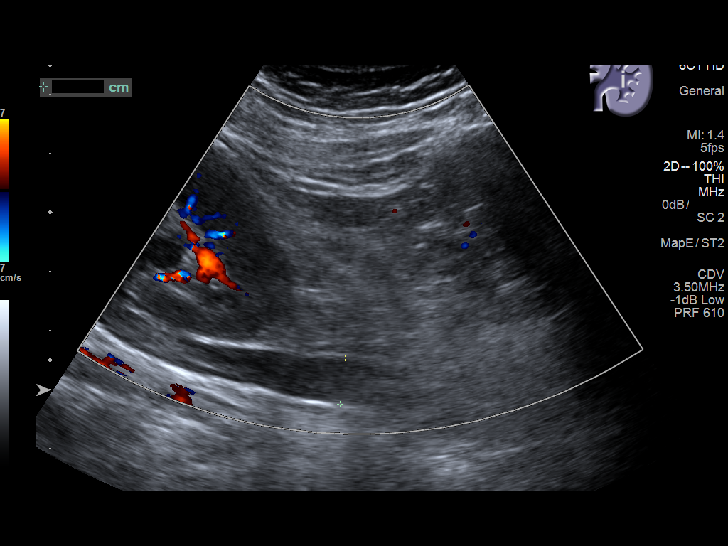
[im 27/41]
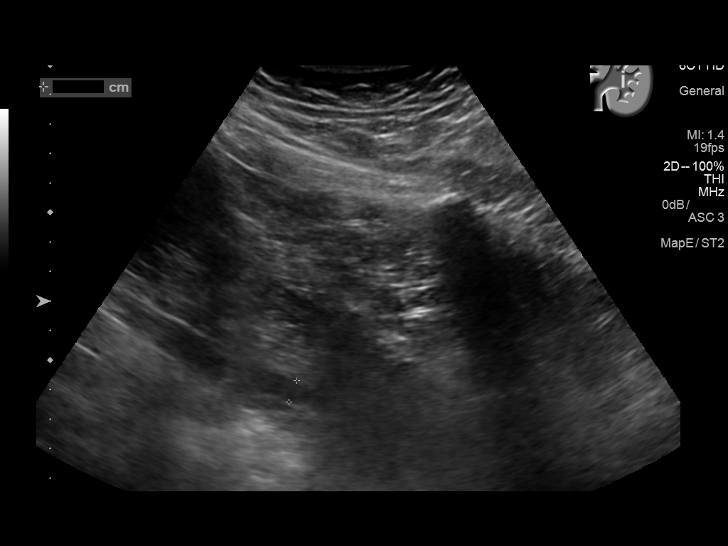
[im 31/41]
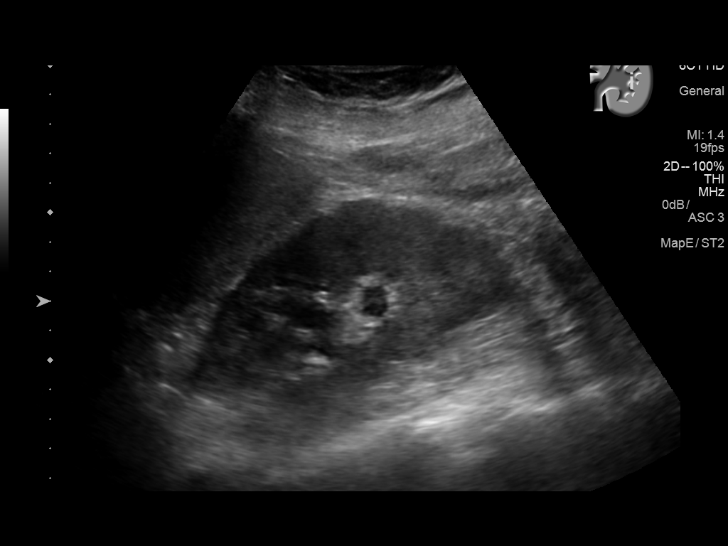
[im 34/41]
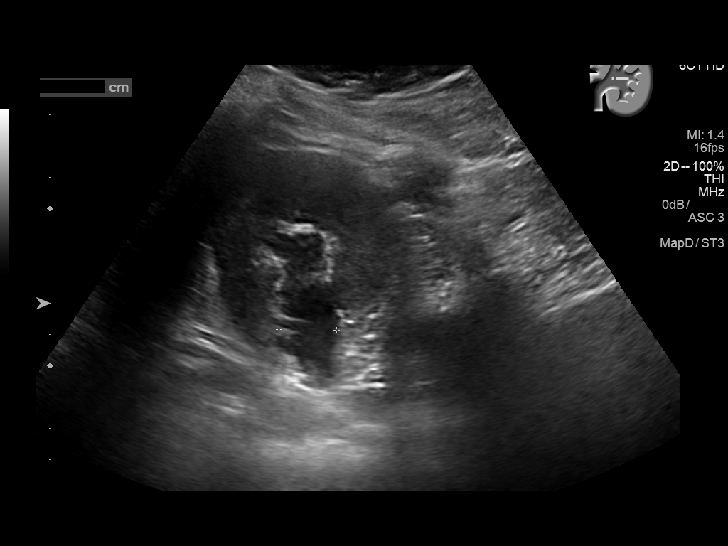
[im 37/41]
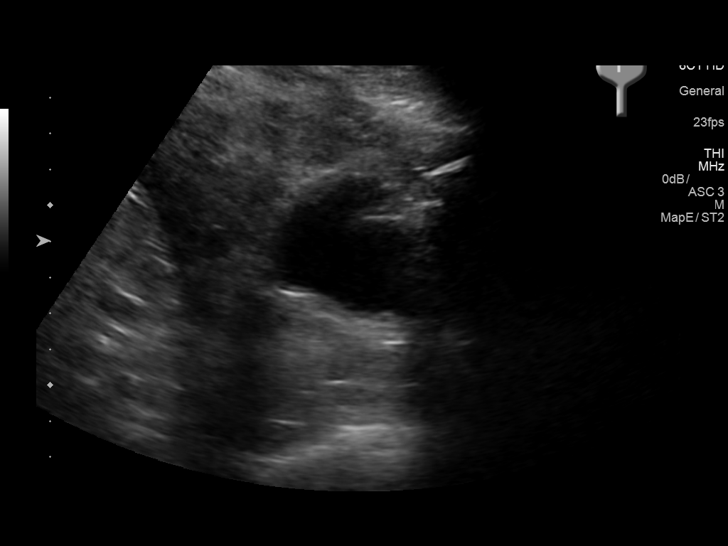
[im 41/41]
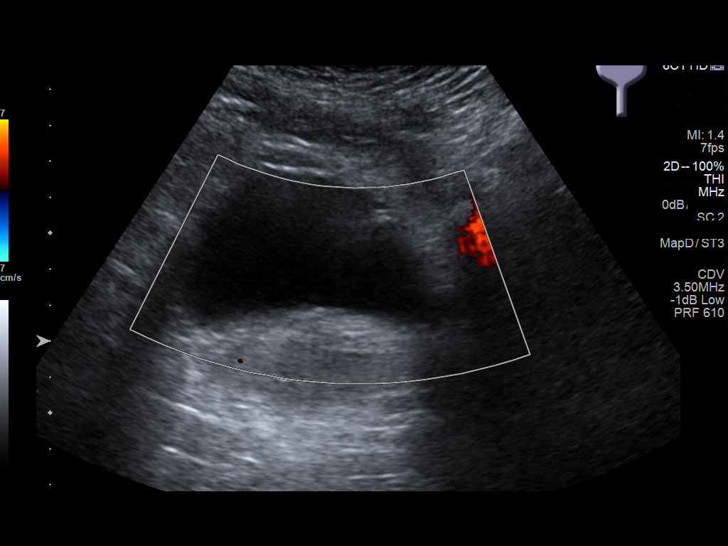

[14 of 25 positions shown; findings below may reference images not displayed]

FINDINGS: Right Kidney:

Length: 12.9 cm. 9 mm calculus or cluster of calculi in the right
kidney lower pole, nonobstructive. Normal echogenicity. No
hydronephrosis.

Left Kidney:

Length: 12.9 cm. Moderate hydronephrosis and proximal hydroureter,
with the proximal ureter measuring 1.6 cm in diameter. The left
ureter also appears dilated distally. I do not see a definite
ureteral calculus, the cause of the presumed left-sided obstruction
is not directly visualized.

Bladder:

Appears normal for degree of bladder distention, however no ureteral
jets were observed on either side.
IMPRESSION: 1. Moderate left hydronephrosis and moderate left hydroureter. The
hydroureter involves the proximal and distal ureter. No definite
left-sided renal stones are identified, but there are nonobstructive
right (contralateral) kidney lower pole calculi seen.

## 2018-07-09 NOTE — Progress Notes (Signed)
Gynecology Annual Exam  PCP: McLean-Scocuzza, Pasty Spillersracy N, MD  Chief Complaint:  Chief Complaint  Patient presents with  . Gynecologic Exam    Refill for Welbutrin    History of Present Illness: Megan Kramer is a 27 y.o. WF G2P2002 who presents for her annual gyn exam. The patient complains of intermenstrual spotting and heavy menses on her Orthocyclen. She does admit to forgetting to take her pills on occasion. She has had three surgeries over the last 13 months: a repeat Cesarean section in June 2018, a cystoscopy/ ureteroscopy/laser/ stent placement in March 2019, and a laparoscopic cholecystectomy in May 2019. In addition, has been dealing with anxiety and depression that worsened postpartum. She add her husband are having problems and may be separating. She was begun on Wellbutrin postpartum for depression by Dr Tiburcio PeaHarris which she feels has really helped. She was also begun on Zoloft for anxiety by her PCP, which she feels was not helping and she stopped taking. Has not been back to see her PCP. She has [facility]: Ramond Dialathan Blake She also has concerns regarding tenderness along her right sternal border x a few weeks. Denies trauma. Her menses are regular, they occur every month, and they last 7 days. Her flow is heavy on most days requiring a pad and tampon change every 2 hours She does have intermenstrual bleeding. Her last menstrual period was 06/17/2018. She has mild cramping, that usually does not require medication Last pap smear: 10/01/2016, results were NIL   The patient is sexually active. She currently uses OCPs for contraception. She does not have dyspareunia.  Her past medical history is remarkable for IBS, herpes genitalis, kidney stones, anxiety, depression, and hyperlipidemia  The patient does perform self breast exams. Her last mammogram was NA  There is a family history of breast cancer in her maternal grandmother Genetic  testing has not been done.  There is no family history of ovarian cancer.  The patient denies smoking.  She reports drinking alcohol. She reports drinking occasionally   She denies illegal drug use.  The patient does not exercise.  The patient reports current symptoms of depression.    Review of Systems: Review of Systems  Constitutional: Positive for weight loss. Negative for chills and fever.       Positive for change in appetite  HENT: Negative for congestion, sinus pain and sore throat.   Eyes: Negative for blurred vision and pain.  Respiratory: Negative for hemoptysis, shortness of breath and wheezing.   Cardiovascular: Positive for chest pain. Negative for palpitations and leg swelling.  Gastrointestinal: Positive for diarrhea, nausea and vomiting. Negative for abdominal pain, blood in stool and heartburn.  Genitourinary: Negative for dysuria, frequency, hematuria and urgency.       Positive for menorrhagia and intermenstrual bleeding  Musculoskeletal: Negative for back pain, joint pain and myalgias.  Skin: Negative for itching and rash.  Neurological: Negative for dizziness, tingling and headaches.  Endo/Heme/Allergies: Negative for environmental allergies and polydipsia. Does not bruise/bleed easily.       Negative for hirsutism   Psychiatric/Behavioral: Positive for depression. The patient is nervous/anxious. The patient does not have insomnia.     Past Medical History:  Past Medical History:  Diagnosis Date  . Anxiety   . Asthma    EXERCISE INDUCED-NO INHALER NOW  . Chronic cholecystitis without calculus   . Depression   . Dysrhythmia    TACHYCARDIA WITH 1ST  PREGNANCY-ECHO DONE AND WAS WNL-ASYMPTOMATIC NOW  . Family history of adverse reaction to anesthesia    MOM- N/V  . GERD (gastroesophageal reflux disease)    H/O WITH PREGNANCY  . Heart murmur    AS A CHILD-ASYMPTOMATIC  . Herpes   . History of kidney stones   . Hyperlipidemia   . Hypertension     pregnancy induced  . IBS (irritable bowel syndrome)   . Obesity (BMI 30-39.9)   . Postpartum depression associated with first pregnancy     Past Surgical History:  Past Surgical History:  Procedure Laterality Date  . CESAREAN SECTION  12/21/2014   Teresa Coombs, [place]  . CESAREAN SECTION N/A 05/27/2017   Procedure: CESAREAN SECTION;  Surgeon: Nadara Mustard, MD;  Location: [facility] ORS;  Service: Obstetrics;  Laterality: N/A;  . CHOLECYSTECTOMY N/A 05/15/2018   Procedure: LAPAROSCOPIC CHOLECYSTECTOMY WITH INTRAOPERATIVE CHOLANGIOGRAM;  Surgeon: Earline Mayotte, MD;  Location: [facility] ORS;  Service: General;  Laterality: N/A;  . CYSTOSCOPY/URETEROSCOPY/HOLMIUM LASER/STENT PLACEMENT Right 02/27/2018   Procedure: CYSTOSCOPY/URETEROSCOPY/HOLMIUM LASER/STENT PLACEMENT;  Surgeon: Riki Altes, MD;  Location: [facility] ORS;  Service: Urology;  Laterality: Right;  . OTHER SURGICAL HISTORY     mole bx   . WISDOM TOOTH EXTRACTION      Family History:  Family History  Problem Relation Age of Onset  . Hypertension Mother   . Depression Mother   . Hyperlipidemia Mother   . Diverticulosis Mother   . Hearing loss Father   . Hypertension Father   . Irritable bowel syndrome Father   . Hyperlipidemia Father   . Breast cancer Maternal Grandmother   . Miscarriages / Stillbirths Maternal Grandmother   . Diabetes Maternal Grandfather   . Heart disease Maternal Grandfather        MI  . Arthritis Paternal Grandfather   . Irritable bowel syndrome Paternal Aunt   . GER disease Paternal Aunt   . Crohn's disease Paternal Aunt   . Diabetes Other     Social History:  Social History   Socioeconomic History  . Marital status: Married    Spouse name: Not on file  . Number of children: 2  . Years of education: 24  . Highest education level: Not on file  Occupational History  . Occupation: Sales executive  Social Needs  . Financial resource strain: Not on file  . Food insecurity:    Worry: Not on file     Inability: Not on file  . Transportation needs:    Medical: Not on file    Non-medical: Not on file  Tobacco Use  . Smoking status: Never Smoker  . Smokeless tobacco: Never Used  Substance and Sexual Activity  . Alcohol use: Yes    Comment: occasion  . Drug use: No  . Sexual activity: Yes    Partners: Male    Birth control/protection: Pill  Lifestyle  . Physical activity:    Days per week: Not on file    Minutes per session: Not on file  . Stress: Not on file  Relationships  . Social connections:    Talks on phone: Not on file    Gets together: Not on file    Attends religious service: Not on file    Active member of club or organization: Not on file    Attends meetings of clubs or organizations: Not on file    Relationship status: Not on file  . Intimate partner violence:    Fear of current or  ex partner: Not on file    Emotionally abused: Not on file    Physically abused: Not on file    Forced sexual activity: Not on file  Other Topics Concern  . Not on file  Social History Narrative   Married    2 Retail buyer     Allergies:  Allergies  Allergen Reactions  . Codeine Nausea Only and Nausea And Vomiting  . Latex Rash    Medications: Wellbutrin XL 150 mgm daily and  Current Outpatient Medications on File Prior to Visit  Medication Sig Dispense Refill  . norgestimate-ethinyl estradiol (ORTHO-CYCLEN,SPRINTEC,PREVIFEM) 0.25-35 MG-MCG tablet Take 1 tablet by mouth daily. 1 Package 0   No current facility-administered medications on file prior to visit.        Physical Exam Vitals: BP 120/80   Pulse 60   Ht 5' (1.524 m)   Wt 132 lb (59.9 kg)   LMP 06/15/2018 (Exact Date)   BMI 25.78 kg/m   General: WF in NAD HEENT: normocephalic, anicteric Neck: no thyroid enlargement or nodules, palpable tender LAN in anterior chain Pulmonary: No increased work of breathing, CTAB Cardiovascular: RRR, without murmur  Breast: Breast symmetrical, no  tenderness, no palpable nodules or masses, no skin or nipple retraction present, no nipple discharge.  No axillary, infraclavicular or supraclavicular lymphadenopathy. No tenderness in intercostal spaces or on the costochondral area near right sternal border Abdomen: Soft, non-tender, non-distended.  Umbilicus without lesions.  No hepatomegaly or masses palpable. No evidence of hernia. Healing scars from laparoscopic cholecystectomy noted Genitourinary:  External: Normal external female genitalia.  Normal urethral meatus, normal  Bartholin's and Skene's glands.    Vagina: Normal vaginal mucosa, no evidence of prolapse, scant blood in vault   Cervix: Grossly normal in appearance, no bleeding, non-tender  Uterus: Anteverted, dextrorotated, normal size, shape, and consistency, mobile, and non-tender  Adnexa: No adnexal masses, non-tender  Rectal: deferred  Lymphatic: no evidence of inguinal lymphadenopathy Extremities: no edema, erythema, or tenderness Neurologic: Grossly intact Psychiatric: mood appropriate, affect full  PHQ-9 score 12 with #1 and #2 questions rated a 1 and Question #9 is 0, somewhat difficult GAD-7 score is 15 with #1 question rated 3 and the rest rated 2, somewhat difficult   Assessment: 26 y.o. [id number] annual gyn exam Anxiety and depression worsened by marital stressors. Heavy menses and BTB on Orthocyclen  Plan:    1) Breast cancer screening - recommend monthly self breast exam.   2) STI screening was offered and accepted.  3) Cervical cancer screening - Pap was done.  4) Contraception - in order to decrease withdrawal flow, will try on a 24 day pill. Encouraged to try to take the same time every day. Sample of [place] given to patient with RX and instructions for taking. FU in 6-8 weeks.  5) Routine healthcare maintenance including cholesterol and diabetes screening managed by PCP. Advised to follow up with PCP regarding anxiety as well as starting counseling  with Ramond Dial. Refilled Wellbutrin XL 150 daily #30/RF x 5  Farrel Conners, CNM

## 2018-07-10 ENCOUNTER — Ambulatory Visit (INDEPENDENT_AMBULATORY_CARE_PROVIDER_SITE_OTHER): Payer: BLUE CROSS/BLUE SHIELD | Admitting: Certified Nurse Midwife

## 2018-07-10 ENCOUNTER — Other Ambulatory Visit (HOSPITAL_COMMUNITY)
Admission: RE | Admit: 2018-07-10 | Discharge: 2018-07-10 | Disposition: A | Discharge status: Home / Self Care | Payer: BLUE CROSS/BLUE SHIELD | Source: Ambulatory Visit | Attending: Certified Nurse Midwife | Admitting: Certified Nurse Midwife

## 2018-07-10 ENCOUNTER — Encounter: Payer: Self-pay | Admitting: Certified Nurse Midwife

## 2018-07-10 VITALS — BP 120/80 | HR 60 | Ht 60.0 in | Wt 132.0 lb

## 2018-07-10 DIAGNOSIS — R112 Nausea with vomiting, unspecified: Secondary | ICD-10-CM

## 2018-07-10 DIAGNOSIS — F32A Depression, unspecified: Secondary | ICD-10-CM

## 2018-07-10 DIAGNOSIS — Z01411 Encounter for gynecological examination (general) (routine) with abnormal findings: Secondary | ICD-10-CM

## 2018-07-10 DIAGNOSIS — Z124 Encounter for screening for malignant neoplasm of cervix: Secondary | ICD-10-CM

## 2018-07-10 DIAGNOSIS — F419 Anxiety disorder, unspecified: Secondary | ICD-10-CM | POA: Diagnosis not present

## 2018-07-10 DIAGNOSIS — Z01419 Encounter for gynecological examination (general) (routine) without abnormal findings: Secondary | ICD-10-CM

## 2018-07-10 DIAGNOSIS — F329 Major depressive disorder, single episode, unspecified: Secondary | ICD-10-CM | POA: Diagnosis not present

## 2018-07-10 DIAGNOSIS — R079 Chest pain, unspecified: Secondary | ICD-10-CM | POA: Diagnosis not present

## 2018-07-10 DIAGNOSIS — Z113 Encounter for screening for infections with a predominantly sexual mode of transmission: Secondary | ICD-10-CM | POA: Diagnosis not present

## 2018-07-10 DIAGNOSIS — N920 Excessive and frequent menstruation with regular cycle: Secondary | ICD-10-CM | POA: Diagnosis not present

## 2018-07-10 DIAGNOSIS — F439 Reaction to severe stress, unspecified: Secondary | ICD-10-CM | POA: Diagnosis not present

## 2018-07-10 DIAGNOSIS — R197 Diarrhea, unspecified: Secondary | ICD-10-CM | POA: Diagnosis not present

## 2018-07-10 MED ORDER — NORETHIN ACE-ETH ESTRAD-FE 1-20 MG-MCG(24) PO CAPS
1.0000 | ORAL_CAPSULE | Freq: Every day | ORAL | 3 refills | Status: DC
Start: 2018-07-10 — End: 2019-10-21

## 2018-07-10 MED ORDER — BUPROPION HCL ER (XL) 150 MG PO TB24: 150.0000 mg | ORAL_TABLET | Freq: Every day | ORAL | 5 refills | Status: DC

## 2018-07-14 LAB — CYTOLOGY - PAP
Chlamydia: NEGATIVE
Diagnosis: NEGATIVE
NEISSERIA GONORRHEA: NEGATIVE

## 2018-09-25 ENCOUNTER — Ambulatory Visit: Payer: BLUE CROSS/BLUE SHIELD | Admitting: Certified Nurse Midwife

## 2018-09-25 ENCOUNTER — Encounter

## 2018-10-21 ENCOUNTER — Telehealth: Payer: Self-pay | Admitting: Internal Medicine

## 2018-10-21 NOTE — Telephone Encounter (Signed)
Call pt is she still taking zoloft 50 mg daily?   TMS

## 2018-10-21 NOTE — Telephone Encounter (Signed)
Call pt is she still taking zoloft 50 mg daily?   TMS 

## 2018-10-23 ENCOUNTER — Other Ambulatory Visit: Payer: Self-pay | Admitting: Internal Medicine

## 2018-10-23 DIAGNOSIS — F419 Anxiety disorder, unspecified: Principal | ICD-10-CM

## 2018-10-23 DIAGNOSIS — F32A Depression, unspecified: Secondary | ICD-10-CM

## 2018-10-23 DIAGNOSIS — F329 Major depressive disorder, single episode, unspecified: Secondary | ICD-10-CM

## 2018-10-23 MED ORDER — SERTRALINE HCL 50 MG PO TABS: 50.0000 mg | ORAL_TABLET | Freq: Every day | ORAL | 1 refills | Status: DC

## 2018-10-23 NOTE — Telephone Encounter (Signed)
Left message for patient to return call back. PEC may give information.  

## 2018-10-23 NOTE — Telephone Encounter (Signed)
Patient is returning call to [facility].  She states that she is still taking Zoloft and would like a refill on the medication.  CB# [phone]

## 2018-11-23 ENCOUNTER — Ambulatory Visit: Payer: BLUE CROSS/BLUE SHIELD | Admitting: Family Medicine

## 2018-11-23 ENCOUNTER — Encounter: Payer: Self-pay | Admitting: Emergency Medicine

## 2018-11-23 ENCOUNTER — Encounter: Payer: Self-pay | Admitting: Family Medicine

## 2018-11-23 VITALS — BP 130/80 | HR 87 | Temp 98.8°F | Ht 60.0 in | Wt 140.8 lb

## 2018-11-23 DIAGNOSIS — R110 Nausea: Secondary | ICD-10-CM | POA: Diagnosis not present

## 2018-11-23 DIAGNOSIS — R51 Headache: Secondary | ICD-10-CM | POA: Diagnosis not present

## 2018-11-23 DIAGNOSIS — R6883 Chills (without fever): Secondary | ICD-10-CM | POA: Diagnosis not present

## 2018-11-23 DIAGNOSIS — R112 Nausea with vomiting, unspecified: Secondary | ICD-10-CM | POA: Diagnosis not present

## 2018-11-23 DIAGNOSIS — M791 Myalgia, unspecified site: Secondary | ICD-10-CM

## 2018-11-23 DIAGNOSIS — R519 Headache, unspecified: Secondary | ICD-10-CM

## 2018-11-23 LAB — POCT INFLUENZA A/B
Influenza A, POC: NEGATIVE
Influenza B, POC: NEGATIVE

## 2018-11-23 MED ORDER — ONDANSETRON 8 MG PO TBDP: 8.0000 mg | ORAL_TABLET | Freq: Three times a day (TID) | ORAL | 0 refills | Status: DC | PRN

## 2018-11-23 MED ORDER — PROMETHAZINE HCL 25 MG/ML IJ SOLN
25.0000 mg | Freq: Once | INTRAMUSCULAR | Status: AC
  Administered 2018-11-23: 25 mg via INTRAMUSCULAR

## 2018-11-23 MED ORDER — KETOROLAC TROMETHAMINE 60 MG/2ML IM SOLN
60.0000 mg | Freq: Once | INTRAMUSCULAR | Status: AC
  Administered 2018-11-23: 60 mg via INTRAMUSCULAR

## 2018-11-23 NOTE — Patient Instructions (Signed)
Good to see you today  You can take acetaminophen (Tylenol) today for headache, do not take any NSAID for 12 hours after getting Toradol shot, then can take ibuprofen 2-3 tablets every 8-12 hours as needed  Rest, increase fluids   Viral Illness, Adult Viruses are tiny germs that can get into a person's body and cause illness. There are many different types of viruses, and they cause many types of illness. Viral illnesses can range from mild to severe. They can affect various parts of the body. Common illnesses that are caused by a virus include colds and the flu. Viral illnesses also include serious conditions such as HIV/AIDS (human immunodeficiency virus/acquired immunodeficiency syndrome). A few viruses have been linked to certain cancers. What are the causes? Many types of viruses can cause illness. Viruses invade cells in your body, multiply, and cause the infected cells to malfunction or die. When the cell dies, it releases more of the virus. When this happens, you develop symptoms of the illness, and the virus continues to spread to other cells. If the virus takes over the function of the cell, it can cause the cell to divide and grow out of control, as is the case when a virus causes cancer. Different viruses get into the body in different ways. You can get a virus by:  Swallowing food or water that is contaminated with the virus.  Breathing in droplets that have been coughed or sneezed into the air by an infected person.  Touching a surface that has been contaminated with the virus and then touching your eyes, nose, or mouth.  Being bitten by an insect or animal that carries the virus.  Having sexual contact with a person who is infected with the virus.  Being exposed to blood or fluids that contain the virus, either through an open cut or during a transfusion.  If a virus enters your body, your body's defense system (immune system) will try to fight the virus. You may be at higher  risk for a viral illness if your immune system is weak. What are the signs or symptoms? Symptoms vary depending on the type of virus and the location of the cells that it invades. Common symptoms of the main types of viral illnesses include: Cold and flu viruses  Fever.  Headache.  Sore throat.  Muscle aches.  Nasal congestion.  Cough. Digestive system (gastrointestinal) viruses  Fever.  Abdominal pain.  Nausea.  Diarrhea. Liver viruses (hepatitis)  Loss of appetite.  Tiredness.  Yellowing of the skin (jaundice). Brain and spinal cord viruses  Fever.  Headache.  Stiff neck.  Nausea and vomiting.  Confusion or sleepiness. Skin viruses  Warts.  Itching.  Rash. Sexually transmitted viruses  Discharge.  Swelling.  Redness.  Rash. How is this treated? Viruses can be difficult to treat because they live within cells. Antibiotic medicines do not treat viruses because these drugs do not get inside cells. Treatment for a viral illness may include:  Resting and drinking plenty of fluids.  Medicines to relieve symptoms. These can include over-the-counter medicine for pain and fever, medicines for cough or congestion, and medicines to relieve diarrhea.  Antiviral medicines. These drugs are available only for certain types of viruses. They may help reduce flu symptoms if taken early. There are also many antiviral medicines for hepatitis and HIV/AIDS.  Some viral illnesses can be prevented with vaccinations. A common example is the flu shot. Follow these instructions at home: Medicines   Take over-the-counter and  prescription medicines only as told by your health care provider.  If you were prescribed an antiviral medicine, take it as told by your health care provider. Do not stop taking the medicine even if you start to feel better.  Be aware of when antibiotics are needed and when they are not needed. Antibiotics do not treat viruses. If your health  care provider thinks that you may have a bacterial infection as well as a viral infection, you may get an antibiotic. ? Do not ask for an antibiotic prescription if you have been diagnosed with a viral illness. That will not make your illness go away faster. ? Frequently taking antibiotics when they are not needed can lead to antibiotic resistance. When this develops, the medicine no longer works against the bacteria that it normally fights. General instructions  Drink enough fluids to keep your urine clear or pale yellow.  Rest as much as possible.  Return to your normal activities as told by your health care provider. Ask your health care provider what activities are safe for you.  Keep all follow-up visits as told by your health care provider. This is important. How is this prevented? Take these actions to reduce your risk of viral infection:  Eat a healthy diet and get enough rest.  Wash your hands often with soap and water. This is especially important when you are in public places. If soap and water are not available, use hand sanitizer.  Avoid close contact with friends and family who have a viral illness.  If you travel to areas where viral gastrointestinal infection is common, avoid drinking water or eating raw food.  Keep your immunizations up to date. Get a flu shot every year as told by your health care provider.  Do not share toothbrushes, nail clippers, razors, or needles with other people.  Always practice safe sex.  Contact a health care provider if:  You have symptoms of a viral illness that do not go away.  Your symptoms come back after going away.  Your symptoms get worse. Get help right away if:  You have trouble breathing.  You have a severe headache or a stiff neck.  You have severe vomiting or abdominal pain. This information is not intended to replace advice given to you by your health care provider. Make sure you discuss any questions you have with  your health care provider. Document Released: 04/19/2016 Document Revised: 05/22/2016 Document Reviewed: 04/19/2016 Elsevier Interactive Patient Education  [employer]2018 Elsevier Inc.

## 2018-11-23 NOTE — Progress Notes (Signed)
Subjective:    Patient ID: Megan Kramer, female    DOB: [birth year], 27 y.o.   MRN: [id number]  HPI This is a 27 yo female, accompanied by her husband, who presents today with headache and nausea that woke her from sleep at 4 am. Took some Excedrin without relief. Has had one other headache like this in past while pregnant. Eyes feel blurry, worse with glasses. Sound and light sensitivity. Vomiting x 1. Diarrhea x 2. Achy all over. Feels hot/cold. No flu shot. Currently menstruating. No cough.   Past Medical History:  Diagnosis Date  . Anxiety   . Asthma    EXERCISE INDUCED-NO INHALER NOW  . Chronic cholecystitis without calculus   . Depression   . Dysrhythmia    TACHYCARDIA WITH 1ST PREGNANCY-ECHO DONE AND WAS WNL-ASYMPTOMATIC NOW  . Family history of adverse reaction to anesthesia    MOM- N/V  . GERD (gastroesophageal reflux disease)    H/O WITH PREGNANCY  . Heart murmur    AS A CHILD-ASYMPTOMATIC  . Herpes   . History of kidney stones   . Hyperlipidemia   . Hypertension    pregnancy induced  . IBS (irritable bowel syndrome)   . Obesity (BMI 30-39.9)   . Postpartum depression associated with first pregnancy    Past Surgical History:  Procedure Laterality Date  . CESAREAN SECTION  12/21/2014   Teresa Coombsuter Banks, [place]  . CESAREAN SECTION N/A 05/27/2017   Procedure: CESAREAN SECTION;  Surgeon: Nadara MustardHarris, Robert P, MD;  Location: [facility];  Service: Obstetrics;  Laterality: N/A;  . CHOLECYSTECTOMY N/A 05/15/2018   Procedure: LAPAROSCOPIC CHOLECYSTECTOMY WITH INTRAOPERATIVE CHOLANGIOGRAM;  Surgeon: Earline MayotteByrnett, Jeffrey W, MD;  Location: [facility];  Service: General;  Laterality: N/A;  . CYSTOSCOPY/URETEROSCOPY/HOLMIUM LASER/STENT PLACEMENT Right 02/27/2018   Procedure: CYSTOSCOPY/URETEROSCOPY/HOLMIUM LASER/STENT PLACEMENT;  Surgeon: Riki AltesStoioff, Scott C, MD;  Location: [facility];  Service: Urology;  Laterality: Right;  . OTHER SURGICAL HISTORY     mole bx   . WISDOM TOOTH EXTRACTION     Family  History  Problem Relation Age of Onset  . Hypertension Mother   . Depression Mother   . Hyperlipidemia Mother   . Diverticulosis Mother   . Hearing loss Father   . Hypertension Father   . Irritable bowel syndrome Father   . Hyperlipidemia Father   . Breast cancer Maternal Grandmother   . Miscarriages / Stillbirths Maternal Grandmother   . Diabetes Maternal Grandfather   . Heart disease Maternal Grandfather        MI  . Arthritis Paternal Grandfather   . Irritable bowel syndrome Paternal Aunt   . GER disease Paternal Aunt   . Crohn's disease Paternal Aunt   . Diabetes Other    Social History   Tobacco Use  . Smoking status: Never Smoker  . Smokeless tobacco: Never Used  Substance Use Topics  . Alcohol use: Yes    Comment: occasion  . Drug use: No      Review of Systems Per HPI    Objective:   Physical Exam  Constitutional: She is oriented to person, place, and time. She appears well-developed and well-nourished. She appears ill. No distress.  HENT:  Head: Normocephalic and atraumatic.  Mouth/Throat: Oropharynx is clear and moist.  Eyes: Pupils are equal, round, and reactive to light. Conjunctivae and EOM are normal.  Neck: Normal range of motion. Neck supple.  Cardiovascular: Normal rate, regular rhythm and normal heart sounds.  Pulmonary/Chest: Effort normal and breath sounds normal.  Neurological: She is alert and oriented to person, place, and time. She displays normal reflexes. No cranial nerve deficit. She exhibits normal muscle tone.  Skin: Skin is warm and dry. She is not diaphoretic.  Psychiatric: She has a normal mood and affect. Her behavior is normal. Judgment and thought content normal.  Vitals reviewed.     BP 130/80   Pulse 87   Temp 98.8 F (37.1 C) (Oral)   Ht 5' (1.524 m)   BMI 25.78 kg/m  Meds ordered this encounter  Medications  . ketorolac (TORADOL) injection 60 mg  . promethazine (PHENERGAN) injection 25 mg      Assessment &  Plan:  1. Myalgia - POCT Influenza A/B- negative - suspect viral illness - Provided written and verbal information regarding diagnosis and treatment. - RTC precautions reviewed  2. Acute nonintractable headache, unspecified headache type - POCT Influenza A/B - ketorolac (TORADOL) injection 60 mg  3. Chills - POCT Influenza A/B  4. Non-intractable vomiting with nausea, unspecified vomiting type - promethazine (PHENERGAN) injection 25 mg   Olean Ree, FNP-BC  [facility], [facility]  11/23/2018 11:42 AM

## 2019-04-28 ENCOUNTER — Telehealth: Payer: Self-pay | Admitting: Internal Medicine

## 2019-04-28 NOTE — Telephone Encounter (Signed)
I would rec psychiatry does she want referral?   TMS

## 2019-04-28 NOTE — Telephone Encounter (Signed)
Copied from CRM [id number]. Topic: Quick Communication - See Telephone Encounter >> Apr 28, 2019  2:21 PM Terisa Starr wrote: CRM for notification. See Telephone encounter for: 04/28/19.  Patient states she is leaving her husband and she said her nerves are shot. She said she takes Zoloft daily & that it is not calming her down.She is in counseling again. She had her first visit yesterday.

## 2019-04-29 NOTE — Telephone Encounter (Signed)
Unable to leave message for patient to return call back. PEC may give and obtain information.  

## 2019-05-25 ENCOUNTER — Encounter: Payer: Self-pay | Admitting: Internal Medicine

## 2019-06-15 ENCOUNTER — Encounter: Payer: Self-pay | Admitting: Obstetrics and Gynecology

## 2019-06-15 ENCOUNTER — Other Ambulatory Visit: Payer: Self-pay | Admitting: Obstetrics and Gynecology

## 2019-06-15 ENCOUNTER — Ambulatory Visit (INDEPENDENT_AMBULATORY_CARE_PROVIDER_SITE_OTHER): Payer: BC Managed Care – PPO | Admitting: Obstetrics and Gynecology

## 2019-06-15 ENCOUNTER — Other Ambulatory Visit (HOSPITAL_COMMUNITY)
Admission: RE | Admit: 2019-06-15 | Discharge: 2019-06-15 | Disposition: A | Discharge status: Home / Self Care | Payer: BC Managed Care – PPO | Source: Ambulatory Visit | Attending: Obstetrics and Gynecology | Admitting: Obstetrics and Gynecology

## 2019-06-15 ENCOUNTER — Other Ambulatory Visit: Payer: Self-pay

## 2019-06-15 ENCOUNTER — Ambulatory Visit (INDEPENDENT_AMBULATORY_CARE_PROVIDER_SITE_OTHER): Payer: BC Managed Care – PPO

## 2019-06-15 VITALS — BP 120/76 | HR 67 | Temp 98.2°F | Ht 60.0 in | Wt 134.0 lb

## 2019-06-15 DIAGNOSIS — R102 Pelvic and perineal pain: Secondary | ICD-10-CM | POA: Diagnosis not present

## 2019-06-15 DIAGNOSIS — Z3201 Encounter for pregnancy test, result positive: Secondary | ICD-10-CM | POA: Diagnosis not present

## 2019-06-15 DIAGNOSIS — N730 Acute parametritis and pelvic cellulitis: Secondary | ICD-10-CM

## 2019-06-15 DIAGNOSIS — N926 Irregular menstruation, unspecified: Secondary | ICD-10-CM | POA: Diagnosis not present

## 2019-06-15 DIAGNOSIS — N8312 Corpus luteum cyst of left ovary: Secondary | ICD-10-CM

## 2019-06-15 DIAGNOSIS — Z3491 Encounter for supervision of normal pregnancy, unspecified, first trimester: Secondary | ICD-10-CM

## 2019-06-15 DIAGNOSIS — O3481 Maternal care for other abnormalities of pelvic organs, first trimester: Secondary | ICD-10-CM | POA: Diagnosis not present

## 2019-06-15 DIAGNOSIS — Z3A01 Less than 8 weeks gestation of pregnancy: Secondary | ICD-10-CM

## 2019-06-15 DIAGNOSIS — Z113 Encounter for screening for infections with a predominantly sexual mode of transmission: Secondary | ICD-10-CM

## 2019-06-15 DIAGNOSIS — R300 Dysuria: Secondary | ICD-10-CM | POA: Diagnosis not present

## 2019-06-15 LAB — POCT URINALYSIS DIPSTICK
Appearance: NORMAL
Bilirubin, UA: NEGATIVE
Blood, UA: NEGATIVE
Glucose, UA: NEGATIVE
Ketones, UA: NEGATIVE
Leukocytes, UA: NEGATIVE
Nitrite, UA: NEGATIVE
Odor: NORMAL
Protein, UA: NEGATIVE
Spec Grav, UA: 1.025 (ref 1.010–1.025)
Urobilinogen, UA: 0.2 E.U./dL
pH, UA: 6 (ref 5.0–8.0)

## 2019-06-15 LAB — POCT URINE PREGNANCY: Preg Test, Ur: POSITIVE — AB

## 2019-06-15 MED ORDER — CEFTRIAXONE SODIUM 250 MG IJ SOLR
250.0000 mg | Freq: Once | INTRAMUSCULAR | Status: AC
  Administered 2019-06-15: 250 mg via INTRAMUSCULAR

## 2019-06-15 MED ORDER — AZITHROMYCIN 500 MG PO TABS: ORAL_TABLET | ORAL | 0 refills | Status: DC

## 2019-06-15 NOTE — Patient Instructions (Signed)
I value your feedback and entrusting us with your care. If you get a [facility] patient survey, I would appreciate you taking the time to let us know about your experience today. Thank you! 

## 2019-06-15 NOTE — Progress Notes (Signed)
Patient presents for Rocephin 250mg  injection. Given IM RUOQ. Patient tolerated well.

## 2019-06-15 NOTE — Progress Notes (Signed)
McLean-Scocuzza, Pasty Spillersracy N, MD   Chief Complaint  Patient presents with  . Gynecologic Exam    lower abdominal pain, n/v, dysuria, LBP x 3 days    HPI:      [name] is a 28 y.o. [id number] who LMP was Patient's last menstrual period was 05/14/2019 (approximate)., presents today for nausea/vomiting for several days with achy pelvic pain and LBP. Pt has been chilled and just feels really sick. No vag bleeding/spotting. She has dysuria without frequency/hematuria/fevers. No vag sx. Has loose stools which is normal for her after lap chole. Pt was sex active a month ago, on OCPs. Had only dark spotting and cramping with LMP, but it was unusual for her. She takes OCPs but has missed some pills.  Hx of C/S 2 yrs ago and was told uterus was inflamed and could've ruptured. Future pregnancy not encouraged.     Past Medical History:  Diagnosis Date  . Anxiety   . Asthma    EXERCISE INDUCED-NO INHALER NOW  . Chronic cholecystitis without calculus   . Depression   . Dysrhythmia    TACHYCARDIA WITH 1ST PREGNANCY-ECHO DONE AND WAS WNL-ASYMPTOMATIC NOW  . Family history of adverse reaction to anesthesia    MOM- N/V  . GERD (gastroesophageal reflux disease)    H/O WITH PREGNANCY  . Heart murmur    AS A CHILD-ASYMPTOMATIC  . Herpes   . History of kidney stones   . Hyperlipidemia   . Hypertension    pregnancy induced  . IBS (irritable bowel syndrome)   . Obesity (BMI 30-39.9)   . Postpartum depression associated with first pregnancy     Past Surgical History:  Procedure Laterality Date  . CESAREAN SECTION  12/21/2014   Teresa Coombsuter Banks, [place]  . CESAREAN SECTION N/A 05/27/2017   Procedure: CESAREAN SECTION;  Surgeon: Nadara MustardHarris, Robert P, MD;  Location: [facility];  Service: Obstetrics;  Laterality: N/A;  . CHOLECYSTECTOMY N/A 05/15/2018   Procedure: LAPAROSCOPIC CHOLECYSTECTOMY WITH INTRAOPERATIVE CHOLANGIOGRAM;  Surgeon: Earline MayotteByrnett, Jeffrey W, MD;  Location: [facility];  Service: General;   Laterality: N/A;  . CYSTOSCOPY/URETEROSCOPY/HOLMIUM LASER/STENT PLACEMENT Right 02/27/2018   Procedure: CYSTOSCOPY/URETEROSCOPY/HOLMIUM LASER/STENT PLACEMENT;  Surgeon: Riki AltesStoioff, Scott C, MD;  Location: [facility];  Service: Urology;  Laterality: Right;  . OTHER SURGICAL HISTORY     mole bx   . WISDOM TOOTH EXTRACTION      Family History  Problem Relation Age of Onset  . Hypertension Mother   . Depression Mother   . Hyperlipidemia Mother   . Diverticulosis Mother   . Hearing loss Father   . Hypertension Father   . Irritable bowel syndrome Father   . Hyperlipidemia Father   . Breast cancer Maternal Grandmother   . Miscarriages / Stillbirths Maternal Grandmother   . Diabetes Maternal Grandfather   . Heart disease Maternal Grandfather        MI  . Arthritis Paternal Grandfather   . Irritable bowel syndrome Paternal Aunt   . GER disease Paternal Aunt   . Crohn's disease Paternal Aunt   . Diabetes Other     Social History   Socioeconomic History  . Marital status: Married    Spouse name: Not on file  . Number of children: 2  . Years of education: 6213  . Highest education level: Not on file  Occupational History  . Occupation: Sales executivedental assistant  Social Needs  . Financial resource strain: Not on file  . Food insecurity  Worry: Not on file    Inability: Not on file  . Transportation needs    Medical: Not on file    Non-medical: Not on file  Tobacco Use  . Smoking status: Never Smoker  . Smokeless tobacco: Never Used  Substance and Sexual Activity  . Alcohol use: Yes    Comment: occasion  . Drug use: No  . Sexual activity: Yes    Partners: Male    Birth control/protection: Pill  Lifestyle  . Physical activity    Days per week: Not on file    Minutes per session: Not on file  . Stress: Not on file  Relationships  . Social Herbalist on phone: Not on file    Gets together: Not on file    Attends religious service: Not on file    Active member of club or  organization: Not on file    Attends meetings of clubs or organizations: Not on file    Relationship status: Not on file  . Intimate partner violence    Fear of current or ex partner: Not on file    Emotionally abused: Not on file    Physically abused: Not on file    Forced sexual activity: Not on file  Other Topics Concern  . Not on file  Social History Narrative   Married    2 kids    Dental assistant     Outpatient Medications Prior to Visit  Medication Sig Dispense Refill  . Norethin Ace-Eth Estrad-FE (TAYTULLA) 1-20 MG-MCG(24) CAPS Take 1 tablet by mouth daily. 84 capsule 3  . sertraline (ZOLOFT) 50 MG tablet Take 1 tablet (50 mg total) by mouth daily. 90 tablet 1  . norgestimate-ethinyl estradiol (ORTHO-CYCLEN,SPRINTEC,PREVIFEM) 0.25-35 MG-MCG tablet Take 1 tablet by mouth daily. (Patient not taking: Reported on 11/23/2018) 1 Package 0  . ondansetron (ZOFRAN-ODT) 8 MG disintegrating tablet Take 1 tablet (8 mg total) by mouth every 8 (eight) hours as needed for nausea. 12 tablet 0   No facility-administered medications prior to visit.       ROS:  Review of Systems  Constitutional: Negative for fatigue, fever and unexpected weight change.  Respiratory: Negative for cough, shortness of breath and wheezing.   Cardiovascular: Negative for chest pain, palpitations and leg swelling.  Gastrointestinal: Positive for nausea and vomiting. Negative for blood in stool, constipation and diarrhea.  Endocrine: Negative for cold intolerance, heat intolerance and polyuria.  Genitourinary: Positive for dyspareunia and dysuria. Negative for flank pain, frequency, genital sores, hematuria, menstrual problem, pelvic pain, urgency, vaginal bleeding, vaginal discharge and vaginal pain.  Musculoskeletal: Positive for back pain. Negative for joint swelling and myalgias.  Skin: Negative for rash.  Neurological: Positive for headaches. Negative for dizziness, syncope, light-headedness and numbness.   Hematological: Negative for adenopathy.  Psychiatric/Behavioral: Negative for agitation, confusion, sleep disturbance and suicidal ideas. The patient is not nervous/anxious.   BREAST: tenderness   OBJECTIVE:   Vitals:  BP 120/76 (BP Location: Left Arm, Patient Position: Sitting, Cuff Size: Normal)   Pulse 67   Temp 98.2 F (36.8 C)   Ht 5' (1.524 m)   Wt 134 lb (60.8 kg)   LMP 05/14/2019 (Approximate)   BMI 26.17 kg/m   Physical Exam Vitals signs reviewed.  Constitutional:      Appearance: She is well-developed.  Neck:     Musculoskeletal: Normal range of motion.  Pulmonary:     Effort: Pulmonary effort is normal.  Abdominal:  Palpations: Abdomen is soft.     Tenderness: There is abdominal tenderness in the right lower quadrant, suprapubic area and left lower quadrant. There is no guarding or rebound.  Genitourinary:    General: Normal vulva.     Pubic Area: No rash.      Labia:        Right: No rash, tenderness or lesion.        Left: No rash, tenderness or lesion.      Vagina: Normal. No vaginal discharge, erythema, tenderness or bleeding.     Cervix: Cervical motion tenderness present.     Uterus: Normal. Tender. Not enlarged.      Adnexa:        Right: Tenderness present. No mass.         Left: Tenderness present. No mass.    Musculoskeletal: Normal range of motion.  Skin:    General: Skin is warm and dry.  Neurological:     General: No focal deficit present.     Mental Status: She is alert and oriented to person, place, and time.  Psychiatric:        Mood and Affect: Mood normal.        Behavior: Behavior normal.        Thought Content: Thought content normal.        Judgment: Judgment normal.     Results: Results for orders placed or performed in visit on 06/15/19 (from the past 24 hour(s))  POCT urine pregnancy     Status: Abnormal   Collection Time: 06/15/19 10:41 AM  Result Value Ref Range   Preg Test, Ur Positive (A) Negative  POCT Urinalysis  Dipstick     Status: Normal   Collection Time: 06/15/19 10:42 AM  Result Value Ref Range   Color, UA yellow    Clarity, UA clear    Glucose, UA Negative Negative   Bilirubin, UA neg    Ketones, UA neg    Spec Grav, UA 1.025 1.010 - 1.025   Blood, UA neg    pH, UA 6.0 5.0 - 8.0   Protein, UA Negative Negative   Urobilinogen, UA 0.2 0.2 or 1.0 E.U./dL   Nitrite, UA neg    Leukocytes, UA Negative Negative   Appearance normal    Odor normal    ULTRASOUND REPORT  Location: [facility] Date of Service: 06/15/2019   Indications:dating, pelvic pain in early pregnancy Findings:  Singleton intrauterine pregnancy is visualized with a CRL consistent with 34656w5d gestation, giving an (U/S) EDD of 02/10/2020. FHR: 102 BPM CRL measurement: 2.4 mm Yolk sac is visualized and appears normal and early anatomy is normal. Amnion: not visualized   Normal uterus and cervix. Right Ovary is normal in appearance. Left Ovary is normal appearance. Corpus luteal cyst:  Left ovary Survey of the adnexa demonstrates no adnexal masses. There is no free peritoneal fluid in the cul de sac.  Impression: 1. 49656w5d Viable Singleton Intrauterine pregnancy by U/S.  Recommendations: 1.Clinical correlation with the patient's History and Physical Exam.  Deanna ArtisElyse S Fairbanks, RT  Assessment/Plan: Pelvic pain - Plan: US PELVIS TRANSVANGINAL NON-OB (TV ONLY), Cervicovaginal ancillary only, cefTRIAXone (ROCEPHIN) injection 250 mg, Presumed PID after normal GYN u/s showing normal pregnancy/sx hx/ and PE findings. Rocephin IM today and azithro once wkly for 2 wks. Will hold off on flagyl currently due to pregnancy. Check STD testing. RTO in 1 wk for sx f/u.  PID (acute pelvic inflammatory disease) - Plan: cefTRIAXone (ROCEPHIN) injection 250  mg, azithromycin (ZITHROMAX) 500 MG tablet,   Screening for STD (sexually transmitted disease) - Plan: Cervicovaginal ancillary only,   Positive pregnancy test - Plan: Beta  hCG quant (ref lab), US PELVIS TRANSVANGINAL NON-OB (TV ONLY), normal pregnancy on GYN u/s. 5474w5d. Pt doesn't want to continue pregnancy. [facility] f[facility].  Irregular menses - Plan: POCT urine pregnancy,   Dysuria - Plan: POCT Urinalysis Dipstick, UA neg.     Meds ordered this encounter  Medications  . cefTRIAXone (ROCEPHIN) injection 250 mg  . azithromycin (ZITHROMAX) 500 MG tablet    Sig: Take 1 g (2 tabs) once a wk for 2 wks    Dispense:  4 tablet    Refill:  0    Order Specific Question:   Supervising Provider    Answer:   Nadara MustardHARRIS, ROBERT P [[id number]]      Return in about 1 week (around 06/22/2019) for pelvic f/u.  Lorilynn Lehr B. Tejal Monroy, PA-C 06/15/2019 2:04 PM

## 2019-06-16 LAB — BETA HCG QUANT (REF LAB): hCG Quant: 31310 m[IU]/mL

## 2019-06-17 LAB — CERVICOVAGINAL ANCILLARY ONLY
Chlamydia: NEGATIVE
Neisseria Gonorrhea: NEGATIVE

## 2019-06-21 ENCOUNTER — Ambulatory Visit: Payer: Self-pay

## 2019-06-22 ENCOUNTER — Encounter: Payer: Self-pay | Admitting: Obstetrics and Gynecology

## 2019-06-22 ENCOUNTER — Ambulatory Visit (INDEPENDENT_AMBULATORY_CARE_PROVIDER_SITE_OTHER): Payer: BC Managed Care – PPO | Admitting: Obstetrics and Gynecology

## 2019-06-22 ENCOUNTER — Other Ambulatory Visit: Payer: Self-pay

## 2019-06-22 VITALS — BP 120/80 | Ht 60.0 in | Wt 135.6 lb

## 2019-06-22 DIAGNOSIS — B3731 Acute candidiasis of vulva and vagina: Secondary | ICD-10-CM

## 2019-06-22 DIAGNOSIS — B373 Candidiasis of vulva and vagina: Secondary | ICD-10-CM | POA: Diagnosis not present

## 2019-06-22 DIAGNOSIS — R102 Pelvic and perineal pain: Secondary | ICD-10-CM | POA: Diagnosis not present

## 2019-06-22 DIAGNOSIS — Z30017 Encounter for initial prescription of implantable subdermal contraceptive: Secondary | ICD-10-CM

## 2019-06-22 MED ORDER — FLUCONAZOLE 150 MG PO TABS: 150.0000 mg | ORAL_TABLET | Freq: Once | ORAL | 0 refills | Status: AC

## 2019-06-22 MED ORDER — NEXPLANON 68 MG ~~LOC~~ IMPL: 1.0000 | DRUG_IMPLANT | Freq: Once | SUBCUTANEOUS | 0 refills | Status: DC

## 2019-06-22 NOTE — Progress Notes (Signed)
McLean-Scocuzza, Megan Glow, MD   Chief Complaint  Patient presents with  . Follow-up    Pt had an abortion this past sat  . Contraception    interested in nexplanon    HPI:      [name] is a 28 y.o. [id number] who LMP was No LMP recorded. (Menstrual status: Oral contraceptives)., presents today for presumed PID f/u from 06/15/19. Pt given rocephin, started on azithro 1 g weekly for 2 wks. Pt was [redacted] wks pregnant at time and had termination via [facility] 06/19/19. She states all her pelvic pain resolved within hrs of the procedure. She is upset by the termination but feels much better. Neg STD test 6/23.  Pt is starting to have yeast vag sx due to abx. Also put on amox at termination. Has not used any meds. She is interested in nexplanon. Currently not sex active but has gotten pregnant twice in past on OCPs due to forgetting to take them daily. She wants a more reliable BC for her.  Due for annual 7/20.   Past Medical History:  Diagnosis Date  . Anxiety   . Asthma    EXERCISE INDUCED-NO INHALER NOW  . Chronic cholecystitis without calculus   . Depression   . Dysrhythmia    TACHYCARDIA WITH 1ST PREGNANCY-ECHO DONE AND WAS WNL-ASYMPTOMATIC NOW  . Family history of adverse reaction to anesthesia    MOM- N/V  . GERD (gastroesophageal reflux disease)    H/O WITH PREGNANCY  . Heart murmur    AS A CHILD-ASYMPTOMATIC  . Herpes   . History of kidney stones   . Hyperlipidemia   . Hypertension    pregnancy induced  . IBS (irritable bowel syndrome)   . Obesity (BMI 30-39.9)   . Postpartum depression associated with first pregnancy     Past Surgical History:  Procedure Laterality Date  . CESAREAN SECTION  12/21/2014   Franchot [place], [place]  . CESAREAN SECTION N/A 05/27/2017   Procedure: CESAREAN SECTION;  Surgeon: Gae Dry, MD;  Location: [facility] ORS;  Service: Obstetrics;  Laterality: N/A;  . CHOLECYSTECTOMY N/A 05/15/2018   Procedure: LAPAROSCOPIC CHOLECYSTECTOMY WITH  INTRAOPERATIVE CHOLANGIOGRAM;  Surgeon: Robert Bellow, MD;  Location: [facility] ORS;  Service: General;  Laterality: N/A;  . CYSTOSCOPY/URETEROSCOPY/HOLMIUM LASER/STENT PLACEMENT Right 02/27/2018   Procedure: CYSTOSCOPY/URETEROSCOPY/HOLMIUM LASER/STENT PLACEMENT;  Surgeon: Abbie Sons, MD;  Location: [facility] ORS;  Service: Urology;  Laterality: Right;  . OTHER SURGICAL HISTORY     mole bx   . WISDOM TOOTH EXTRACTION      Family History  Problem Relation Age of Onset  . Hypertension Mother   . Depression Mother   . Hyperlipidemia Mother   . Diverticulosis Mother   . Hearing loss Father   . Hypertension Father   . Irritable bowel syndrome Father   . Hyperlipidemia Father   . Breast cancer Maternal Grandmother   . Miscarriages / Stillbirths Maternal Grandmother   . Diabetes Maternal Grandfather   . Heart disease Maternal Grandfather        MI  . Arthritis Paternal Grandfather   . Irritable bowel syndrome Paternal Aunt   . GER disease Paternal Aunt   . Crohn's disease Paternal Aunt   . Diabetes Other     Social History   Socioeconomic History  . Marital status: Married    Spouse name: Not on file  . Number of children: 2  . Years of education: 87  . Highest education level:  Not on file  Occupational History  . Occupation: Sales executivedental assistant  Social Needs  . Financial resource strain: Not on file  . Food insecurity    Worry: Not on file    Inability: Not on file  . Transportation needs    Medical: Not on file    Non-medical: Not on file  Tobacco Use  . Smoking status: Never Smoker  . Smokeless tobacco: Never Used  Substance and Sexual Activity  . Alcohol use: Yes    Comment: occasion  . Drug use: No  . Sexual activity: Yes    Partners: Male    Birth control/protection: Pill  Lifestyle  . Physical activity    Days per week: Not on file    Minutes per session: Not on file  . Stress: Not on file  Relationships  . Social Musicianconnections    Talks on phone: Not on file     Gets together: Not on file    Attends religious service: Not on file    Active member of club or organization: Not on file    Attends meetings of clubs or organizations: Not on file    Relationship status: Not on file  . Intimate partner violence    Fear of current or ex partner: Not on file    Emotionally abused: Not on file    Physically abused: Not on file    Forced sexual activity: Not on file  Other Topics Concern  . Not on file  Social History Narrative   Married    2 kids    Dental assistant     Outpatient Medications Prior to Visit  Medication Sig Dispense Refill  . etonogestrel (NEXPLANON) 68 MG IMPL implant 1 each by Subdermal route once.    Marland Kitchen. azithromycin (ZITHROMAX) 500 MG tablet Take 1 g (2 tabs) once a wk for 2 wks 4 tablet 0  . Norethin Ace-Eth Estrad-FE (TAYTULLA) 1-20 MG-MCG(24) CAPS Take 1 tablet by mouth daily. 84 capsule 3  . norgestimate-ethinyl estradiol (ORTHO-CYCLEN,SPRINTEC,PREVIFEM) 0.25-35 MG-MCG tablet Take 1 tablet by mouth daily. (Patient not taking: Reported on 11/23/2018) 1 Package 0  . ondansetron (ZOFRAN-ODT) 8 MG disintegrating tablet Take 1 tablet (8 mg total) by mouth every 8 (eight) hours as needed for nausea. 12 tablet 0  . sertraline (ZOLOFT) 50 MG tablet Take 1 tablet (50 mg total) by mouth daily. 90 tablet 1   No facility-administered medications prior to visit.       ROS:  Review of Systems  Constitutional: Negative for fatigue, fever and unexpected weight change.  Respiratory: Negative for cough, shortness of breath and wheezing.   Cardiovascular: Negative for chest pain, palpitations and leg swelling.  Gastrointestinal: Negative for blood in stool, constipation, diarrhea, nausea and vomiting.  Endocrine: Negative for cold intolerance, heat intolerance and polyuria.  Genitourinary: Negative for dyspareunia, dysuria, flank pain, frequency, genital sores, hematuria, menstrual problem, pelvic pain, urgency, vaginal bleeding, vaginal  discharge and vaginal pain.  Musculoskeletal: Negative for back pain, joint swelling and myalgias.  Skin: Negative for rash.  Neurological: Negative for dizziness, syncope, light-headedness, numbness and headaches.  Hematological: Negative for adenopathy.  Psychiatric/Behavioral: Positive for agitation. Negative for confusion, sleep disturbance and suicidal ideas. The patient is not nervous/anxious.     OBJECTIVE:   Vitals:  BP 120/80   Ht 5' (1.524 m)   Wt 135 lb 9.6 oz (61.5 kg)   BMI 26.48 kg/m   Physical Exam Vitals signs reviewed.  Constitutional:  Appearance: She is well-developed.  Neck:     Musculoskeletal: Normal range of motion.  Pulmonary:     Effort: Pulmonary effort is normal.  Abdominal:     Palpations: Abdomen is soft.     Tenderness: There is no abdominal tenderness.  Musculoskeletal: Normal range of motion.  Skin:    General: Skin is warm and dry.  Neurological:     General: No focal deficit present.     Mental Status: She is alert and oriented to person, place, and time.     Cranial Nerves: No cranial nerve deficit.  Psychiatric:        Mood and Affect: Mood normal.        Behavior: Behavior normal.        Thought Content: Thought content normal.        Judgment: Judgment normal.     Assessment/Plan: Pelvic pain - Plan: Sx resolved after EAB. Question pregnancy vs abx use. Neg abd exam today. Glad she is feeling better. Complete abx. F/u prn.   Candidal vaginitis - Plan: fluconazole (DIFLUCAN) 150 MG tablet, Rx diflucan for vag sx after mult abx.   Nexplanon insertion - Plan: etonogestrel (NEXPLANON) 68 MG IMPL implant,     Meds ordered this encounter  Medications  . fluconazole (DIFLUCAN) 150 MG tablet    Sig: Take 1 tablet (150 mg total) by mouth once for 1 dose.    Dispense:  1 tablet    Refill:  0    Order Specific Question:   Supervising Provider    Answer:   Nadara MustardHARRIS, ROBERT P [id number]]  . etonogestrel (NEXPLANON) 68 MG IMPL implant     Sig: 1 each (68 mg total) by Subdermal route once for 1 dose.    Dispense:  1 each    Refill:  0    Order Specific Question:   Supervising Provider    Answer:   Nadara MustardHARRIS, ROBERT P [[id number]]      Return if symptoms worsen or fail to improve.  Alicia B. Copland, PA-C 06/22/2019 5:02 PM

## 2019-06-22 NOTE — Patient Instructions (Signed)
I value your feedback and entrusting us with your care. If you get a [facility] patient survey, I would appreciate you taking the time to let us know about your experience today. Thank you!  Remove the dressing in 24 hours,  keep the incision area dry for 24 hours and remove the Steristrip in 2-3  days.  Notify us if any signs of tenderness, redness, pain, or fevers develop.  

## 2019-07-28 IMAGING — US US ABDOMEN LIMITED
1 series · 14 of 25 positions shown · non-contrast
Comparison: CT abdomen/pelvis dated 01/16/2018

CLINICAL DATA: Right upper quadrant abdominal pain x1 week

EXAM:
ULTRASOUND ABDOMEN LIMITED RIGHT UPPER QUADRANT

[Series 1: us abdomen limited · 0.20mm/px · 14 of 86 slices shown]
[im 1/86]
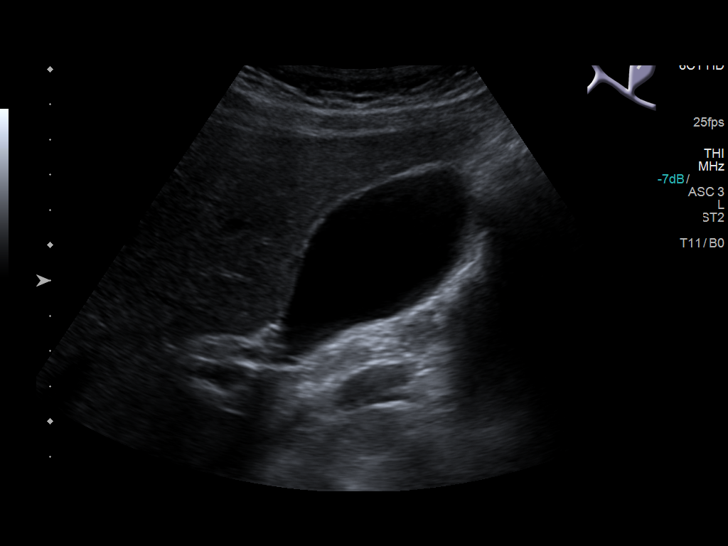
[im 8/86]
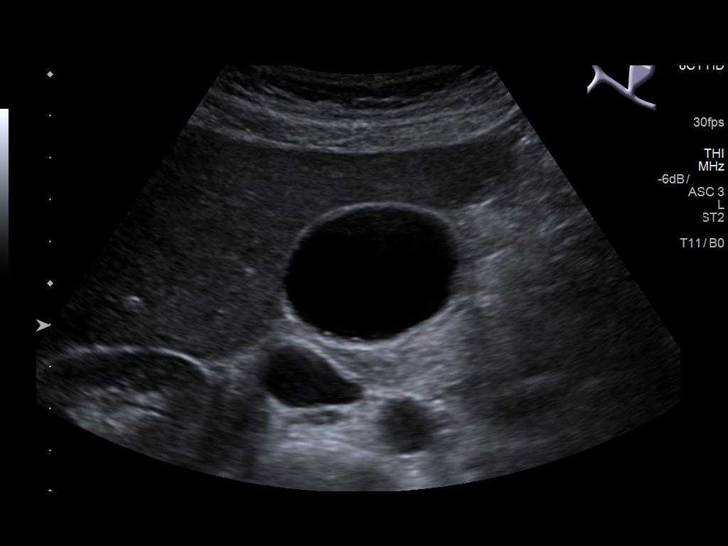
[im 15/86]
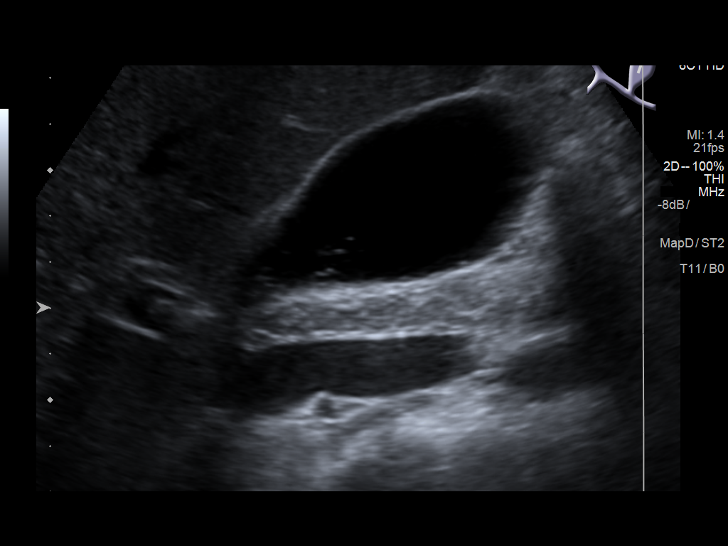
[im 22/86]
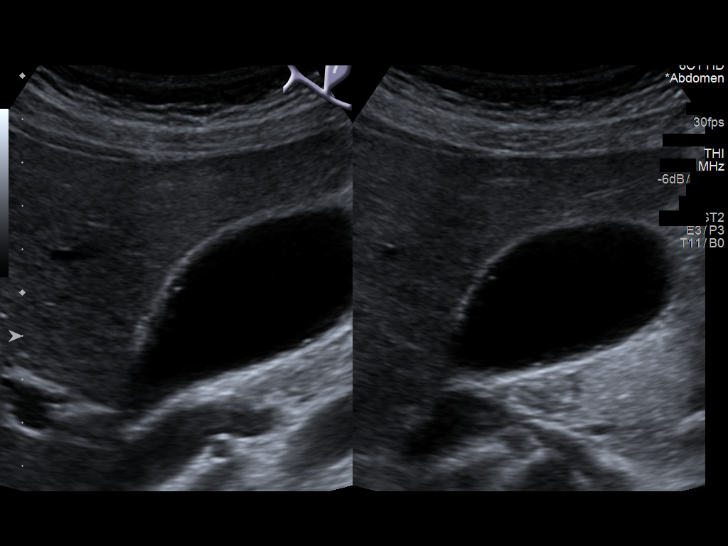
[im 29/86]
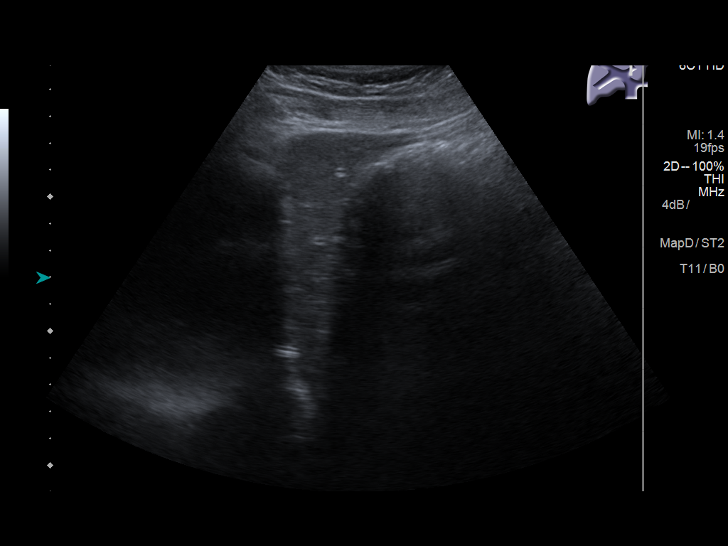
[im 32/86]
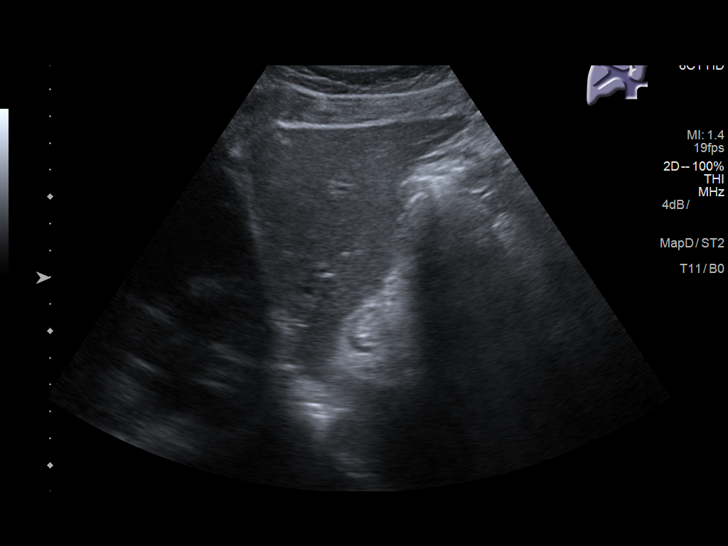
[im 39/86]
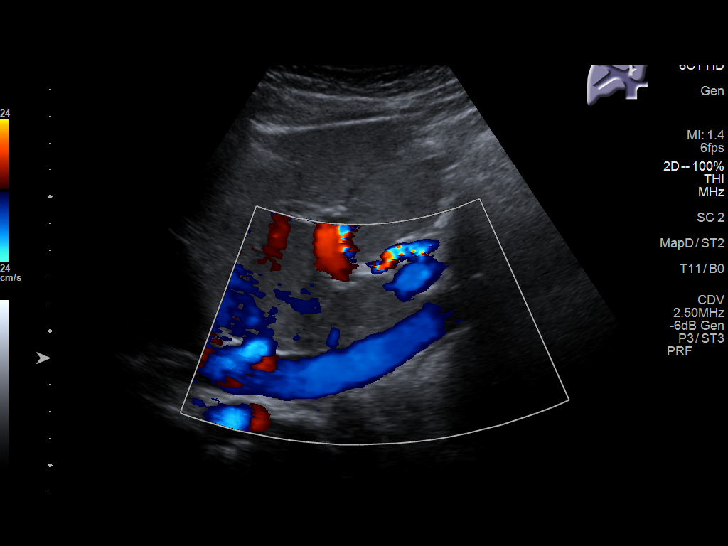
[im 47/86]
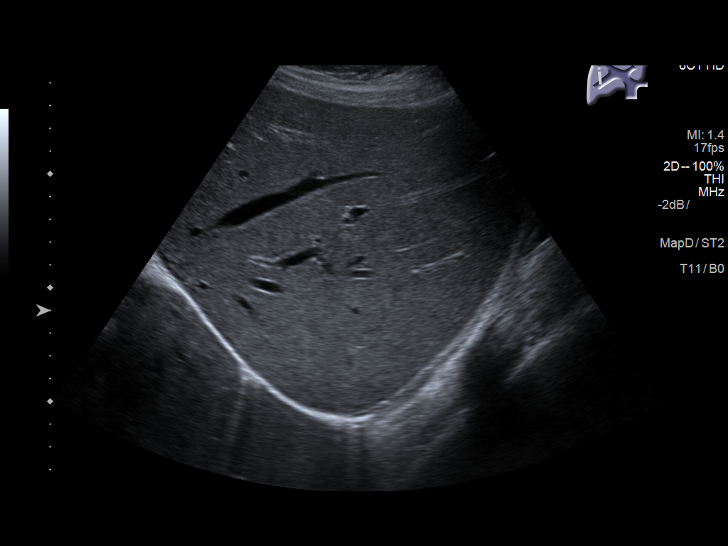
[im 54/86]
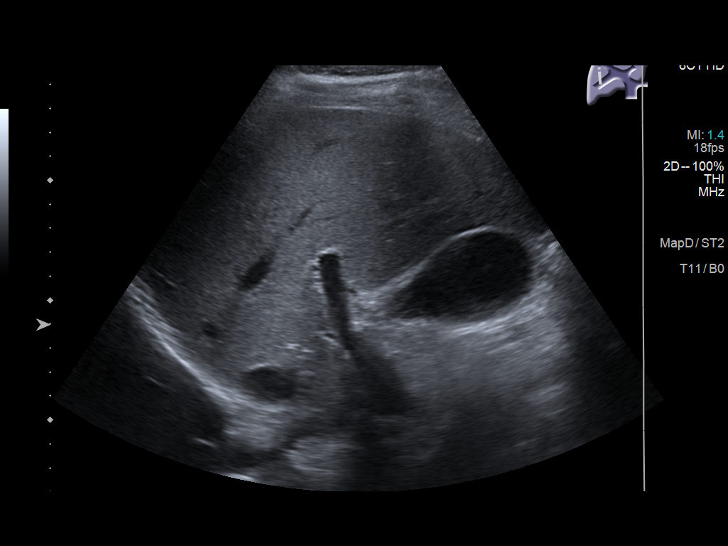
[im 57/86]
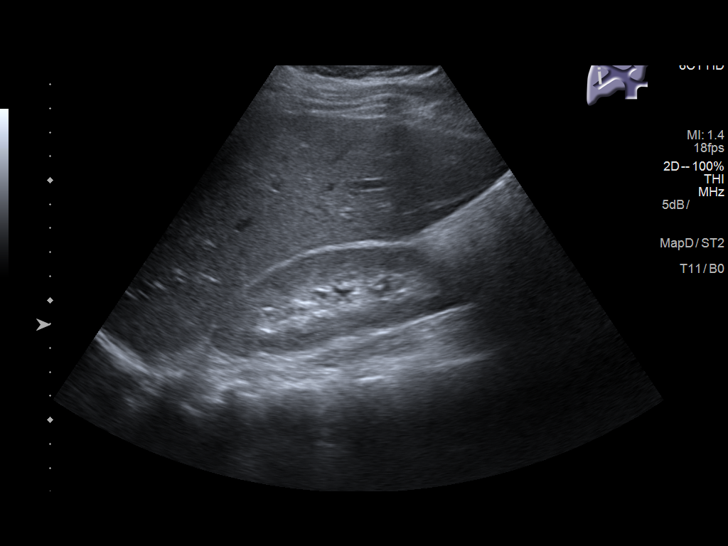
[im 64/86]
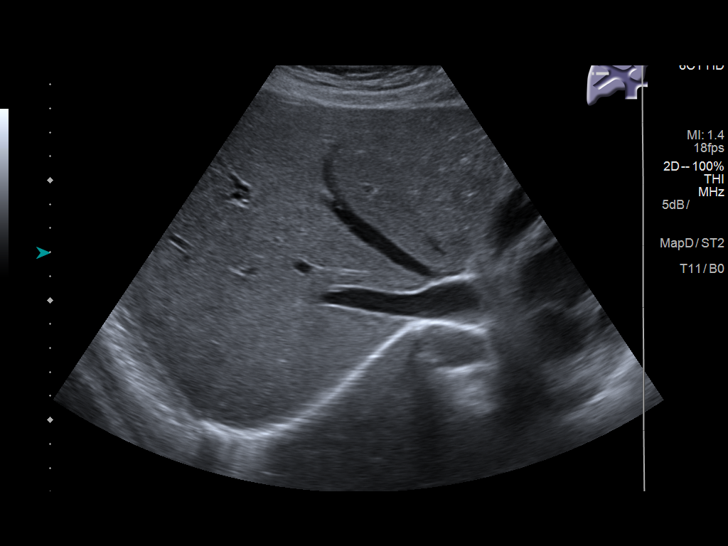
[im 71/86]
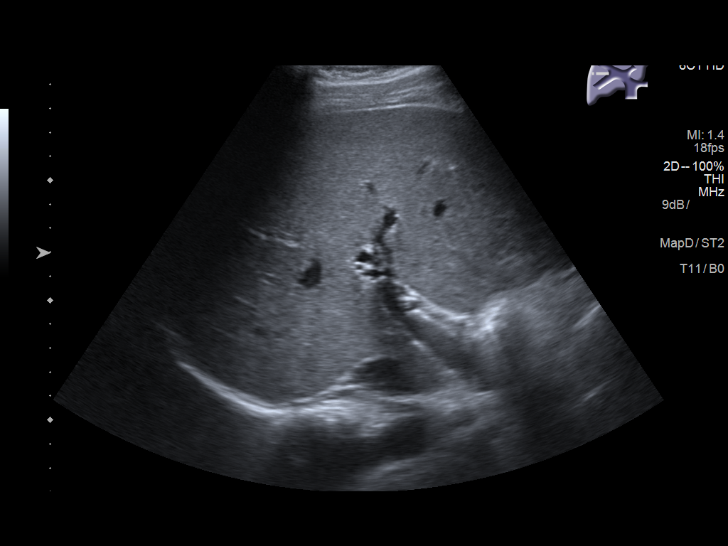
[im 78/86]
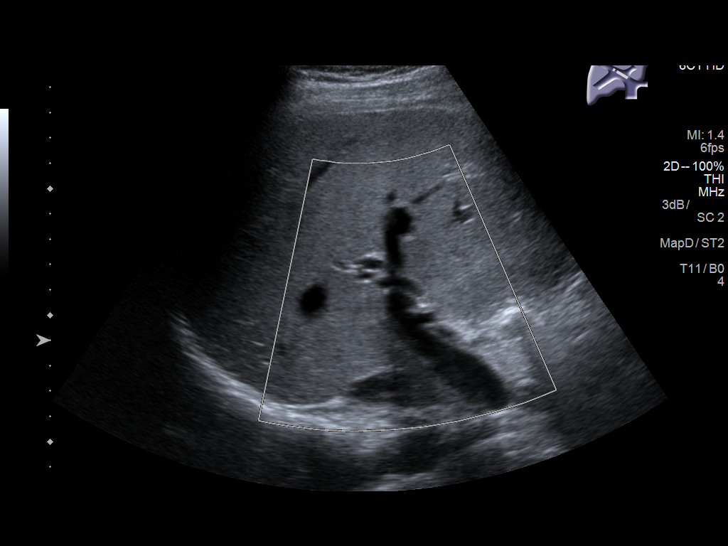
[im 86/86]
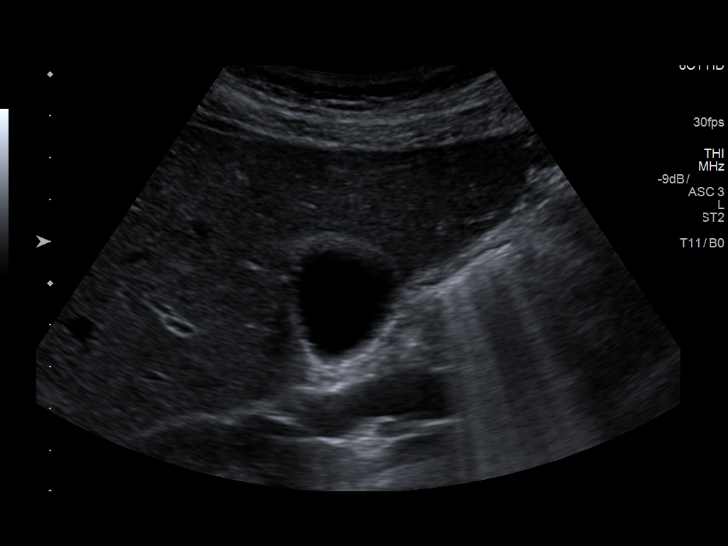

[14 of 25 positions shown; findings below may reference images not displayed]

FINDINGS: Gallbladder:

Multiple small nonshadowing gallstones measuring up to 4 mm. No
gallbladder wall thickening or pericholecystic fluid. Negative
sonographic Murphy's sign.

Common bile duct:

Diameter: 4 mm

Liver:

No focal lesion identified. Within normal limits in parenchymal
echogenicity. Portal vein is patent on color Doppler imaging with
normal direction of blood flow towards the liver.
IMPRESSION: Mild cholelithiasis, without associated sonographic findings to
suggest acute cholecystitis.

## 2019-08-03 ENCOUNTER — Ambulatory Visit: Payer: BC Managed Care – PPO | Admitting: Obstetrics and Gynecology

## 2019-10-21 ENCOUNTER — Other Ambulatory Visit: Payer: Self-pay | Admitting: Obstetrics and Gynecology

## 2019-10-21 ENCOUNTER — Encounter: Payer: Self-pay | Admitting: Obstetrics and Gynecology

## 2019-10-21 MED ORDER — NORETHINDRONE 0.35 MG PO TABS: 1.0000 | ORAL_TABLET | Freq: Every day | ORAL | 0 refills | Status: DC

## 2019-10-21 NOTE — Progress Notes (Signed)
Rx camila eRxd to add to nexplanon for bleeding control.

## 2020-03-05 ENCOUNTER — Encounter: Payer: Self-pay | Admitting: Obstetrics and Gynecology

## 2020-03-06 ENCOUNTER — Other Ambulatory Visit: Payer: Self-pay | Admitting: Obstetrics and Gynecology

## 2020-03-06 MED ORDER — VALACYCLOVIR HCL 500 MG PO TABS: 500.0000 mg | ORAL_TABLET | Freq: Every day | ORAL | 0 refills | Status: DC

## 2020-03-06 NOTE — Progress Notes (Signed)
Rx Valtrex daily for HSV 2.

## 2020-06-13 ENCOUNTER — Other Ambulatory Visit: Payer: Self-pay

## 2020-06-13 ENCOUNTER — Encounter: Payer: Self-pay | Admitting: Obstetrics and Gynecology

## 2020-06-13 ENCOUNTER — Other Ambulatory Visit (HOSPITAL_COMMUNITY)
Admission: RE | Admit: 2020-06-13 | Discharge: 2020-06-13 | Disposition: A | Discharge status: Home / Self Care | Payer: 59 | Source: Ambulatory Visit | Attending: Obstetrics and Gynecology | Admitting: Obstetrics and Gynecology

## 2020-06-13 ENCOUNTER — Ambulatory Visit (INDEPENDENT_AMBULATORY_CARE_PROVIDER_SITE_OTHER): Payer: Commercial Managed Care - PPO | Admitting: Obstetrics and Gynecology

## 2020-06-13 VITALS — BP 120/80 | Ht 60.0 in | Wt 137.0 lb

## 2020-06-13 DIAGNOSIS — N921 Excessive and frequent menstruation with irregular cycle: Secondary | ICD-10-CM | POA: Diagnosis not present

## 2020-06-13 DIAGNOSIS — Z113 Encounter for screening for infections with a predominantly sexual mode of transmission: Secondary | ICD-10-CM | POA: Insufficient documentation

## 2020-06-13 DIAGNOSIS — Z975 Presence of (intrauterine) contraceptive device: Secondary | ICD-10-CM

## 2020-06-13 DIAGNOSIS — N898 Other specified noninflammatory disorders of vagina: Secondary | ICD-10-CM | POA: Diagnosis present

## 2020-06-13 DIAGNOSIS — F419 Anxiety disorder, unspecified: Secondary | ICD-10-CM | POA: Diagnosis not present

## 2020-06-13 DIAGNOSIS — A6004 Herpesviral vulvovaginitis: Secondary | ICD-10-CM

## 2020-06-13 LAB — POCT WET PREP WITH KOH
Clue Cells Wet Prep HPF POC: NEGATIVE
KOH Prep POC: NEGATIVE
Trichomonas, UA: NEGATIVE
Yeast Wet Prep HPF POC: NEGATIVE

## 2020-06-13 MED ORDER — FLUCONAZOLE 150 MG PO TABS: 150.0000 mg | ORAL_TABLET | Freq: Once | ORAL | 0 refills | Status: AC

## 2020-06-13 MED ORDER — NORETHINDRONE 0.35 MG PO TABS: 1.0000 | ORAL_TABLET | Freq: Every day | ORAL | 0 refills | Status: DC

## 2020-06-13 NOTE — Progress Notes (Signed)
McLean-Scocuzza, Pasty Spillers, MD   Chief Complaint  Patient presents with  . Vaginal Itching    irritation, no discharge or odor  . Menstrual Problem    pts periods last up to 3 weeks, sore feeling in pelvic area x 6 months    HPI:      [name] is a 29 y.o. [id number] whose LMP was No LMP recorded. Patient has had an implant., presents today for vaginal itching/irritation, without increased d/c or odor recently. Treated with monistat-1 about 5 days ago without relief. Feels like there is a rough area in the post fourchette that is very itchy. No prior abx use. Did change soaps recently, usually uses Dial. Had HSV outbreak a few wks ago and hasn't felt normal vaginally since. Noticed NT LAN LT inguinal area after HSV outbreak on Left side.   Pt having irreg bleeidng with nexplanon, placed 06/22/19. Feels like she is using tampons or pads all the time. Feels sore in pelvic area.   Also with mood swings and increased anxiety. Is already on sertraline 50 mg daily. Has been under increased stress. Not sure if related to nexplanon or situational.   She has not been sex active recently but has been in past yr. No recent STD testing done. Due for annual.   Past Medical History:  Diagnosis Date  . Anxiety   . Asthma    EXERCISE INDUCED-NO INHALER NOW  . Chronic cholecystitis without calculus   . Depression   . Dysrhythmia    TACHYCARDIA WITH 1ST PREGNANCY-ECHO DONE AND WAS WNL-ASYMPTOMATIC NOW  . Family history of adverse reaction to anesthesia    MOM- N/V  . GERD (gastroesophageal reflux disease)    H/O WITH PREGNANCY  . Heart murmur    AS A CHILD-ASYMPTOMATIC  . Herpes   . History of kidney stones   . Hyperlipidemia   . Hypertension    pregnancy induced  . IBS (irritable bowel syndrome)   . Obesity (BMI 30-39.9)   . Postpartum depression associated with first pregnancy     Past Surgical History:  Procedure Laterality Date  . CESAREAN SECTION  12/21/2014   Teresa Coombs,   . CESAREAN SECTION N/A 05/27/2017   Procedure: CESAREAN SECTION;  Surgeon: Nadara Mustard, MD;  Location: [facility] ORS;  Service: Obstetrics;  Laterality: N/A;  . CHOLECYSTECTOMY N/A 05/15/2018   Procedure: LAPAROSCOPIC CHOLECYSTECTOMY WITH INTRAOPERATIVE CHOLANGIOGRAM;  Surgeon: Earline Mayotte, MD;  Location: [facility] ORS;  Service: General;  Laterality: N/A;  . CYSTOSCOPY/URETEROSCOPY/HOLMIUM LASER/STENT PLACEMENT Right 02/27/2018   Procedure: CYSTOSCOPY/URETEROSCOPY/HOLMIUM LASER/STENT PLACEMENT;  Surgeon: Riki Altes, MD;  Location: [facility] ORS;  Service: Urology;  Laterality: Right;  . OTHER SURGICAL HISTORY     mole bx   . WISDOM TOOTH EXTRACTION      Family History  Problem Relation Age of Onset  . Hypertension Mother   . Depression Mother   . Hyperlipidemia Mother   . Diverticulosis Mother   . Hearing loss Father   . Hypertension Father   . Irritable bowel syndrome Father   . Hyperlipidemia Father   . Breast cancer Maternal Grandmother   . Miscarriages / Stillbirths Maternal Grandmother   . Diabetes Maternal Grandfather   . Heart disease Maternal Grandfather        MI  . Arthritis Paternal Grandfather   . Irritable bowel syndrome Paternal Aunt   . GER disease Paternal Aunt   . Crohn's disease Paternal Aunt   .  Diabetes Other     Social History   Socioeconomic History  . Marital status: Married    Spouse name: Not on file  . Number of children: 2  . Years of education: 14  . Highest education level: Not on file  Occupational History  . Occupation: Sales executive  Tobacco Use  . Smoking status: Never Smoker  . Smokeless tobacco: Never Used  Vaping Use  . Vaping Use: Never used  Substance and Sexual Activity  . Alcohol use: Yes    Comment: occasion  . Drug use: No  . Sexual activity: Not Currently    Partners: Male    Birth control/protection: Implant  Other Topics Concern  . Not on file  Social History Narrative   Married    2 Event organiser    Social Determinants of Health   Financial Resource Strain:   . Difficulty of Paying Living Expenses:   Food Insecurity:   . Worried About Programme researcher, broadcasting/film/video in the Last Year:   . Barista in the Last Year:   Transportation Needs:   . Freight forwarder (Medical):   Marland Kitchen Lack of Transportation (Non-Medical):   Physical Activity:   . Days of Exercise per Week:   . Minutes of Exercise per Session:   Stress:   . Feeling of Stress :   Social Connections:   . Frequency of Communication with Friends and Family:   . Frequency of Social Gatherings with Friends and Family:   . Attends Religious Services:   . Active Member of Clubs or Organizations:   . Attends Banker Meetings:   Marland Kitchen Marital Status:   Intimate Partner Violence:   . Fear of Current or Ex-Partner:   . Emotionally Abused:   Marland Kitchen Physically Abused:   . Sexually Abused:     Outpatient Medications Prior to Visit  Medication Sig Dispense Refill  . sertraline (ZOLOFT) 50 MG tablet Take 1 tablet (50 mg total) by mouth daily. 90 tablet 1  . valACYclovir (VALTREX) 500 MG tablet Take 1 tablet (500 mg total) by mouth daily. 90 tablet 0  . etonogestrel (NEXPLANON) 68 MG IMPL implant 1 each (68 mg total) by Subdermal route once for 1 dose. 1 each 0  . azithromycin (ZITHROMAX) 500 MG tablet Take 1 g (2 tabs) once a wk for 2 wks 4 tablet 0  . norethindrone (MICRONOR) 0.35 MG tablet Take 1 tablet (0.35 mg total) by mouth daily. 84 tablet 0  . ondansetron (ZOFRAN-ODT) 8 MG disintegrating tablet Take 1 tablet (8 mg total) by mouth every 8 (eight) hours as needed for nausea. 12 tablet 0   No facility-administered medications prior to visit.      ROS:  Review of Systems  Constitutional: Negative for fever.  Gastrointestinal: Negative for blood in stool, constipation, diarrhea, nausea and vomiting.  Genitourinary: Positive for menstrual problem, pelvic pain and vaginal discharge. Negative for  dyspareunia, dysuria, flank pain, frequency, hematuria, urgency, vaginal bleeding and vaginal pain.  Musculoskeletal: Negative for back pain.  Skin: Negative for rash.  BREAST: No symptoms   OBJECTIVE:   Vitals:  BP 120/80   Ht 5' (1.524 m)   Wt 137 lb (62.1 kg)   BMI 26.76 kg/m   Physical Exam Vitals reviewed.  Constitutional:      Appearance: She is well-developed.  Pulmonary:     Effort: Pulmonary effort is normal.  Genitourinary:    General: Normal vulva.  Pubic Area: No rash.      Labia:        Right: No rash, tenderness or lesion.        Left: No rash, tenderness or lesion.      Vagina: Vaginal discharge present. No erythema or tenderness.     Cervix: Normal.     Uterus: Normal. Not enlarged and not tender.      Adnexa: Right adnexa normal and left adnexa normal.       Right: No mass or tenderness.         Left: No mass or tenderness.      Musculoskeletal:        General: Normal range of motion.     Cervical back: Normal range of motion.  Lymphadenopathy:     Lower Body: Left inguinal adenopathy present.  Skin:    General: Skin is warm and dry.  Neurological:     General: No focal deficit present.     Mental Status: She is alert and oriented to person, place, and time.  Psychiatric:        Mood and Affect: Mood normal.        Behavior: Behavior normal.        Thought Content: Thought content normal.        Judgment: Judgment normal.     Results: Results for orders placed or performed in visit on 06/13/20 (from the past 24 hour(s))  POCT Wet Prep with KOH     Status: Normal   Collection Time: 06/13/20  4:17 PM  Result Value Ref Range   Trichomonas, UA Negative    Clue Cells Wet Prep HPF POC neg    Epithelial Wet Prep HPF POC     Yeast Wet Prep HPF POC neg    Bacteria Wet Prep HPF POC     RBC Wet Prep HPF POC     WBC Wet Prep HPF POC     KOH Prep POC Negative Negative  Treated with monistat-1 5 days ago   Assessment/Plan: Vaginal itching -  Plan: fluconazole (DIFLUCAN) 150 MG tablet, POCT Wet Prep with KOH, Cervicovaginal ancillary only; Pos sx, neg wet prep. Rx diflucan, check yeast on nuswab. Will f/u if pos. Try OTC hydrocortisone crm ext BID.   Breakthrough bleeding on Nexplanon - Plan: norethindrone (MICRONOR) 0.35 MG tablet; add camila POPs. See if sx improve. F/u prn.   Screening for STD (sexually transmitted disease) - Plan: Cervicovaginal ancillary only  Anxiety--increased recently. F/u with PCP for sertraline adjustment. If sx improve, then most likely not related to nexplanon. If no change, could be nexplanon and pt could consider removal.   Herpes simplex vulvovaginitis--residual mild LAN LT ing area. Reassurance. F/u prn.    Meds ordered this encounter  Medications  . fluconazole (DIFLUCAN) 150 MG tablet    Sig: Take 1 tablet (150 mg total) by mouth once for 1 dose.    Dispense:  1 tablet    Refill:  0    Order Specific Question:   Supervising Provider    Answer:   Nadara Mustard [id number]  . norethindrone (MICRONOR) 0.35 MG tablet    Sig: Take 1 tablet (0.35 mg total) by mouth daily.    Dispense:  84 tablet    Refill:  0    Order Specific Question:   Supervising Provider    Answer:   Nadara Mustard [[id number]]      Return if symptoms worsen or fail to improve.  Helmut Muster  B. Anvith Mauriello, PA-C 06/13/2020 4:20 PM

## 2020-06-13 NOTE — Patient Instructions (Signed)
I value your feedback and entrusting us with your care. If you get a [facility] patient survey, I would appreciate you taking the time to let us know about your experience today. Thank you!  As of December 02, 2019, your lab results will be released to your MyChart immediately, before I even have a chance to see them. Please give me time to review them and contact you if there are any abnormalities. Thank you for your patience.  

## 2020-06-15 LAB — CERVICOVAGINAL ANCILLARY ONLY
Candida Glabrata: NEGATIVE
Candida Vaginitis: POSITIVE — AB
Chlamydia: NEGATIVE
Comment: NEGATIVE
Comment: NEGATIVE
Comment: NEGATIVE
Comment: NORMAL
Neisseria Gonorrhea: NEGATIVE

## 2020-06-29 ENCOUNTER — Encounter: Payer: Self-pay | Admitting: Obstetrics and Gynecology

## 2020-06-29 NOTE — Progress Notes (Deleted)
PCP:  McLean-Scocuzza, Pasty Spillers, MD   No chief complaint on file.    HPI:      [name] is a 29 y.o. [phone] whose LMP was No LMP recorded. Patient has had an implant., presents today for her annual examination.  Her menses are BTB with nexplanon  {norm/abn:715}, lasting {number:22536} days.  Dysmenorrhea {dysmen:716}. She {does:18564} have intermenstrual bleeding.  Sex activity: single partner, contraception - nexplanon placed 06/22/19 .  Last Pap: 07/10/18  Results were: no abnormalities Hx of STDs: {STD hx:14358} Vaginal itching 6/21 treated with diflucan?***  There is no FH of breast cancer. There is no FH of ovarian cancer. The patient {does:18564} do self-breast exams.  Tobacco use: {tob:20664} Alcohol use: {Alcohol:11675} No drug use.  Exercise: {exercise:31265}  She {does:18564} get adequate calcium and Vitamin D in her diet.    The pregnancy intention screening data noted above was reviewed. Potential methods of contraception were discussed. The patient elected to proceed with {Upstream End Methods:24109}.    Past Medical History:  Diagnosis Date  . Anxiety   . Asthma    EXERCISE INDUCED-NO INHALER NOW  . Chronic cholecystitis without calculus   . Depression   . Dysrhythmia    TACHYCARDIA WITH 1ST PREGNANCY-ECHO DONE AND WAS WNL-ASYMPTOMATIC NOW  . Family history of adverse reaction to anesthesia    MOM- N/V  . GERD (gastroesophageal reflux disease)    H/O WITH PREGNANCY  . Heart murmur    AS A CHILD-ASYMPTOMATIC  . Herpes   . History of kidney stones   . Hyperlipidemia   . Hypertension    pregnancy induced  . IBS (irritable bowel syndrome)   . Obesity (BMI 30-39.9)   . Postpartum depression associated with first pregnancy     Past Surgical History:  Procedure Laterality Date  . CESAREAN SECTION  12/21/2014   Teresa Coombs, [place]  . CESAREAN SECTION N/A 05/27/2017   Procedure: CESAREAN SECTION;  Surgeon: Nadara Mustard, MD;  Location: [facility]  ORS;  Service: Obstetrics;  Laterality: N/A;  . CHOLECYSTECTOMY N/A 05/15/2018   Procedure: LAPAROSCOPIC CHOLECYSTECTOMY WITH INTRAOPERATIVE CHOLANGIOGRAM;  Surgeon: Earline Mayotte, MD;  Location: [facility] ORS;  Service: General;  Laterality: N/A;  . CYSTOSCOPY/URETEROSCOPY/HOLMIUM LASER/STENT PLACEMENT Right 02/27/2018   Procedure: CYSTOSCOPY/URETEROSCOPY/HOLMIUM LASER/STENT PLACEMENT;  Surgeon: Riki Altes, MD;  Location: [facility] ORS;  Service: Urology;  Laterality: Right;  . OTHER SURGICAL HISTORY     mole bx   . WISDOM TOOTH EXTRACTION      Family History  Problem Relation Age of Onset  . Hypertension Mother   . Depression Mother   . Hyperlipidemia Mother   . Diverticulosis Mother   . Hearing loss Father   . Hypertension Father   . Irritable bowel syndrome Father   . Hyperlipidemia Father   . Breast cancer Maternal Grandmother   . Miscarriages / Stillbirths Maternal Grandmother   . Diabetes Maternal Grandfather   . Heart disease Maternal Grandfather        MI  . Arthritis Paternal Grandfather   . Irritable bowel syndrome Paternal Aunt   . GER disease Paternal Aunt   . Crohn's disease Paternal Aunt   . Diabetes Other     Social History   Socioeconomic History  . Marital status: Married    Spouse name: Not on file  . Number of children: 2  . Years of education: 30  . Highest education level: Not on file  Occupational History  . Occupation: Dealer  assistant  Tobacco Use  . Smoking status: Never Smoker  . Smokeless tobacco: Never Used  Vaping Use  . Vaping Use: Never used  Substance and Sexual Activity  . Alcohol use: Yes    Comment: occasion  . Drug use: No  . Sexual activity: Not Currently    Partners: Male    Birth control/protection: Implant  Other Topics Concern  . Not on file  Social History Narrative   Married    2 Retail buyer    Social Determinants of Health   Financial Resource Strain:   . Difficulty of Paying Living Expenses:     Food Insecurity:   . Worried About Programme researcher, broadcasting/film/video in the Last Year:   . Barista in the Last Year:   Transportation Needs:   . Freight forwarder (Medical):   Marland Kitchen Lack of Transportation (Non-Medical):   Physical Activity:   . Days of Exercise per Week:   . Minutes of Exercise per Session:   Stress:   . Feeling of Stress :   Social Connections:   . Frequency of Communication with Friends and Family:   . Frequency of Social Gatherings with Friends and Family:   . Attends Religious Services:   . Active Member of Clubs or Organizations:   . Attends Banker Meetings:   Marland Kitchen Marital Status:   Intimate Partner Violence:   . Fear of Current or Ex-Partner:   . Emotionally Abused:   Marland Kitchen Physically Abused:   . Sexually Abused:      Current Outpatient Medications:  .  etonogestrel (NEXPLANON) 68 MG IMPL implant, 1 each (68 mg total) by Subdermal route once for 1 dose., Disp: 1 each, Rfl: 0 .  norethindrone (MICRONOR) 0.35 MG tablet, Take 1 tablet (0.35 mg total) by mouth daily., Disp: 84 tablet, Rfl: 0 .  sertraline (ZOLOFT) 50 MG tablet, Take 1 tablet (50 mg total) by mouth daily., Disp: 90 tablet, Rfl: 1 .  valACYclovir (VALTREX) 500 MG tablet, Take 1 tablet (500 mg total) by mouth daily., Disp: 90 tablet, Rfl: 0     ROS:  Review of Systems BREAST: No symptoms   Objective: There were no vitals taken for this visit.   OBGyn Exam  Results: No results found for this or any previous visit (from the past 24 hour(s)).  Assessment/Plan: No diagnosis found.  No orders of the defined types were placed in this encounter.            GYN counsel {counseling:16159}     F/U  No follow-ups on file.  Ahmar Pickrell B. Dorethy Tomey, PA-C 06/29/2020 5:37 PM

## 2020-06-30 ENCOUNTER — Ambulatory Visit: Payer: Self-pay | Admitting: Obstetrics and Gynecology

## 2020-08-07 ENCOUNTER — Encounter: Payer: Self-pay | Admitting: Obstetrics and Gynecology

## 2020-08-13 NOTE — Telephone Encounter (Signed)
Pls check with pt re: sx and coordinate nurse visit for urine check if still sx. Thx

## 2020-08-16 ENCOUNTER — Ambulatory Visit: Payer: 59

## 2020-08-16 ENCOUNTER — Telehealth: Payer: Self-pay

## 2020-08-16 ENCOUNTER — Ambulatory Visit (INDEPENDENT_AMBULATORY_CARE_PROVIDER_SITE_OTHER): Payer: 59

## 2020-08-16 ENCOUNTER — Other Ambulatory Visit: Payer: Self-pay

## 2020-08-16 DIAGNOSIS — R300 Dysuria: Secondary | ICD-10-CM

## 2020-08-16 DIAGNOSIS — R399 Unspecified symptoms and signs involving the genitourinary system: Secondary | ICD-10-CM

## 2020-08-16 LAB — POCT URINALYSIS DIPSTICK
Bilirubin, UA: NEGATIVE
Blood, UA: POSITIVE
Clarity, UA: NEGATIVE
Glucose, UA: NEGATIVE
Ketones, UA: NEGATIVE
Nitrite, UA: NEGATIVE
Protein, UA: NEGATIVE
Spec Grav, UA: 1.01 (ref 1.010–1.025)
Urobilinogen, UA: 0.2 E.U./dL
pH, UA: 5 (ref 5.0–8.0)

## 2020-08-16 MED ORDER — NITROFURANTOIN MONOHYD MACRO 100 MG PO CAPS: 100.0000 mg | ORAL_CAPSULE | Freq: Two times a day (BID) | ORAL | 0 refills | Status: AC

## 2020-08-16 NOTE — Telephone Encounter (Signed)
Called pt no answer and voice mail box full

## 2020-08-16 NOTE — Telephone Encounter (Signed)
Pt dropped off urine and has positive blood and trace of Leukocytes, do you want to send in RX or wait for lab results? Do you want me to order urine culture?

## 2020-08-16 NOTE — Telephone Encounter (Signed)
Pls send for culture, order placed. I'll send in abx. Pls let pt know.

## 2020-08-17 NOTE — Telephone Encounter (Signed)
Called pt again no answer and mailbox still full

## 2020-08-19 LAB — URINE CULTURE

## 2020-08-27 ENCOUNTER — Other Ambulatory Visit: Payer: Self-pay | Admitting: Obstetrics and Gynecology

## 2020-08-27 DIAGNOSIS — Z975 Presence of (intrauterine) contraceptive device: Secondary | ICD-10-CM

## 2020-08-27 DIAGNOSIS — N921 Excessive and frequent menstruation with irregular cycle: Secondary | ICD-10-CM

## 2020-09-14 ENCOUNTER — Ambulatory Visit: Payer: Self-pay | Admitting: Obstetrics and Gynecology

## 2020-09-14 NOTE — Progress Notes (Deleted)
PCP:  McLean-Scocuzza, Pasty Spillers, MD   No chief complaint on file.    HPI:      [name] is a 29 y.o. [phone] whose LMP was No LMP recorded. Patient has had an implant., presents today for her annual examination.  Her menses are {norm/abn:715}, lasting {number:22536} days.  Dysmenorrhea {dysmen:716}. She {does:18564} have intermenstrual bleeding.  Sex activity: single partner, contraception - nexplanon inserted 06/22/19. Taken POPs for bleeding control Last Pap: 07/10/18  Results were: no abnormalities . Neg STD testing 6/21 Hx of STDs: HSV, takes valtrex daily as preventive  There is no FH of breast cancer. There is no FH of ovarian cancer. The patient {does:18564} do self-breast exams.  Tobacco use: {tob:20664} Alcohol use: {Alcohol:11675} No drug use.  Exercise: {exercise:31265}  She {does:18564} get adequate calcium and Vitamin D in her diet.   The pregnancy intention screening data noted above was reviewed. Potential methods of contraception were discussed. The patient elected to proceed with {Upstream End Methods:24109}.     Past Medical History:  Diagnosis Date  . Anxiety   . Asthma    EXERCISE INDUCED-NO INHALER NOW  . Chronic cholecystitis without calculus   . Depression   . Dysrhythmia    TACHYCARDIA WITH 1ST PREGNANCY-ECHO DONE AND WAS WNL-ASYMPTOMATIC NOW  . Family history of adverse reaction to anesthesia    MOM- N/V  . Genital herpes   . GERD (gastroesophageal reflux disease)    H/O WITH PREGNANCY  . Heart murmur    AS A CHILD-ASYMPTOMATIC  . History of kidney stones   . Hyperlipidemia   . Hypertension    pregnancy induced  . IBS (irritable bowel syndrome)   . Obesity (BMI 30-39.9)   . Postpartum depression associated with first pregnancy     Past Surgical History:  Procedure Laterality Date  . CESAREAN SECTION  12/21/2014   Teresa Coombs, [place]  . CESAREAN SECTION N/A 05/27/2017   Procedure: CESAREAN SECTION;  Surgeon: Nadara Mustard,  MD;  Location: [facility] ORS;  Service: Obstetrics;  Laterality: N/A;  . CHOLECYSTECTOMY N/A 05/15/2018   Procedure: LAPAROSCOPIC CHOLECYSTECTOMY WITH INTRAOPERATIVE CHOLANGIOGRAM;  Surgeon: Earline Mayotte, MD;  Location: [facility] ORS;  Service: General;  Laterality: N/A;  . CYSTOSCOPY/URETEROSCOPY/HOLMIUM LASER/STENT PLACEMENT Right 02/27/2018   Procedure: CYSTOSCOPY/URETEROSCOPY/HOLMIUM LASER/STENT PLACEMENT;  Surgeon: Riki Altes, MD;  Location: [facility] ORS;  Service: Urology;  Laterality: Right;  . OTHER SURGICAL HISTORY     mole bx   . WISDOM TOOTH EXTRACTION      Family History  Problem Relation Age of Onset  . Hypertension Mother   . Depression Mother   . Hyperlipidemia Mother   . Diverticulosis Mother   . Hearing loss Father   . Hypertension Father   . Irritable bowel syndrome Father   . Hyperlipidemia Father   . Breast cancer Maternal Grandmother   . Miscarriages / Stillbirths Maternal Grandmother   . Diabetes Maternal Grandfather   . Heart disease Maternal Grandfather        MI  . Arthritis Paternal Grandfather   . Irritable bowel syndrome Paternal Aunt   . GER disease Paternal Aunt   . Crohn's disease Paternal Aunt   . Diabetes Other     Social History   Socioeconomic History  . Marital status: Married    Spouse name: Not on file  . Number of children: 2  . Years of education: 80  . Highest education level: Not on file  Occupational History  .  Occupation: Sales executive  Tobacco Use  . Smoking status: Never Smoker  . Smokeless tobacco: Never Used  Vaping Use  . Vaping Use: Never used  Substance and Sexual Activity  . Alcohol use: Yes    Comment: occasion  . Drug use: No  . Sexual activity: Not Currently    Partners: Male    Birth control/protection: Implant  Other Topics Concern  . Not on file  Social History Narrative   Married    2 Retail buyer    Social Determinants of Health   Financial Resource Strain:   . Difficulty of Paying  Living Expenses: Not on file  Food Insecurity:   . Worried About Programme researcher, broadcasting/film/video in the Last Year: Not on file  . Ran Out of Food in the Last Year: Not on file  Transportation Needs:   . Lack of Transportation (Medical): Not on file  . Lack of Transportation (Non-Medical): Not on file  Physical Activity:   . Days of Exercise per Week: Not on file  . Minutes of Exercise per Session: Not on file  Stress:   . Feeling of Stress : Not on file  Social Connections:   . Frequency of Communication with Friends and Family: Not on file  . Frequency of Social Gatherings with Friends and Family: Not on file  . Attends Religious Services: Not on file  . Active Member of Clubs or Organizations: Not on file  . Attends Banker Meetings: Not on file  . Marital Status: Not on file  Intimate Partner Violence:   . Fear of Current or Ex-Partner: Not on file  . Emotionally Abused: Not on file  . Physically Abused: Not on file  . Sexually Abused: Not on file     Current Outpatient Medications:  .  etonogestrel (NEXPLANON) 68 MG IMPL implant, 1 each (68 mg total) by Subdermal route once for 1 dose., Disp: 1 each, Rfl: 0 .  norethindrone (MICRONOR) 0.35 MG tablet, Take 1 tablet (0.35 mg total) by mouth daily., Disp: 84 tablet, Rfl: 0 .  sertraline (ZOLOFT) 50 MG tablet, Take 1 tablet (50 mg total) by mouth daily., Disp: 90 tablet, Rfl: 1 .  valACYclovir (VALTREX) 500 MG tablet, Take 1 tablet (500 mg total) by mouth daily., Disp: 90 tablet, Rfl: 0     ROS:  Review of Systems BREAST: No symptoms   Objective: There were no vitals taken for this visit.   OBGyn Exam  Results: No results found for this or any previous visit (from the past 24 hour(s)).  Assessment/Plan: No diagnosis found.  No orders of the defined types were placed in this encounter.            GYN counsel {counseling:16159}     F/U  No follow-ups on file.  Saloma Cadena B. Greydon Betke, PA-C 09/14/2020 11:11  AM

## 2020-09-24 ENCOUNTER — Other Ambulatory Visit: Payer: Self-pay | Admitting: Obstetrics and Gynecology

## 2020-10-20 ENCOUNTER — Other Ambulatory Visit (HOSPITAL_COMMUNITY)
Admission: RE | Admit: 2020-10-20 | Discharge: 2020-10-20 | Disposition: A | Discharge status: Home / Self Care | Payer: 59 | Source: Ambulatory Visit | Attending: Obstetrics | Admitting: Obstetrics

## 2020-10-20 ENCOUNTER — Encounter: Payer: Self-pay | Admitting: Obstetrics

## 2020-10-20 ENCOUNTER — Other Ambulatory Visit: Payer: Self-pay

## 2020-10-20 ENCOUNTER — Ambulatory Visit (INDEPENDENT_AMBULATORY_CARE_PROVIDER_SITE_OTHER): Payer: 59 | Admitting: Obstetrics

## 2020-10-20 VITALS — BP 136/82 | HR 66 | Ht 60.0 in | Wt 135.0 lb

## 2020-10-20 DIAGNOSIS — N921 Excessive and frequent menstruation with irregular cycle: Secondary | ICD-10-CM | POA: Diagnosis not present

## 2020-10-20 DIAGNOSIS — Z113 Encounter for screening for infections with a predominantly sexual mode of transmission: Secondary | ICD-10-CM | POA: Diagnosis not present

## 2020-10-20 DIAGNOSIS — Z3046 Encounter for surveillance of implantable subdermal contraceptive: Secondary | ICD-10-CM | POA: Diagnosis not present

## 2020-10-20 DIAGNOSIS — Z124 Encounter for screening for malignant neoplasm of cervix: Secondary | ICD-10-CM | POA: Diagnosis present

## 2020-10-20 DIAGNOSIS — Z30011 Encounter for initial prescription of contraceptive pills: Secondary | ICD-10-CM

## 2020-10-20 DIAGNOSIS — F419 Anxiety disorder, unspecified: Secondary | ICD-10-CM

## 2020-10-20 DIAGNOSIS — Z01419 Encounter for gynecological examination (general) (routine) without abnormal findings: Secondary | ICD-10-CM | POA: Insufficient documentation

## 2020-10-20 DIAGNOSIS — F32A Depression, unspecified: Secondary | ICD-10-CM

## 2020-10-20 DIAGNOSIS — Z975 Presence of (intrauterine) contraceptive device: Secondary | ICD-10-CM

## 2020-10-20 MED ORDER — NORGESTIMATE-ETH ESTRADIOL 0.25-35 MG-MCG PO TABS: 1.0000 | ORAL_TABLET | Freq: Every day | ORAL | 11 refills | Status: DC

## 2020-10-20 MED ORDER — SERTRALINE HCL 50 MG PO TABS: 50.0000 mg | ORAL_TABLET | Freq: Every day | ORAL | 2 refills | Status: DC

## 2020-10-20 NOTE — Progress Notes (Signed)
Gynecology Annual Exam  PCP: McLean-Scocuzza, Pasty Spillers, MD  Chief Complaint:  Chief Complaint  Patient presents with  . Gynecologic Exam    issues c nexplanon; wants it out as soon as possible; bleeding x a whole month and is still bleeding.    History of Present Illness: Megan Kramer is a 29 y.o. [phone] presents for annual exam. The patient complains of ongoing irregular bleeding as long as she has had the Nexplanon in place.. she presents for her annual exam , but desires removal of the Nexplanon.  Her menses are regular, they occur every month, and they last 6 days. Her flow is light. She does have intermenstrual bleeding. Her last menstrual period was NA- just irregular spotting, no regular cycle. She denies dysmenorrhea. Last pap smear: 2019, results were NILM   The patient is  sexually active. She currently uses Nexplanon for contraception. She does not have dyspareunia.  Since her last visit, she has had no significant changes in her health.  Her past medical history is remarkable for two CSections, 1 Elective abortion; GAD  The patient does perform self breast exams. Her last mammogram was NA, results.   There is a family history of breast cancer in her grandmother Genetic testing has not been done.   There is no family history of ovarian cancer. Genetic testing has not been done.  The patient denies smoking.  She denies drinking.   She denies illegal drug use.  The patient reports exercising occasionally.  The patient denies current symptoms of depression.  She has been treated in the past for generalized anxiety disorder, and recently started back on Zoloft 50 mg pos daily. She is requesting a RX for Zoloft.  Review of Systems: Review of Systems  Constitutional: Negative.   HENT: Negative.   Eyes: Negative.   Respiratory: Negative.   Cardiovascular: Negative.   Gastrointestinal: Negative.   Genitourinary: Negative.   Musculoskeletal: Negative.    Skin: Negative.   Neurological: Negative.   Endo/Heme/Allergies: Negative.   Psychiatric/Behavioral: The patient is nervous/anxious.        Hx of GAD. Recent marital divorce - in counseling, and has started taking Zoloft daily    Past Medical History:  Past Medical History:  Diagnosis Date  . Anxiety   . Asthma    EXERCISE INDUCED-NO INHALER NOW  . Chronic cholecystitis without calculus   . Depression   . Dysrhythmia    TACHYCARDIA WITH 1ST PREGNANCY-ECHO DONE AND WAS WNL-ASYMPTOMATIC NOW  . Family history of adverse reaction to anesthesia    MOM- N/V  . Genital herpes   . GERD (gastroesophageal reflux disease)    H/O WITH PREGNANCY  . Heart murmur    AS A CHILD-ASYMPTOMATIC  . History of kidney stones   . Hyperlipidemia   . Hypertension    pregnancy induced  . IBS (irritable bowel syndrome)   . Obesity (BMI 30-39.9)   . Postpartum depression associated with first pregnancy     Past Surgical History:  Past Surgical History:  Procedure Laterality Date  . CESAREAN SECTION  12/21/2014   Teresa Coombs, [place]  . CESAREAN SECTION N/A 05/27/2017   Procedure: CESAREAN SECTION;  Surgeon: Nadara Mustard, MD;  Location: [facility] ORS;  Service: Obstetrics;  Laterality: N/A;  . CHOLECYSTECTOMY N/A 05/15/2018   Procedure: LAPAROSCOPIC CHOLECYSTECTOMY WITH INTRAOPERATIVE CHOLANGIOGRAM;  Surgeon: Earline Mayotte, MD;  Location: [facility] ORS;  Service: General;  Laterality: N/A;  . CYSTOSCOPY/URETEROSCOPY/HOLMIUM LASER/STENT PLACEMENT Right  02/27/2018   Procedure: CYSTOSCOPY/URETEROSCOPY/HOLMIUM LASER/STENT PLACEMENT;  Surgeon: Riki Altes, MD;  Location: [facility] ORS;  Service: Urology;  Laterality: Right;  . OTHER SURGICAL HISTORY     mole bx   . WISDOM TOOTH EXTRACTION      Family History:  Family History  Problem Relation Age of Onset  . Hypertension Mother   . Depression Mother   . Hyperlipidemia Mother   . Diverticulosis Mother   . Hearing loss Father   . Hypertension Father   .  Irritable bowel syndrome Father   . Hyperlipidemia Father   . Breast cancer Maternal Grandmother   . Miscarriages / Stillbirths Maternal Grandmother   . Diabetes Maternal Grandfather   . Heart disease Maternal Grandfather        MI  . Arthritis Paternal Grandfather   . Irritable bowel syndrome Paternal Aunt   . GER disease Paternal Aunt   . Crohn's disease Paternal Aunt   . Diabetes Other     Social History:  Social History   Socioeconomic History  . Marital status: Married    Spouse name: Not on file  . Number of children: 2  . Years of education: 37  . Highest education level: Not on file  Occupational History  . Occupation: Sales executive  Tobacco Use  . Smoking status: Never Smoker  . Smokeless tobacco: Never Used  Vaping Use  . Vaping Use: Never used  Substance and Sexual Activity  . Alcohol use: Yes    Comment: occasion  . Drug use: No  . Sexual activity: Yes    Partners: Male    Birth control/protection: Implant  Other Topics Concern  . Not on file  Social History Narrative   Married    2 Retail buyer    Social Determinants of Health   Financial Resource Strain:   . Difficulty of Paying Living Expenses: Not on file  Food Insecurity:   . Worried About Programme researcher, broadcasting/film/video in the Last Year: Not on file  . Ran Out of Food in the Last Year: Not on file  Transportation Needs:   . Lack of Transportation (Medical): Not on file  . Lack of Transportation (Non-Medical): Not on file  Physical Activity:   . Days of Exercise per Week: Not on file  . Minutes of Exercise per Session: Not on file  Stress:   . Feeling of Stress : Not on file  Social Connections:   . Frequency of Communication with Friends and Family: Not on file  . Frequency of Social Gatherings with Friends and Family: Not on file  . Attends Religious Services: Not on file  . Active Member of Clubs or Organizations: Not on file  . Attends Banker Meetings: Not on file    . Marital Status: Not on file  Intimate Partner Violence:   . Fear of Current or Ex-Partner: Not on file  . Emotionally Abused: Not on file  . Physically Abused: Not on file  . Sexually Abused: Not on file    Allergies:  Allergies  Allergen Reactions  . Codeine Nausea Only and Nausea And Vomiting  . Shellfish Allergy Hives    hives  . Latex Rash    Medications: Prior to Admission medications   Medication Sig Start Date End Date Taking? Authorizing Provider  norethindrone (MICRONOR) 0.35 MG tablet Take 1 tablet (0.35 mg total) by mouth daily. 06/13/20  Yes Copland, Alicia B, PA-C  sertraline (ZOLOFT) 50 MG  tablet Take 1 tablet (50 mg total) by mouth daily. 10/23/18  Yes McLean-Scocuzza, Pasty Spillers, MD  valACYclovir (VALTREX) 500 MG tablet Take 1 tablet (500 mg total) by mouth daily. 03/06/20  Yes Copland, Ilona Sorrel, PA-C  etonogestrel (NEXPLANON) 68 MG IMPL implant 1 each (68 mg total) by Subdermal route once for 1 dose. 06/22/19 06/22/19  Copland, Ilona Sorrel, PA-C    Physical Exam Vitals: Blood pressure 136/82, pulse 66, height 5' (1.524 m), weight 135 lb (61.2 kg).  General: NAD HEENT: normocephalic, anicteric Neck: no thyroid enlargement, no palpable nodules, no cervical lymphadenopathy  Pulmonary: No increased work of breathing, CTAB Cardiovascular: RRR, without murmur  Breast: Breast symmetrical, no tenderness, no palpable nodules or masses, no skin or nipple retraction present, no nipple discharge.  No axillary, infraclavicular or supraclavicular lymphadenopathy. Abdomen: Soft, non-tender, non-distended.  Umbilicus without lesions.  No hepatomegaly or masses palpable. No evidence of hernia. Genitourinary:  External: Normal external female genitalia.Shaves entire escutcheon.  Normal urethral meatus, normal  Bartholin's and Skene's glands.    Vagina: Normal vaginal mucosa, no evidence of prolapse.    Cervix: Grossly normal in appearance, no bleeding, non-tender  Uterus: Anteverted,  normal size, shape, and consistency, mobile, and non-tender  Adnexa: No adnexal masses, non-tender  Rectal: deferred  Lymphatic: no evidence of inguinal lymphadenopathy Extremities: no edema, erythema, or tenderness Neurologic: Grossly intact Psychiatric: mood appropriate, affect full     Assessment: 29 y.o. [id number] No problem-specific Assessment & Plan notes found for this encounter.                  Annual Well Woman Exam- psp                   Contracepts with Nexplanon- requesting removal today- to start OCPs.                   GAD- for refill on Sertraline  Plan:  Well Woman PE complete  1) Breast cancer screening - recommend monthly self breast exam. not yet due  2) STI screening was offered and accepted. Blood drawn for HEP B, HIV and RPR. Additional STIs via the pap today.  3) Cervical cancer screening - Pap was done. ASCCP guidelines and rational discussed.  Patient opts for every 3 years screening interval  4) Contraception - Education given regarding options for contraception  5) Routine healthcare maintenance including cholesterol and diabetes screening managed by PCP.She is going to establish care with a new PCP. 5) Discussed her anxiety- she restarted taking Zoloft that she had used in the past. No reported side effects. Would like to continue- is presently also counseling with a professional to address anxiety. We contracted to RX her Sertraline 50 mg po daily. She will then return for a medication follow up in 3 months. The mechanism of action of SSRIs, side effects reviewed with her.  Mirna Mires, CNM  10/20/2020 3:22 PM

## 2020-10-21 ENCOUNTER — Other Ambulatory Visit: Payer: Self-pay | Admitting: Obstetrics

## 2020-10-21 LAB — HEP, RPR, HIV PANEL
HIV Screen 4th Generation wRfx: NONREACTIVE
Hepatitis B Surface Ag: NEGATIVE
RPR Ser Ql: REACTIVE — AB

## 2020-10-21 LAB — RPR, QUANT. (REFLEX): Rapid Plasma Reagin, Quant: 1:64 {titer} — ABNORMAL HIGH

## 2020-10-24 ENCOUNTER — Other Ambulatory Visit: Payer: Self-pay | Admitting: Obstetrics

## 2020-10-24 DIAGNOSIS — Z113 Encounter for screening for infections with a predominantly sexual mode of transmission: Secondary | ICD-10-CM

## 2020-10-24 DIAGNOSIS — A530 Latent syphilis, unspecified as early or late: Secondary | ICD-10-CM

## 2020-10-25 LAB — CYTOLOGY - PAP
Chlamydia: NEGATIVE
Comment: NEGATIVE
Comment: NEGATIVE
Comment: NEGATIVE
Comment: NORMAL
Diagnosis: NEGATIVE
Diagnosis: REACTIVE
High risk HPV: NEGATIVE
Neisseria Gonorrhea: NEGATIVE
Trichomonas: NEGATIVE

## 2020-10-25 NOTE — Progress Notes (Signed)
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  Have ordered T. Pallidum testing 10/23/2020. Will await those results , then discuss with patient.

## 2020-10-26 LAB — RPR, QUANT: RPR, Quant: 1:256 {titer} — ABNORMAL HIGH

## 2020-10-26 LAB — RPR TITER (REFLEX): RPR: REACTIVE — AB

## 2020-10-26 LAB — T PALLIDUM SCREENING CASCADE: T pallidum Antibodies (TP-PA): REACTIVE — AB

## 2020-10-26 LAB — SPECIMEN STATUS REPORT

## 2020-10-30 ENCOUNTER — Encounter: Payer: Self-pay | Admitting: Obstetrics

## 2020-10-30 NOTE — Progress Notes (Signed)
Called and spoke to the patient about her Positive RPR and T Pallidum testing. She is istructed to go to the [facility] for treatment.  She is uspet at the news, and shares that a former boyfriend  was cheating on her , and she broke up with him. Her current BF will need testing and treatment PRN.  She is aware of this.

## 2020-11-03 ENCOUNTER — Telehealth: Payer: Self-pay

## 2020-11-03 NOTE — Telephone Encounter (Signed)
Phone call attempted, left voicemail to return call.  Needs appt for RPR treatment.

## 2020-11-04 ENCOUNTER — Other Ambulatory Visit: Payer: Self-pay | Admitting: Obstetrics

## 2020-11-04 DIAGNOSIS — F32A Depression, unspecified: Secondary | ICD-10-CM

## 2020-11-04 DIAGNOSIS — F419 Anxiety disorder, unspecified: Secondary | ICD-10-CM

## 2021-01-19 ENCOUNTER — Encounter: Payer: Self-pay | Admitting: Obstetrics

## 2021-01-19 ENCOUNTER — Ambulatory Visit (INDEPENDENT_AMBULATORY_CARE_PROVIDER_SITE_OTHER): Payer: 59 | Admitting: Obstetrics

## 2021-01-19 ENCOUNTER — Ambulatory Visit: Payer: 59 | Admitting: Obstetrics

## 2021-01-19 ENCOUNTER — Other Ambulatory Visit: Payer: Self-pay

## 2021-01-19 VITALS — BP 100/80 | Ht 60.0 in | Wt 142.0 lb

## 2021-01-19 DIAGNOSIS — F32A Depression, unspecified: Secondary | ICD-10-CM | POA: Insufficient documentation

## 2021-01-19 DIAGNOSIS — F419 Anxiety disorder, unspecified: Secondary | ICD-10-CM | POA: Diagnosis not present

## 2021-01-19 DIAGNOSIS — Z30016 Encounter for initial prescription of transdermal patch hormonal contraceptive device: Secondary | ICD-10-CM | POA: Diagnosis not present

## 2021-01-19 MED ORDER — ESCITALOPRAM OXALATE 10 MG PO TABS: 10.0000 mg | ORAL_TABLET | Freq: Every day | ORAL | 3 refills | Status: DC

## 2021-01-19 MED ORDER — TWIRLA 120-30 MCG/24HR TD PTWK: MEDICATED_PATCH | TRANSDERMAL | 6 refills | Status: DC
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
## 2021-03-16 ENCOUNTER — Ambulatory Visit: Payer: 59 | Admitting: Obstetrics

## 2021-08-21 ENCOUNTER — Ambulatory Visit: Payer: 59 | Admitting: Obstetrics

## 2022-03-07 ENCOUNTER — Telehealth: Payer: Self-pay

## 2022-03-07 NOTE — Telephone Encounter (Signed)
Attempted to call pt re : Has not seen Dr Olivia Mackie since 2020. ?Overdue for follow up ? ?VM full ?

## 2022-03-30 ENCOUNTER — Emergency Department
Admission: EM | Admit: 2022-03-30 | Discharge: 2022-03-30 | Disposition: A | Discharge status: Home / Self Care | Payer: 59 | Attending: Emergency Medicine | Admitting: Emergency Medicine

## 2022-03-30 ENCOUNTER — Emergency Department: Payer: 59

## 2022-03-30 ENCOUNTER — Other Ambulatory Visit: Payer: Self-pay

## 2022-03-30 ENCOUNTER — Encounter: Payer: Self-pay | Admitting: Emergency Medicine

## 2022-03-30 DIAGNOSIS — N898 Other specified noninflammatory disorders of vagina: Secondary | ICD-10-CM | POA: Insufficient documentation

## 2022-03-30 DIAGNOSIS — O4690 Antepartum hemorrhage, unspecified, unspecified trimester: Secondary | ICD-10-CM

## 2022-03-30 DIAGNOSIS — O26899 Other specified pregnancy related conditions, unspecified trimester: Secondary | ICD-10-CM

## 2022-03-30 DIAGNOSIS — Z3A01 Less than 8 weeks gestation of pregnancy: Secondary | ICD-10-CM | POA: Diagnosis not present

## 2022-03-30 DIAGNOSIS — O209 Hemorrhage in early pregnancy, unspecified: Secondary | ICD-10-CM | POA: Diagnosis not present

## 2022-03-30 DIAGNOSIS — R350 Frequency of micturition: Secondary | ICD-10-CM | POA: Insufficient documentation

## 2022-03-30 LAB — CBC
HCT: 40 % (ref 36.0–46.0)
Hemoglobin: 13.5 g/dL (ref 12.0–15.0)
MCH: 30.7 pg (ref 26.0–34.0)
MCHC: 33.8 g/dL (ref 30.0–36.0)
MCV: 90.9 fL (ref 80.0–100.0)
Platelets: 402 10*3/uL — ABNORMAL HIGH (ref 150–400)
RBC: 4.4 MIL/uL (ref 3.87–5.11)
RDW: 11.9 % (ref 11.5–15.5)
WBC: 10.4 10*3/uL (ref 4.0–10.5)
nRBC: 0 % (ref 0.0–0.2)

## 2022-03-30 LAB — URINALYSIS, COMPLETE (UACMP) WITH MICROSCOPIC
Bilirubin Urine: NEGATIVE
Glucose, UA: NEGATIVE mg/dL
Ketones, ur: NEGATIVE mg/dL
Leukocytes,Ua: NEGATIVE
Nitrite: NEGATIVE
Protein, ur: NEGATIVE mg/dL
Specific Gravity, Urine: 1.011 (ref 1.005–1.030)
pH: 6 (ref 5.0–8.0)

## 2022-03-30 LAB — HCG, QUANTITATIVE, PREGNANCY: hCG, Beta Chain, Quant, S: 4314 m[IU]/mL — ABNORMAL HIGH (ref <5)

## 2022-03-30 LAB — BASIC METABOLIC PANEL
Anion gap: 6 (ref 5–15)
BUN: 8 mg/dL (ref 6–20)
CO2: 26 mmol/L (ref 22–32)
Calcium: 9.2 mg/dL (ref 8.9–10.3)
Chloride: 101 mmol/L (ref 98–111)
Creatinine, Ser: 0.72 mg/dL (ref 0.44–1.00)
GFR, Estimated: >60 mL/min (ref >60)
Glucose, Bld: 85 mg/dL (ref 70–99)
Potassium: 3.7 mmol/L (ref 3.5–5.1)
Sodium: 133 mmol/L — ABNORMAL LOW (ref 135–145)

## 2022-03-30 LAB — POC URINE PREG, ED: Preg Test, Ur: POSITIVE — AB

## 2022-03-30 LAB — ABO/RH: ABO/RH(D): A POS

## 2022-03-30 MED ORDER — ONDANSETRON 4 MG PO TBDP
4.0000 mg | ORAL_TABLET | Freq: Once | ORAL | Status: AC
Start: 2022-03-30 — End: 2022-03-30
  Administered 2022-03-30: 4 mg via ORAL
  Filled 2022-03-30: qty 1

## 2022-03-30 NOTE — Discharge Instructions (Signed)
Please have your beta-hCG and possible ultrasound rechecked in 48 hours. ?

## 2022-03-30 NOTE — ED Triage Notes (Signed)
Pt via POV from home. Pt c/o abd cramping and vaginal bleeding started yesterday only when she uses the restroom. Pt LMP she states was around the end of Feb unknown when it was exactly. Pt's pregnancy has not been confirmed by a doctor, pt had 2 positive pregnancy test at home. G3. Pt is A&OX4 and NAD ?

## 2022-03-30 NOTE — ED Provider Notes (Signed)
? ?[facility] ?Provider Note ? ?Patient Contact: 3:33 PM (approximate) ? ? ?History  ? ?Vaginal Bleeding ? ? ?HPI ? ?Megan Kramer is a 31 y.o. female G3, P2 approximately [redacted] weeks pregnant presents to the emergency department with vaginal bleeding for the past 2 days.  Patient reports that bleeding started as light pink vaginal discharge and has increased in frequency and is now bright red and moderately heavy without clots.  Patient states that she has had some dull cramps but denies current pain.  Denies recent sexual activity.  No fever or chills.  No increased urinary frequency or low back pain.  No similar symptoms in the past. ? ?  ? ? ?Physical Exam  ? ?Triage Vital Signs: ?ED Triage Vitals  ?Enc Vitals Group  ?   BP 03/30/22 1439 (!) 145/100  ?   Pulse Rate 03/30/22 1439 60  ?   Resp 03/30/22 1439 20  ?   Temp 03/30/22 1439 98.7 ?F (37.1 ?C)  ?   Temp Source 03/30/22 1439 Oral  ?   SpO2 03/30/22 1439 99 %  ?   Weight 03/30/22 1436 148 lb (67.1 kg)  ?   Height 03/30/22 1436 5' (1.524 m)  ?   Head Circumference --   ?   Peak Flow --   ?   Pain Score 03/30/22 1436 3  ?   Pain Loc --   ?   Pain Edu? --   ?   Excl. in [place]? --   ? ? ?Most recent vital signs: ?Vitals:  ? 03/30/22 1439  ?BP: (!) 145/100  ?Pulse: 60  ?Resp: 20  ?Temp: 98.7 ?F (37.1 ?C)  ?SpO2: 99%  ? ? ? ?General: Alert and in no acute distress. ?Eyes:  PERRL. EOMI. ?Head: No acute traumatic findings ?ENT: ?     Ears:  ?     Nose: No congestion/rhinnorhea. ?     Mouth/Throat: Mucous membranes are moist. ?Neck: No stridor. No cervical spine tenderness to palpation. ?Cardiovascular:  Good peripheral perfusion ?Respiratory: Normal respiratory effort without tachypnea or retractions. Lungs CTAB. Good air entry to the bases with no decreased or absent breath sounds. ?Gastrointestinal: Bowel sounds ?4 quadrants. Soft and nontender to palpation. No guarding or rigidity. No palpable masses. No distention. No CVA  tenderness. ?Musculoskeletal: Full range of motion to all extremities.  ?Neurologic:  No gross focal neurologic deficits are appreciated.  ?Skin:   No rash noted ? ? ? ?ED Results / Procedures / Treatments  ? ?Labs ?(all labs ordered are listed, but only abnormal results are displayed) ?Labs Reviewed  ?CBC - Abnormal; Notable for the following components:  ?    Result Value  ? Platelets 402 (*)   ? All other components within normal limits  ?HCG, QUANTITATIVE, PREGNANCY - Abnormal; Notable for the following components:  ? hCG, Beta Chain, Quant, S 4,314 (*)   ? All other components within normal limits  ?BASIC METABOLIC PANEL - Abnormal; Notable for the following components:  ? Sodium 133 (*)   ? All other components within normal limits  ?URINALYSIS, COMPLETE (UACMP) WITH MICROSCOPIC - Abnormal; Notable for the following components:  ? Color, Urine STRAW (*)   ? APPearance CLEAR (*)   ? Hgb urine dipstick MODERATE (*)   ? Bacteria, UA RARE (*)   ? All other components within normal limits  ?POC URINE PREG, ED - Abnormal; Notable for the following components:  ? Preg Test, Ur POSITIVE (*)   ?  All other components within normal limits  ?ABO/RH  ? ? ? ? ?RADIOLOGY ? ?I personally viewed and evaluated these images as part of my medical decision making, as well as reviewing the written report by the radiologist. ? ?ED Provider Interpretation: I personally reviewed ultrasound and agree with radiologist interpretation.  No fetal pole identified. ? ? ?PROCEDURES: ? ?Critical Care performed: No ? ?Procedures ? ? ?MEDICATIONS ORDERED IN ED: ?Medications  ?ondansetron (ZOFRAN-ODT) disintegrating tablet 4 mg (4 mg Oral Given 03/30/22 1719)  ? ? ? ?IMPRESSION / MDM / ASSESSMENT AND PLAN / ED COURSE  ?I reviewed the triage vital signs and the nursing notes. ?             ?               ? ?Assessment and plan ?Pregnancy of unknown location. ? ?Differential diagnosis includes, but is not limited to, first trimester vaginal  bleeding, threatened pregnancy, ectopic pregnancy... ? ?31 year old female G3 P2 presents to the emergency department with vaginal bleeding in pregnancy ? ?Urine pregnancy test was positive and beta-hCG was appropriately elevated.  CBC and BMP largely within reference range aside from very mild hyponatremia.  Patient was a positive and does not require RhoGAM. ? ?Dedicated OB ultrasound shows no fetal pole and findings are consistent with pregnancy of unknown location at this time.  I communicated results to patient and her husband and they anticipate having beta-hCG rechecked in 48 hours with possible repeat ultrasound.  I recommended returning to the emergency department for reevaluation if patient experiences worsening pain, fever or other new or worsening symptoms. ?  ? ? ?FINAL CLINICAL IMPRESSION(S) / ED DIAGNOSES  ? ?Final diagnoses:  ?Vaginal bleeding in pregnancy  ? ? ? ?Rx / DC Orders  ? ?ED Discharge Orders   ? ? None  ? ?  ? ? ? ?Note:  This document was prepared using Dragon voice recognition software and may include unintentional dictation errors. ?  ?Lannie Fields, PA-C ?03/30/22 1723 ? ?  ?Rada Hay, MD ?03/31/22 1947 ? ?

## 2022-04-01 ENCOUNTER — Emergency Department
Admission: EM | Admit: 2022-04-01 | Discharge: 2022-04-01 | Disposition: A | Discharge status: Home / Self Care | Payer: 59 | Attending: Emergency Medicine | Admitting: Emergency Medicine

## 2022-04-01 ENCOUNTER — Emergency Department: Payer: 59

## 2022-04-01 ENCOUNTER — Ambulatory Visit: Payer: 59 | Admitting: Licensed Practical Nurse

## 2022-04-01 ENCOUNTER — Other Ambulatory Visit: Payer: Self-pay

## 2022-04-01 DIAGNOSIS — O209 Hemorrhage in early pregnancy, unspecified: Secondary | ICD-10-CM

## 2022-04-01 DIAGNOSIS — Z3A01 Less than 8 weeks gestation of pregnancy: Secondary | ICD-10-CM | POA: Diagnosis not present

## 2022-04-01 DIAGNOSIS — O468X1 Other antepartum hemorrhage, first trimester: Secondary | ICD-10-CM

## 2022-04-01 DIAGNOSIS — O208 Other hemorrhage in early pregnancy: Secondary | ICD-10-CM | POA: Diagnosis not present

## 2022-04-01 LAB — HCG, QUANTITATIVE, PREGNANCY: hCG, Beta Chain, Quant, S: 7368 m[IU]/mL — ABNORMAL HIGH (ref <5)

## 2022-04-01 MED ORDER — ONDANSETRON 4 MG PO TBDP: 4.0000 mg | ORAL_TABLET | Freq: Three times a day (TID) | ORAL | 0 refills | Status: DC | PRN

## 2022-04-01 NOTE — ED Triage Notes (Signed)
Pt states she was here on Saturday with light vaginal bleeding and told to follow up with OB today if not come back to be rechecked, pt continues to have light bleeding. Pt is in NAD ?

## 2022-04-01 NOTE — ED Provider Notes (Signed)
? ?[facility] ?Provider Note ? ? ? Event Date/Time  ? First MD Initiated Contact with Patient 04/01/22 1250   ?  (approximate) ? ? ?History  ? ?Follow-up ? ? ?HPI ? ?Megan Kramer is a 31 y.o. female with no significant past medical history and as listed in EMR presents to the emergency department for repeat hCG and ultrasound.  She was evaluated here on 03/30/2022 for vaginal bleeding in pregnancy and advised to see GYN or return here in 2 days. She continues to have light spotting. No abdominal pain/cramping today.  ? ?  ? ? ?Physical Exam  ? ?Triage Vital Signs: ?ED Triage Vitals  ?Enc Vitals Group  ?   BP 04/01/22 1232 (!) 144/95  ?   Pulse Rate 04/01/22 1232 65  ?   Resp 04/01/22 1232 20  ?   Temp 04/01/22 1232 98.9 ?F (37.2 ?C)  ?   Temp Source 04/01/22 1232 Oral  ?   SpO2 04/01/22 1232 99 %  ?   Weight 04/01/22 1252 147 lb 14.9 oz (67.1 kg)  ?   Height 04/01/22 1252 5' (1.524 m)  ?   Head Circumference --   ?   Peak Flow --   ?   Pain Score --   ?   Pain Loc --   ?   Pain Edu? --   ?   Excl. in [place]? --   ? ? ?Most recent vital signs: ?Vitals:  ? 04/01/22 1232 04/01/22 1600  ?BP: (!) 144/95 (!) 145/85  ?Pulse: 65   ?Resp: 20   ?Temp: 98.9 ?F (37.2 ?C)   ?SpO2: 99%   ? ? ?General: Awake, no distress.  ?CV:  Good peripheral perfusion.  ?Resp:  Normal effort.  ?Abd:  No distention.  ?Other:   ? ? ?ED Results / Procedures / Treatments  ? ?Labs ?(all labs ordered are listed, but only abnormal results are displayed) ?Labs Reviewed  ?HCG, QUANTITATIVE, PREGNANCY - Abnormal; Notable for the following components:  ?    Result Value  ? hCG, Beta Chain, Quant, S 7,368 (*)   ? All other components within normal limits  ? ? ? ?EKG ? ?Not indicated. ? ? ?RADIOLOGY ? ?Image and radiology report reviewed by me. ? ?Single IUP, estimated 5 weeks 5 days, FHR 78 ? ?PROCEDURES: ? ?Critical Care performed: No ? ?Procedures ? ? ?MEDICATIONS ORDERED IN ED: ?Medications - No data to display ? ? ?IMPRESSION /  MDM / ASSESSMENT AND PLAN / ED COURSE  ? ?I have reviewed the triage note. ? ?Differential diagnosis includes, but is not limited to: Vaginal bleeding in pregnancy, ectopic pregnancy, subchorionic hematoma. ? ?31 year old female presents to the ER for evaluation of vaginal bleeding in pregnancy. She is here for repeat labs and ultrasound.  ? ?[place] does show IUP with fetal bradycardia. Results discussed with patient and significant other. She will need to have close follow up with OB/GYN. ER return precautions discussed as well.  ? ?  ? ? ?FINAL CLINICAL IMPRESSION(S) / ED DIAGNOSES  ? ?Final diagnoses:  ?Vaginal bleeding in pregnancy, first trimester  ?Subchorionic hematoma in first trimester, single or unspecified fetus  ? ? ? ?Rx / DC Orders  ? ?ED Discharge Orders   ? ?      Ordered  ?  ondansetron (ZOFRAN-ODT) 4 MG disintegrating tablet  Every 8 hours PRN       ? 04/01/22 1549  ? ?  ?  ? ?  ? ? ? ?  Note:  This document was prepared using Dragon voice recognition software and may include unintentional dictation errors. ?  ?Chinita Pester, FNP ?04/02/22 1008 ? ?  ?Jene Every, MD ?04/02/22 1605 ? ?

## 2022-04-01 NOTE — ED Notes (Signed)
Dc ppw provided. Pt denies any questions or concerns at this time. RX information provided. Pt assisted off unit with husband ?

## 2022-04-04 ENCOUNTER — Encounter: Payer: 59 | Admitting: Licensed Practical Nurse

## 2022-04-18 ENCOUNTER — Other Ambulatory Visit: Payer: Self-pay

## 2022-04-18 ENCOUNTER — Emergency Department
Admission: EM | Admit: 2022-04-18 | Discharge: 2022-04-18 | Disposition: A | Discharge status: Home / Self Care | Payer: 59 | Attending: Emergency Medicine | Admitting: Emergency Medicine

## 2022-04-18 ENCOUNTER — Encounter: Payer: Self-pay | Admitting: Emergency Medicine

## 2022-04-18 DIAGNOSIS — D72829 Elevated white blood cell count, unspecified: Secondary | ICD-10-CM | POA: Insufficient documentation

## 2022-04-18 DIAGNOSIS — O210 Mild hyperemesis gravidarum: Secondary | ICD-10-CM | POA: Insufficient documentation

## 2022-04-18 DIAGNOSIS — R110 Nausea: Secondary | ICD-10-CM | POA: Insufficient documentation

## 2022-04-18 DIAGNOSIS — E860 Dehydration: Secondary | ICD-10-CM | POA: Insufficient documentation

## 2022-04-18 DIAGNOSIS — Z3A08 8 weeks gestation of pregnancy: Secondary | ICD-10-CM | POA: Insufficient documentation

## 2022-04-18 LAB — CBC
HCT: 40.3 % (ref 36.0–46.0)
Hemoglobin: 13.5 g/dL (ref 12.0–15.0)
MCH: 30.8 pg (ref 26.0–34.0)
MCHC: 33.5 g/dL (ref 30.0–36.0)
MCV: 91.8 fL (ref 80.0–100.0)
Platelets: 397 10*3/uL (ref 150–400)
RBC: 4.39 MIL/uL (ref 3.87–5.11)
RDW: 12.1 % (ref 11.5–15.5)
WBC: 13.2 10*3/uL — ABNORMAL HIGH (ref 4.0–10.5)
nRBC: 0 % (ref 0.0–0.2)

## 2022-04-18 LAB — COMPREHENSIVE METABOLIC PANEL
ALT: 31 U/L (ref 0–44)
AST: 30 U/L (ref 15–41)
Albumin: 4.2 g/dL (ref 3.5–5.0)
Alkaline Phosphatase: 71 U/L (ref 38–126)
Anion gap: 9 (ref 5–15)
BUN: 8 mg/dL (ref 6–20)
CO2: 25 mmol/L (ref 22–32)
Calcium: 9 mg/dL (ref 8.9–10.3)
Chloride: 100 mmol/L (ref 98–111)
Creatinine, Ser: 0.47 mg/dL (ref 0.44–1.00)
GFR, Estimated: >60 mL/min (ref >60)
Glucose, Bld: 88 mg/dL (ref 70–99)
Potassium: 3.7 mmol/L (ref 3.5–5.1)
Sodium: 134 mmol/L — ABNORMAL LOW (ref 135–145)
Total Bilirubin: 0.7 mg/dL (ref 0.3–1.2)
Total Protein: 7.8 g/dL (ref 6.5–8.1)

## 2022-04-18 LAB — URINALYSIS, ROUTINE W REFLEX MICROSCOPIC
Bilirubin Urine: NEGATIVE
Glucose, UA: NEGATIVE mg/dL
Hgb urine dipstick: NEGATIVE
Ketones, ur: 20 mg/dL — AB
Leukocytes,Ua: NEGATIVE
Nitrite: NEGATIVE
Protein, ur: NEGATIVE mg/dL
Specific Gravity, Urine: 1.027 (ref 1.005–1.030)
pH: 5 (ref 5.0–8.0)

## 2022-04-18 LAB — LIPASE, BLOOD: Lipase: 32 U/L (ref 11–51)

## 2022-04-18 LAB — POC URINE PREG, ED: Preg Test, Ur: POSITIVE — AB

## 2022-04-18 MED ORDER — SODIUM CHLORIDE 0.9 % IV BOLUS
1000.0000 mL | Freq: Once | INTRAVENOUS | Status: AC
  Administered 2022-04-18: 1000 mL via INTRAVENOUS

## 2022-04-18 MED ORDER — METOCLOPRAMIDE HCL 5 MG/ML IJ SOLN
10.0000 mg | Freq: Once | INTRAMUSCULAR | Status: AC
  Administered 2022-04-18: 10 mg via INTRAVENOUS
  Filled 2022-04-18: qty 2

## 2022-04-18 MED ORDER — ACETAMINOPHEN 325 MG PO TABS
650.0000 mg | ORAL_TABLET | Freq: Once | ORAL | Status: AC
  Administered 2022-04-18: 650 mg via ORAL
  Filled 2022-04-18: qty 2

## 2022-04-18 MED ORDER — PROMETHAZINE HCL 25 MG RE SUPP: 25.0000 mg | Freq: Four times a day (QID) | RECTAL | 0 refills | Status: DC | PRN

## 2022-04-18 MED ORDER — DOXYLAMINE-PYRIDOXINE 10-10 MG PO TBEC: DELAYED_RELEASE_TABLET | ORAL | 1 refills | Status: DC

## 2022-04-18 NOTE — ED Notes (Signed)
Patient reports have a small emesis via emesis bag. Emesis bag was discarded by this RN. ?

## 2022-04-18 NOTE — ED Notes (Signed)
E signature pad not working. Pt educated on discharge instructions and verbalized understanding.  

## 2022-04-18 NOTE — ED Provider Notes (Signed)
? ?[facility] ?Provider Note ? ? ? Event Date/Time  ? First MD Initiated Contact with Patient 04/18/22 1439   ?  (approximate) ? ? ?History  ? ?Emesis ? ? ?HPI ? ?Megan Kramer is a 31 y.o. female who is G3P202 presents emergency department with nausea and vomiting.  Patient states she is about [redacted] weeks pregnant and has been severely sick.  States that is from the pregnancy.  Cannot hold any food and fluids down.  Knows she is getting dehydrated.  Patient states she has been on Zofran and Phenergan without any relief.  Denies abdominal pain, cramping, bleeding or dysuria. ? ?  ? ? ?Physical Exam  ? ?Triage Vital Signs: ?ED Triage Vitals [04/18/22 1327]  ?Enc Vitals Group  ?   BP   ?   Pulse   ?   Resp   ?   Temp   ?   Temp src   ?   SpO2   ?   Weight 147 lb 14.9 oz (67.1 kg)  ?   Height 5' (1.524 m)  ?   Head Circumference   ?   Peak Flow   ?   Pain Score 0  ?   Pain Loc   ?   Pain Edu?   ?   Excl. in [place]?   ? ? ?Most recent vital signs: ?Vitals:  ? 04/18/22 1658  ?BP: 135/86  ?Pulse: 65  ?Resp: 18  ?Temp: 98.3 ?F (36.8 ?C)  ?SpO2: 99%  ? ? ? ?General: Awake, no distress.   ?CV:  Good peripheral perfusion. regular rate and  rhythm ?Resp:  Normal effort. Lungs CTA ?Abd:  No distention.  Nontender ?Other:    ? ? ?ED Results / Procedures / Treatments  ? ?Labs ?(all labs ordered are listed, but only abnormal results are displayed) ?Labs Reviewed  ?COMPREHENSIVE METABOLIC PANEL - Abnormal; Notable for the following components:  ?    Result Value  ? Sodium 134 (*)   ? All other components within normal limits  ?CBC - Abnormal; Notable for the following components:  ? WBC 13.2 (*)   ? All other components within normal limits  ?URINALYSIS, ROUTINE W REFLEX MICROSCOPIC - Abnormal; Notable for the following components:  ? Color, Urine YELLOW (*)   ? APPearance CLEAR (*)   ? Ketones, ur 20 (*)   ? All other components within normal limits  ?POC URINE PREG, ED - Abnormal; Notable for the following  components:  ? Preg Test, Ur POSITIVE (*)   ? All other components within normal limits  ?LIPASE, BLOOD  ? ? ? ?EKG ? ? ? ? ?RADIOLOGY ? ? ? ? ?PROCEDURES: ? ? ?Procedures ? ? ?MEDICATIONS ORDERED IN ED: ?Medications  ?sodium chloride 0.9 % bolus 1,000 mL (0 mLs Intravenous Stopped 04/18/22 1658)  ?metoCLOPramide (REGLAN) injection 10 mg (10 mg Intravenous Given 04/18/22 1542)  ?sodium chloride 0.9 % bolus 1,000 mL (1,000 mLs Intravenous New Bag/Given 04/18/22 1754)  ?acetaminophen (TYLENOL) tablet 650 mg (650 mg Oral Given 04/18/22 1753)  ? ? ? ?IMPRESSION / MDM / ASSESSMENT AND PLAN / ED COURSE  ?I reviewed the triage vital signs and the nursing notes. ?             ?               ? ?Differential diagnosis includes, but is not limited to, hyperemesis gravida, acute cholecystitis, gastroenteritis, vomiting in early pregnancy ? ?I  do not feel the patient has acute cholecystitis as she has had her gallbladder removed.  Do not feel that she has acute gastroenteritis as she has no diarrhea just vomiting.  With her being in her first trimester this would be typical reaction due to hormones.  Patient has reassuring labs.  The patient CBC has a mildly elevated WBC of 13.2, her lipase is normal, comprehensive metabolic panel is normal, POC pregnancy is positive.  Urinalysis does show 20 ketones. ? ?Since her labs are reassuring do not feel that the patient needs any ultrasounds or imaging.  She is having no abdominal pain.  Will give her normal saline 1 L IV along with Reglan 10 mg IV.  If she has improvement with this we will discharge.  If she still feels dehydrated will add a second liter of normal saline. ? ?Patient states she feels somewhat better but still feels like she needs more fluids.  We will give her second liter of normal saline she is also complaining of a headache so we will give her Tylenol. ? ?Patient continues to have vomiting.  States she had 2 episodes of vomiting while here after medications.  Explained  to her that Phenergan IV is on backorder.  She is agreeable to being discharged with prescription for Phenergan suppositories.  She does not want to try this at home.  She did question me on whether Dramamine would help but she has good results with this when she is not pregnant.  Splane to her it be safe for her to take Dramamine.  She can also try Diclegis.  She is to follow-up with her OB/GYN.  If she becomes dehydrated she is to return the emergency department.  She was discharged stable condition in the care of her husband. ? ? ?  ? ? ?FINAL CLINICAL IMPRESSION(S) / ED DIAGNOSES  ? ?Final diagnoses:  ?Hyperemesis arising during pregnancy  ? ? ? ?Rx / DC Orders  ? ?ED Discharge Orders   ? ?      Ordered  ?  promethazine (PHENERGAN) 25 MG suppository  Every 6 hours PRN       ? 04/18/22 1910  ?  Doxylamine-Pyridoxine 10-10 MG TBEC       ? 04/18/22 1910  ? ?  ?  ? ?  ? ? ? ?Note:  This document was prepared using Dragon voice recognition software and may include unintentional dictation errors. ? ?  ?Faythe Ghee, PA-C ?04/18/22 1917 ? ?  ?Shaune Pollack, MD ?04/19/22 1648 ? ?

## 2022-04-18 NOTE — Discharge Instructions (Signed)
Follow-up with [facility] OB/GYN. ?Try the Diclegis just as instructed.  Phenergan suppositories if not successful with Diclegis. ?Dramamine is appropriate for morning sickness ?

## 2022-04-18 NOTE — ED Triage Notes (Signed)
Pt comes into the ED via POV c/o emesis.  Pt is currently [redacted] weeks pregnant and has not been able to stop having N/V.  Pt states her MD's have tried zofran and phenergan with no relief.  Pt states she has had 8-9 episodes of emesis in the last 24 hours.  Pt ambulatory and in NAd with even and unlabored respirations.  ?

## 2022-04-22 DIAGNOSIS — O0993 Supervision of high risk pregnancy, unspecified, third trimester: Secondary | ICD-10-CM | POA: Insufficient documentation

## 2022-05-09 ENCOUNTER — Encounter: Payer: Self-pay | Admitting: Obstetrics and Gynecology

## 2022-05-09 ENCOUNTER — Other Ambulatory Visit: Payer: Self-pay

## 2022-05-09 ENCOUNTER — Observation Stay
Admission: AD | Admit: 2022-05-09 | Discharge: 2022-05-10 | Disposition: A | Discharge status: Home / Self Care | Payer: 59 | Source: Ambulatory Visit

## 2022-05-09 DIAGNOSIS — O210 Mild hyperemesis gravidarum: Secondary | ICD-10-CM | POA: Diagnosis present

## 2022-05-09 DIAGNOSIS — Z3A11 11 weeks gestation of pregnancy: Secondary | ICD-10-CM | POA: Insufficient documentation

## 2022-05-09 DIAGNOSIS — Z91040 Latex allergy status: Secondary | ICD-10-CM | POA: Diagnosis not present

## 2022-05-09 DIAGNOSIS — J45909 Unspecified asthma, uncomplicated: Secondary | ICD-10-CM | POA: Diagnosis not present

## 2022-05-09 DIAGNOSIS — Z79899 Other long term (current) drug therapy: Secondary | ICD-10-CM | POA: Insufficient documentation

## 2022-05-09 DIAGNOSIS — Z98891 History of uterine scar from previous surgery: Secondary | ICD-10-CM | POA: Diagnosis not present

## 2022-05-09 DIAGNOSIS — O10011 Pre-existing essential hypertension complicating pregnancy, first trimester: Secondary | ICD-10-CM | POA: Diagnosis not present

## 2022-05-09 DIAGNOSIS — O99511 Diseases of the respiratory system complicating pregnancy, first trimester: Secondary | ICD-10-CM | POA: Insufficient documentation

## 2022-05-09 LAB — URINE DRUG SCREEN, QUALITATIVE (ARMC ONLY)
Amphetamines, Ur Screen: NOT DETECTED
Barbiturates, Ur Screen: NOT DETECTED
Benzodiazepine, Ur Scrn: NOT DETECTED
Cannabinoid 50 Ng, Ur ~~LOC~~: POSITIVE — AB
Cocaine Metabolite,Ur ~~LOC~~: NOT DETECTED
MDMA (Ecstasy)Ur Screen: NOT DETECTED
Methadone Scn, Ur: NOT DETECTED
Opiate, Ur Screen: NOT DETECTED
Phencyclidine (PCP) Ur S: NOT DETECTED
Tricyclic, Ur Screen: NOT DETECTED

## 2022-05-09 LAB — COMPREHENSIVE METABOLIC PANEL
ALT: 17 U/L (ref 0–44)
AST: 19 U/L (ref 15–41)
Albumin: 3.4 g/dL — ABNORMAL LOW (ref 3.5–5.0)
Alkaline Phosphatase: 57 U/L (ref 38–126)
Anion gap: 8 (ref 5–15)
BUN: 7 mg/dL (ref 6–20)
CO2: 24 mmol/L (ref 22–32)
Calcium: 8.5 mg/dL — ABNORMAL LOW (ref 8.9–10.3)
Chloride: 102 mmol/L (ref 98–111)
Creatinine, Ser: 0.43 mg/dL — ABNORMAL LOW (ref 0.44–1.00)
GFR, Estimated: >60 mL/min (ref >60)
Glucose, Bld: 89 mg/dL (ref 70–99)
Potassium: 3.4 mmol/L — ABNORMAL LOW (ref 3.5–5.1)
Sodium: 134 mmol/L — ABNORMAL LOW (ref 135–145)
Total Bilirubin: 0.5 mg/dL (ref 0.3–1.2)
Total Protein: 6.6 g/dL (ref 6.5–8.1)

## 2022-05-09 LAB — CBC
HCT: 34.5 % — ABNORMAL LOW (ref 36.0–46.0)
Hemoglobin: 12 g/dL (ref 12.0–15.0)
MCH: 31.2 pg (ref 26.0–34.0)
MCHC: 34.8 g/dL (ref 30.0–36.0)
MCV: 89.6 fL (ref 80.0–100.0)
Platelets: 364 10*3/uL (ref 150–400)
RBC: 3.85 MIL/uL — ABNORMAL LOW (ref 3.87–5.11)
RDW: 12.4 % (ref 11.5–15.5)
WBC: 9.2 10*3/uL (ref 4.0–10.5)
nRBC: 0 % (ref 0.0–0.2)

## 2022-05-09 LAB — URINALYSIS, ROUTINE W REFLEX MICROSCOPIC
Bilirubin Urine: NEGATIVE
Glucose, UA: NEGATIVE mg/dL
Hgb urine dipstick: NEGATIVE
Ketones, ur: 80 mg/dL — AB
Leukocytes,Ua: NEGATIVE
Nitrite: NEGATIVE
Protein, ur: NEGATIVE mg/dL
Specific Gravity, Urine: 1.012 (ref 1.005–1.030)
pH: 6 (ref 5.0–8.0)

## 2022-05-09 LAB — MAGNESIUM: Magnesium: 1.8 mg/dL (ref 1.7–2.4)

## 2022-05-09 MED ORDER — LACTATED RINGERS IV BOLUS
1000.0000 mL | INTRAVENOUS | Status: DC
  Administered 2022-05-09 (×2): 1000 mL via INTRAVENOUS

## 2022-05-09 MED ORDER — METOCLOPRAMIDE HCL 5 MG/ML IJ SOLN
10.0000 mg | Freq: Four times a day (QID) | INTRAMUSCULAR | Status: DC
  Administered 2022-05-09 (×2): 10 mg via INTRAVENOUS
  Filled 2022-05-09 (×2): qty 2

## 2022-05-09 MED ORDER — SODIUM CHLORIDE 0.9 % IV SOLN
8.0000 mg | Freq: Three times a day (TID) | INTRAVENOUS | Status: DC | PRN
  Filled 2022-05-09: qty 4

## 2022-05-09 MED ORDER — FAMOTIDINE 20 MG PO TABS
20.0000 mg | ORAL_TABLET | Freq: Two times a day (BID) | ORAL | Status: DC
  Administered 2022-05-09 – 2022-05-10 (×3): 20 mg via ORAL
  Filled 2022-05-09 (×3): qty 1

## 2022-05-09 MED ORDER — M.V.I. ADULT IV INJ
INJECTION | Freq: Once | INTRAVENOUS | Status: DC
  Filled 2022-05-09: qty 1000

## 2022-05-09 MED ORDER — THIAMINE HCL 100 MG/ML IJ SOLN
INTRAVENOUS | Status: AC
  Filled 2022-05-09: qty 1000

## 2022-05-09 MED ORDER — ONDANSETRON HCL 4 MG/2ML IJ SOLN
4.0000 mg | Freq: Three times a day (TID) | INTRAMUSCULAR | Status: DC | PRN
  Administered 2022-05-09: 4 mg via INTRAVENOUS
  Filled 2022-05-09: qty 2

## 2022-05-09 MED ORDER — FAMOTIDINE IN NACL 20-0.9 MG/50ML-% IV SOLN
20.0000 mg | Freq: Two times a day (BID) | INTRAVENOUS | Status: DC
  Filled 2022-05-09 (×3): qty 50

## 2022-05-09 MED ORDER — PROMETHAZINE HCL 25 MG RE SUPP: 12.5000 mg | RECTAL | Status: DC | PRN

## 2022-05-09 MED ORDER — HYDROXYZINE HCL 25 MG PO TABS
50.0000 mg | ORAL_TABLET | Freq: Four times a day (QID) | ORAL | Status: DC | PRN
  Administered 2022-05-09: 50 mg via ORAL
  Filled 2022-05-09: qty 2

## 2022-05-09 MED ORDER — HYDROXYZINE HCL 50 MG/ML IM SOLN: 50.0000 mg | Freq: Four times a day (QID) | INTRAMUSCULAR | Status: DC | PRN

## 2022-05-09 MED ORDER — PROMETHAZINE HCL 25 MG PO TABS: 12.5000 mg | ORAL_TABLET | ORAL | Status: DC | PRN

## 2022-05-09 MED ORDER — LACTATED RINGERS IV SOLN: INTRAVENOUS | Status: DC

## 2022-05-09 MED ORDER — METOCLOPRAMIDE HCL 10 MG PO TABS
10.0000 mg | ORAL_TABLET | Freq: Four times a day (QID) | ORAL | Status: DC
  Administered 2022-05-10 (×3): 10 mg via ORAL
  Filled 2022-05-09 (×3): qty 1

## 2022-05-09 MED ORDER — ONDANSETRON 4 MG PO TBDP
4.0000 mg | ORAL_TABLET | Freq: Three times a day (TID) | ORAL | Status: DC | PRN
  Administered 2022-05-10 (×3): 8 mg via ORAL
  Filled 2022-05-09 (×3): qty 2

## 2022-05-09 NOTE — H&P (Signed)
HISTORY AND PHYSICAL NOTE  History of Present Illness: Megan Kramer is a 31 y.o. G3P2002 at [redacted]w[redacted]d admitted for intractable nausea and vomiting in pregnancy.  She called the office with complaints of unremitting N/V despite reglan, phenergan, and zofran.  She has been unable to successfully keep food or liquids down for 24 hours.  She has tried Zofran, reglan, phenergan, and diclegis to help with N/V but has not been effective.  She denies cramping, vaginal bleeding, or LOF.  Has not yet been able to feel fetal movement yet.   Factors complicating pregnancy:  Chlamydia in pregnancy - will need TOC in 2 weeks  History of preeclampsia x 2  Hyperemesis Gravidarum  HSV-2 Previous c/section x 2  Prenatal care site:  [facility]  Patient Active Problem List   Diagnosis Date Noted   Hyperemesis complicating pregnancy, antepartum 05/09/2022   Hyperemesis gravidarum 05/09/2022   Anxiety and depression 01/19/2021   Chronic cholecystitis without calculus 05/12/2018   IBS (irritable bowel syndrome) 01/26/2018   HLD (hyperlipidemia) 01/26/2018   Kidney stones 05/09/2017   Flank pain    Previous cesarean section 03/14/2017   Herpes genitalis 03/14/2017   Anxiety 03/14/2017    Past Medical History:  Diagnosis Date   Anxiety    Asthma    EXERCISE INDUCED-NO INHALER NOW   Chronic cholecystitis without calculus    Depression    Dysrhythmia    TACHYCARDIA WITH 1ST PREGNANCY-ECHO DONE AND WAS WNL-ASYMPTOMATIC NOW   Family history of adverse reaction to anesthesia    MOM- N/V   Genital herpes    GERD (gastroesophageal reflux disease)    H/O WITH PREGNANCY   Heart murmur    AS A CHILD-ASYMPTOMATIC   History of kidney stones    Hyperlipidemia    Hypertension    pregnancy induced   IBS (irritable bowel syndrome)    Obesity (BMI 30-39.9)    Postpartum depression associated with first pregnancy     Past Surgical History:  Procedure Laterality Date   CESAREAN SECTION   12/21/2014   Teresa [place], [place]   CESAREAN SECTION N/A 05/27/2017   Procedure: CESAREAN SECTION;  Surgeon: Nadara Mustard, MD;  Location: [facility] ORS;  Service: Obstetrics;  Laterality: N/A;   CHOLECYSTECTOMY N/A 05/15/2018   Procedure: LAPAROSCOPIC CHOLECYSTECTOMY WITH INTRAOPERATIVE CHOLANGIOGRAM;  Surgeon: Earline Mayotte, MD;  Location: [facility] ORS;  Service: General;  Laterality: N/A;   CYSTOSCOPY/URETEROSCOPY/HOLMIUM LASER/STENT PLACEMENT Right 02/27/2018   Procedure: CYSTOSCOPY/URETEROSCOPY/HOLMIUM LASER/STENT PLACEMENT;  Surgeon: Riki Altes, MD;  Location: [facility] ORS;  Service: Urology;  Laterality: Right;   OTHER SURGICAL HISTORY     mole bx    WISDOM TOOTH EXTRACTION      OB History  Gravida Para Term Preterm AB Living  3 2 2     2   SAB IAB Ectopic Multiple Live Births          2    # Outcome Date GA Lbr Len/2nd Weight Sex Delivery Anes PTL Lv  3 Current           2 Term 05/27/17 [redacted]w[redacted]d  3462 g M CS-LTranv  N LIV     Complications: Preeclampsia, third trimester  1 Term 12/21/14 [redacted]w[redacted]d  3572 g F CS-LTranv  N LIV     Complications: Herpes genitalia, Severe preeclampsia    Obstetric Comments  Preeclampsia with G1 and 2  1st Menstrual Cycle:  8   1st Pregnancy:  45    Social History:  reports  that she has never smoked. She has never used smokeless tobacco. She reports current alcohol use. She reports that she does not use drugs.  Family History: family history includes Arthritis in her paternal grandfather; Breast cancer in her maternal grandmother; Crohn's disease in her paternal aunt; Depression in her mother; Diabetes in her maternal grandfather and another family member; Diverticulosis in her mother; GER disease in her paternal aunt; Hearing loss in her father; Heart disease in her maternal grandfather; Hyperlipidemia in her father and mother; Hypertension in her father and mother; Irritable bowel syndrome in her father and paternal aunt; Miscarriages / Stillbirths in her  maternal grandmother.  Allergies  Allergen Reactions   Codeine Nausea Only and Nausea And Vomiting   Shellfish Allergy Hives    hives   Latex Rash    Medications Prior to Admission  Medication Sig Dispense Refill Last Dose   Doxylamine-Pyridoxine 10-10 MG TBEC Use 1 tablet at nighttime for 2 days.  If nausea and vomiting continues increased to 1 in the morning and 1 in the evening. 60 tablet 1    escitalopram (LEXAPRO) 10 MG tablet Take 1 tablet (10 mg total) by mouth daily. 30 tablet 3    ondansetron (ZOFRAN-ODT) 4 MG disintegrating tablet Take 1 tablet (4 mg total) by mouth every 8 (eight) hours as needed for nausea or vomiting. 20 tablet 0    promethazine (PHENERGAN) 25 MG suppository Place 1 suppository (25 mg total) rectally every 6 (six) hours as needed for nausea or vomiting. 12 each 0    promethazine (PHENERGAN) 25 MG tablet Take 25 mg by mouth every 6 (six) hours as needed.      pyridOXINE (VITAMIN B-6) 25 MG tablet Take 1 tablet by mouth 3 (three) times daily.      valACYclovir (VALTREX) 500 MG tablet Take 1 tablet (500 mg total) by mouth daily. 90 tablet 0     ROS  Physical Examination: Vitals:  BP (!) 146/86 (BP Location: Left Arm) Comment: nurse Kendra notified  Pulse 73   Temp 98.4 F (36.9 C) (Oral)   Resp 18   Ht 5' (1.524 m)   Wt 66.2 kg   SpO2 100% Comment: Room Air  BMI 28.51 kg/m  General: no acute distress.  HEENT: normocephalic, atraumatic Heart: regular rate & rhythm.  No murmurs/rubs/gallops Lungs: clear to auscultation bilaterally, normal respiratory effort Abdomen: soft, gravid, c/w with 10-[redacted] week gestation, non-tender  Pelvic: deferred  Extremities: non-tender, symmetric, no edema bilaterally.  DTRs: 2+/2+  Neurologic: Alert & oriented x 3.    FHT by doppler:  134 bpm distinguished from maternal pulse   Labs:  Results for orders placed or performed during the hospital encounter of 05/09/22 (from the past 24 hour(s))  CBC   Collection Time:  05/09/22 12:22 PM  Result Value Ref Range   WBC 9.2 4.0 - 10.5 K/uL   RBC 3.85 (L) 3.87 - 5.11 MIL/uL   Hemoglobin 12.0 12.0 - 15.0 g/dL   HCT 00.1 (L) 74.9 - 44.9 %   MCV 89.6 80.0 - 100.0 fL   MCH 31.2 26.0 - 34.0 pg   MCHC 34.8 30.0 - 36.0 g/dL   RDW 67.5 91.6 - 38.4 %   Platelets 364 150 - 400 K/uL   nRBC 0.0 0.0 - 0.2 %  Comprehensive metabolic panel   Collection Time: 05/09/22 12:22 PM  Result Value Ref Range   Sodium 134 (L) 135 - 145 mmol/L   Potassium 3.4 (L) 3.5 - 5.1  mmol/L   Chloride 102 98 - 111 mmol/L   CO2 24 22 - 32 mmol/L   Glucose, Bld 89 70 - 99 mg/dL   BUN 7 6 - 20 mg/dL   Creatinine, Ser 1.610.43 (L) 0.44 - 1.00 mg/dL   Calcium 8.5 (L) 8.9 - 10.3 mg/dL   Total Protein 6.6 6.5 - 8.1 g/dL   Albumin 3.4 (L) 3.5 - 5.0 g/dL   AST 19 15 - 41 U/L   ALT 17 0 - 44 U/L   Alkaline Phosphatase 57 38 - 126 U/L   Total Bilirubin 0.5 0.3 - 1.2 mg/dL   GFR, Estimated >09>60 >60>60 mL/min   Anion gap 8 5 - 15  Magnesium   Collection Time: 05/09/22 12:22 PM  Result Value Ref Range   Magnesium 1.8 1.7 - 2.4 mg/dL    Prenatal Labs: Blood type/Rh A pos  Antibody screen neg  Rubella Immune  Varicella Immune  RPR NR  HBsAg Neg  Hep C  NR  HIV NR  GC neg  Chlamydia neg  1 hour GTT Not due  3 hour GTT N/A  GBS N/A    Imaging Studies: No results found.  Assessment and Plan: Patient Active Problem List   Diagnosis Date Noted   Hyperemesis complicating pregnancy, antepartum 05/09/2022   Hyperemesis gravidarum 05/09/2022   Anxiety and depression 01/19/2021   Chronic cholecystitis without calculus 05/12/2018   IBS (irritable bowel syndrome) 01/26/2018   HLD (hyperlipidemia) 01/26/2018   Kidney stones 05/09/2017   Flank pain    Previous cesarean section 03/14/2017   Herpes genitalis 03/14/2017   Anxiety 03/14/2017    1. Admit to Antenatal - hyperemesis protocol initiated  - NPO - advance diet as tolerated  - Maintain IV fluids and IV electrolyte replacement  -  Scheduled IV/PO antiemetics ordered with PRN IV/PO/PR antiemetics for breakthrough N/V  - Will start steroid taper if N/V refractory to antiemetics  - Daily weight  - Q4 hour intake/output   2. Routine antenatal care  - Activity as tolerated  - Bathroom privileges  - FHT q day   Gustavo LahAnna M Braiden Presutti, CNM  Certified Nurse Midwife [facility]

## 2022-05-10 DIAGNOSIS — O210 Mild hyperemesis gravidarum: Secondary | ICD-10-CM | POA: Diagnosis not present

## 2022-05-10 LAB — BASIC METABOLIC PANEL
Anion gap: 9 (ref 5–15)
BUN: 5 mg/dL — ABNORMAL LOW (ref 6–20)
CO2: 22 mmol/L (ref 22–32)
Calcium: 8.3 mg/dL — ABNORMAL LOW (ref 8.9–10.3)
Chloride: 100 mmol/L (ref 98–111)
Creatinine, Ser: 0.47 mg/dL (ref 0.44–1.00)
GFR, Estimated: >60 mL/min (ref >60)
Glucose, Bld: 78 mg/dL (ref 70–99)
Potassium: 3.8 mmol/L (ref 3.5–5.1)
Sodium: 131 mmol/L — ABNORMAL LOW (ref 135–145)

## 2022-05-10 LAB — CBC
HCT: 32.2 % — ABNORMAL LOW (ref 36.0–46.0)
Hemoglobin: 11.2 g/dL — ABNORMAL LOW (ref 12.0–15.0)
MCH: 31.7 pg (ref 26.0–34.0)
MCHC: 34.8 g/dL (ref 30.0–36.0)
MCV: 91.2 fL (ref 80.0–100.0)
Platelets: 305 10*3/uL (ref 150–400)
RBC: 3.53 MIL/uL — ABNORMAL LOW (ref 3.87–5.11)
RDW: 12.3 % (ref 11.5–15.5)
WBC: 7.9 10*3/uL (ref 4.0–10.5)
nRBC: 0 % (ref 0.0–0.2)

## 2022-05-10 MED ORDER — METOCLOPRAMIDE HCL 10 MG PO TABS: 10.0000 mg | ORAL_TABLET | Freq: Four times a day (QID) | ORAL | 7 refills | Status: DC

## 2022-05-10 MED ORDER — ONDANSETRON 4 MG PO TBDP: 4.0000 mg | ORAL_TABLET | Freq: Three times a day (TID) | ORAL | 0 refills | Status: AC | PRN

## 2022-05-10 MED ORDER — HYDROXYZINE HCL 50 MG PO TABS: 50.0000 mg | ORAL_TABLET | Freq: Four times a day (QID) | ORAL | 0 refills | Status: DC | PRN

## 2022-05-10 MED ORDER — FAMOTIDINE 20 MG PO TABS: 20.0000 mg | ORAL_TABLET | Freq: Two times a day (BID) | ORAL | 7 refills | Status: DC

## 2022-05-10 NOTE — Progress Notes (Signed)
Patient discharged, discharge instructions and follow up appointment given to patient.  All questions answered.  Patient preferred to walk versus wheelchair.

## 2022-05-10 NOTE — Discharge Summary (Signed)
Patient ID: Megan Kramer MRN: [id number] DOB/AGE: 1991/03/29 31 y.o.  Admit date: 05/09/2022 Discharge date: 05/10/2022  Admission Diagnoses: 31yo G3P2 presented for hyperemesis gravidarum   Discharge Diagnoses: Improved symptomology, able to tolerate regular bland diet  Factors complicating pregnancy: Chlamydia in pregnancy - will need TOC in 2 weeks  History of preeclampsia x 2  Hyperemesis Gravidarum  HSV-2 Previous c/section x 2  Prenatal Procedures: none  Consults: None  Significant Diagnostic Studies:  Results for orders placed or performed during the hospital encounter of 05/09/22 (from the past 168 hour(s))  CBC   Collection Time: 05/09/22 12:22 PM  Result Value Ref Range   WBC 9.2 4.0 - 10.5 K/uL   RBC 3.85 (L) 3.87 - 5.11 MIL/uL   Hemoglobin 12.0 12.0 - 15.0 g/dL   HCT 33.8 (L) 25.0 - 53.9 %   MCV 89.6 80.0 - 100.0 fL   MCH 31.2 26.0 - 34.0 pg   MCHC 34.8 30.0 - 36.0 g/dL   RDW 76.7 34.1 - 93.7 %   Platelets 364 150 - 400 K/uL   nRBC 0.0 0.0 - 0.2 %  Comprehensive metabolic panel   Collection Time: 05/09/22 12:22 PM  Result Value Ref Range   Sodium 134 (L) 135 - 145 mmol/L   Potassium 3.4 (L) 3.5 - 5.1 mmol/L   Chloride 102 98 - 111 mmol/L   CO2 24 22 - 32 mmol/L   Glucose, Bld 89 70 - 99 mg/dL   BUN 7 6 - 20 mg/dL   Creatinine, Ser 9.02 (L) 0.44 - 1.00 mg/dL   Calcium 8.5 (L) 8.9 - 10.3 mg/dL   Total Protein 6.6 6.5 - 8.1 g/dL   Albumin 3.4 (L) 3.5 - 5.0 g/dL   AST 19 15 - 41 U/L   ALT 17 0 - 44 U/L   Alkaline Phosphatase 57 38 - 126 U/L   Total Bilirubin 0.5 0.3 - 1.2 mg/dL   GFR, Estimated >40 >97 mL/min   Anion gap 8 5 - 15  Magnesium   Collection Time: 05/09/22 12:22 PM  Result Value Ref Range   Magnesium 1.8 1.7 - 2.4 mg/dL  Urinalysis, Routine w reflex microscopic Urine, Clean Catch   Collection Time: 05/09/22  5:26 PM  Result Value Ref Range   Color, Urine YELLOW (A) YELLOW   APPearance HAZY (A) CLEAR   Specific Gravity, Urine 1.012  1.005 - 1.030   pH 6.0 5.0 - 8.0   Glucose, UA NEGATIVE NEGATIVE mg/dL   Hgb urine dipstick NEGATIVE NEGATIVE   Bilirubin Urine NEGATIVE NEGATIVE   Ketones, ur 80 (A) NEGATIVE mg/dL   Protein, ur NEGATIVE NEGATIVE mg/dL   Nitrite NEGATIVE NEGATIVE   Leukocytes,Ua NEGATIVE NEGATIVE  Urine Drug Screen, Qualitative ([facility] only)   Collection Time: 05/09/22  5:26 PM  Result Value Ref Range   Tricyclic, Ur Screen NONE DETECTED NONE DETECTED   Amphetamines, Ur Screen NONE DETECTED NONE DETECTED   MDMA (Ecstasy)Ur Screen NONE DETECTED NONE DETECTED   Cocaine Metabolite,Ur [place] NONE DETECTED NONE DETECTED   Opiate, Ur Screen NONE DETECTED NONE DETECTED   Phencyclidine (PCP) Ur S NONE DETECTED NONE DETECTED   Cannabinoid 50 Ng, Ur [place] POSITIVE (A) NONE DETECTED   Barbiturates, Ur Screen NONE DETECTED NONE DETECTED   Benzodiazepine, Ur Scrn NONE DETECTED NONE DETECTED   Methadone Scn, Ur NONE DETECTED NONE DETECTED  CBC   Collection Time: 05/10/22  5:30 AM  Result Value Ref Range   WBC 7.9 4.0 - 10.5  K/uL   RBC 3.53 (L) 3.87 - 5.11 MIL/uL   Hemoglobin 11.2 (L) 12.0 - 15.0 g/dL   HCT 40.0 (L) 86.7 - 61.9 %   MCV 91.2 80.0 - 100.0 fL   MCH 31.7 26.0 - 34.0 pg   MCHC 34.8 30.0 - 36.0 g/dL   RDW 50.9 32.6 - 71.2 %   Platelets 305 150 - 400 K/uL   nRBC 0.0 0.0 - 0.2 %  Basic metabolic panel   Collection Time: 05/10/22  5:30 AM  Result Value Ref Range   Sodium 131 (L) 135 - 145 mmol/L   Potassium 3.8 3.5 - 5.1 mmol/L   Chloride 100 98 - 111 mmol/L   CO2 22 22 - 32 mmol/L   Glucose, Bld 78 70 - 99 mg/dL   BUN 5 (L) 6 - 20 mg/dL   Creatinine, Ser 4.58 0.44 - 1.00 mg/dL   Calcium 8.3 (L) 8.9 - 10.3 mg/dL   GFR, Estimated >09 >98 mL/min   Anion gap 9 5 - 15    Treatments: IV hydration and antiemetic  Hospital Course:  This is a 31 y.o. P3A2505 with IUP at [redacted]w[redacted]d admitted for HG.  IV hydration, antiemetics, and slowly advance diet.  She was observed, and she had no signs/symptoms of  progressing maternal-fetal concerns.  She was deemed stable for discharge to home with outpatient follow up.  Discharge Physical Exam:  BP 127/75 (BP Location: Right Arm)   Pulse 78   Temp 98.7 F (37.1 C) (Oral)   Resp 17   Ht 5' (1.524 m)   Wt 68.9 kg   SpO2 98%   BMI 29.69 kg/m   General: NAD CV: RRR Pulm: nl effort ABD: s/nd/nt, gravid DVT Evaluation: LE non-ttp, no evidence of DVT on exam.      Discharge Condition: Stable  Disposition: Discharge disposition: 01-Home or Self Care        Allergies as of 05/10/2022       Reactions   Codeine Nausea Only, Nausea And Vomiting   Shellfish Allergy Hives   hives   Latex Rash        Medication List     STOP taking these medications    Doxylamine-Pyridoxine 10-10 MG Tbec   valACYclovir 500 MG tablet Commonly known as: VALTREX       TAKE these medications    escitalopram 10 MG tablet Commonly known as: Lexapro Take 1 tablet (10 mg total) by mouth daily.   famotidine 20 MG tablet Commonly known as: PEPCID Take 1 tablet (20 mg total) by mouth every 12 (twelve) hours.   hydrOXYzine 50 MG tablet Commonly known as: ATARAX Take 1 tablet (50 mg total) by mouth every 6 (six) hours as needed for nausea or vomiting.   metoCLOPramide 10 MG tablet Commonly known as: REGLAN Take 1 tablet (10 mg total) by mouth every 6 (six) hours.   ondansetron 4 MG disintegrating tablet Commonly known as: ZOFRAN-ODT Take 1 tablet (4 mg total) by mouth every 8 (eight) hours as needed for nausea or vomiting.   promethazine 25 MG tablet Commonly known as: PHENERGAN Take 25 mg by mouth every 6 (six) hours as needed.   promethazine 25 MG suppository Commonly known as: PHENERGAN Place 1 suppository (25 mg total) rectally every 6 (six) hours as needed for nausea or vomiting.   pyridOXINE 25 MG tablet Commonly known as: VITAMIN B-6 Take 1 tablet by mouth 3 (three) times daily.        Follow-up  Information     Haroldine Lawsxley,  Brit Carbonell, CNM. Go on 05/16/2022.   Specialty: Certified Nurse Midwife Why: 2:45pm Contact information: [address] [place] [address] [phone]                 Signed:  Quillian QuinceJENNIFER Takoda Siedlecki, CNM 05/10/2022 4:05 PM

## 2022-06-14 DIAGNOSIS — O99320 Drug use complicating pregnancy, unspecified trimester: Secondary | ICD-10-CM | POA: Insufficient documentation

## 2022-09-01 DIAGNOSIS — O10919 Unspecified pre-existing hypertension complicating pregnancy, unspecified trimester: Secondary | ICD-10-CM | POA: Insufficient documentation

## 2022-10-09 ENCOUNTER — Encounter: Payer: Self-pay | Admitting: Obstetrics and Gynecology

## 2022-10-09 ENCOUNTER — Other Ambulatory Visit: Payer: Self-pay

## 2022-10-09 ENCOUNTER — Observation Stay
Admission: EM | Admit: 2022-10-09 | Discharge: 2022-10-09 | Disposition: A | Discharge status: Home / Self Care | Payer: 59 | Attending: Obstetrics and Gynecology | Admitting: Obstetrics and Gynecology

## 2022-10-09 DIAGNOSIS — O26893 Other specified pregnancy related conditions, third trimester: Secondary | ICD-10-CM | POA: Diagnosis not present

## 2022-10-09 DIAGNOSIS — Z3A33 33 weeks gestation of pregnancy: Secondary | ICD-10-CM | POA: Diagnosis not present

## 2022-10-09 DIAGNOSIS — R112 Nausea with vomiting, unspecified: Secondary | ICD-10-CM | POA: Diagnosis present

## 2022-10-09 DIAGNOSIS — O212 Late vomiting of pregnancy: Secondary | ICD-10-CM | POA: Diagnosis present

## 2022-10-09 DIAGNOSIS — Z1152 Encounter for screening for COVID-19: Secondary | ICD-10-CM | POA: Insufficient documentation

## 2022-10-09 DIAGNOSIS — R197 Diarrhea, unspecified: Secondary | ICD-10-CM | POA: Diagnosis not present

## 2022-10-09 LAB — CBC WITH DIFFERENTIAL/PLATELET
Abs Immature Granulocytes: 0.09 10*3/uL — ABNORMAL HIGH (ref 0.00–0.07)
Basophils Absolute: 0 10*3/uL (ref 0.0–0.1)
Basophils Relative: 0 %
Eosinophils Absolute: 0 10*3/uL (ref 0.0–0.5)
Eosinophils Relative: 0 %
HCT: 32.4 % — ABNORMAL LOW (ref 36.0–46.0)
Hemoglobin: 10.8 g/dL — ABNORMAL LOW (ref 12.0–15.0)
Immature Granulocytes: 1 %
Lymphocytes Relative: 10 %
Lymphs Abs: 0.8 10*3/uL (ref 0.7–4.0)
MCH: 29.3 pg (ref 26.0–34.0)
MCHC: 33.3 g/dL (ref 30.0–36.0)
MCV: 88 fL (ref 80.0–100.0)
Monocytes Absolute: 0.6 10*3/uL (ref 0.1–1.0)
Monocytes Relative: 6 %
Neutro Abs: 7.2 10*3/uL (ref 1.7–7.7)
Neutrophils Relative %: 83 %
Platelets: 291 10*3/uL (ref 150–400)
RBC: 3.68 MIL/uL — ABNORMAL LOW (ref 3.87–5.11)
RDW: 12.8 % (ref 11.5–15.5)
WBC: 8.7 10*3/uL (ref 4.0–10.5)
nRBC: 0 % (ref 0.0–0.2)

## 2022-10-09 LAB — RESP PANEL BY RT-PCR (FLU A&B, COVID) ARPGX2
Influenza A by PCR: NEGATIVE
Influenza B by PCR: NEGATIVE
SARS Coronavirus 2 by RT PCR: NEGATIVE

## 2022-10-09 LAB — LIPASE, BLOOD: Lipase: 32 U/L (ref 11–51)

## 2022-10-09 LAB — AMYLASE: Amylase: 74 U/L (ref 28–100)

## 2022-10-09 MED ORDER — ONDANSETRON HCL 4 MG/2ML IJ SOLN
4.0000 mg | Freq: Four times a day (QID) | INTRAMUSCULAR | Status: DC | PRN
  Administered 2022-10-09: 4 mg via INTRAVENOUS
  Filled 2022-10-09: qty 2

## 2022-10-09 MED ORDER — LOPERAMIDE HCL 2 MG PO CAPS: 2.0000 mg | ORAL_CAPSULE | ORAL | Status: DC | PRN

## 2022-10-09 MED ORDER — ONDANSETRON 4 MG PO TBDP: 4.0000 mg | ORAL_TABLET | Freq: Three times a day (TID) | ORAL | 0 refills | Status: DC | PRN

## 2022-10-09 MED ORDER — LACTATED RINGERS IV SOLN: INTRAVENOUS | Status: DC

## 2022-10-09 MED ORDER — LACTATED RINGERS IV BOLUS
500.0000 mL | Freq: Once | INTRAVENOUS | Status: AC
  Administered 2022-10-09: 500 mL via INTRAVENOUS

## 2022-10-09 NOTE — OB Triage Note (Signed)
Megan Kramer 31yo presents to Labor & Delivery triage via wheelchair steered by ED staff. She is a G3P2at 33w 0dGA. She denies signs and symptoms consistent with rupture of membranes or active vaginal bleeding. She denies contractions and states positive fetal movement. External FM and TOCO applied to non-tender abdomen. Initial FHR 134 Vital signs obtained and within normal limits. Patient oriented to care environment including call bell and bed control use. Lonie Peak, CNM notified of patient's arrival.

## 2022-10-09 NOTE — OB Triage Note (Signed)
Megan Kramer 31yo presents to Labor & Delivery triage via wheelchair steered by ED staff. She is a G3P2at 33w 0dGA. She presents with nausea, vomiting, and diarrhea since 2pm yesterday. She is unable to hold anything down and is now vomiting green bile as stated by the patient. She denies signs and symptoms consistent with rupture of membranes or active vaginal bleeding. She denies contractions and states positive fetal movement. External FM and TOCO applied to non-tender abdomen. Initial FHR 134 Vital signs obtained and within normal limits. Patient oriented to care environment including call bell and bed control use. Lonie Peak, CNM notified of patient's arrival.

## 2022-10-09 NOTE — Discharge Summary (Signed)
Patient ID: Megan Kramer MRN: [id number] DOB/AGE: 06/22/1991 31 y.o.  Admit date: 10/09/2022 Discharge date: 10/09/2022  Admission Diagnoses: 31yo G3P2 at [redacted]w[redacted]d presents with new onset of N/V/D since yesterday afternoon.  She report that she has been unable to keep anything down.  Discharge Diagnoses: Acute N/V likely food poisoning or viral  Factors complicating pregnancy: Chlamydia positive at NOB History of pre-eclampsia x2   Prenatal Procedures: NST  Consults: None  Significant Diagnostic Studies:  Results for orders placed or performed during the hospital encounter of 10/09/22 (from the past 168 hour(s))  Resp Panel by RT-PCR (Flu A&B, Covid) Anterior Nasal Swab   Collection Time: 10/09/22  1:02 PM   Specimen: Anterior Nasal Swab  Result Value Ref Range   SARS Coronavirus 2 by RT PCR NEGATIVE NEGATIVE   Influenza A by PCR NEGATIVE NEGATIVE   Influenza B by PCR NEGATIVE NEGATIVE  CBC with Differential/Platelet   Collection Time: 10/09/22  1:02 PM  Result Value Ref Range   WBC 8.7 4.0 - 10.5 K/uL   RBC 3.68 (L) 3.87 - 5.11 MIL/uL   Hemoglobin 10.8 (L) 12.0 - 15.0 g/dL   HCT 56.8 (L) 12.7 - 51.7 %   MCV 88.0 80.0 - 100.0 fL   MCH 29.3 26.0 - 34.0 pg   MCHC 33.3 30.0 - 36.0 g/dL   RDW 00.1 74.9 - 44.9 %   Platelets 291 150 - 400 K/uL   nRBC 0.0 0.0 - 0.2 %   Neutrophils Relative % 83 %   Neutro Abs 7.2 1.7 - 7.7 K/uL   Lymphocytes Relative 10 %   Lymphs Abs 0.8 0.7 - 4.0 K/uL   Monocytes Relative 6 %   Monocytes Absolute 0.6 0.1 - 1.0 K/uL   Eosinophils Relative 0 %   Eosinophils Absolute 0.0 0.0 - 0.5 K/uL   Basophils Relative 0 %   Basophils Absolute 0.0 0.0 - 0.1 K/uL   Immature Granulocytes 1 %   Abs Immature Granulocytes 0.09 (H) 0.00 - 0.07 K/uL  Lipase, blood   Collection Time: 10/09/22  1:02 PM  Result Value Ref Range   Lipase 32 11 - 51 U/L  Amylase   Collection Time: 10/09/22  1:02 PM  Result Value Ref Range   Amylase 74 28 - 100 U/L     Treatments: IV hydration and antiemetic  Hospital Course:  This is a 31 y.o. G3P2002 with IUP at [redacted]w[redacted]d seen for new onset n/v/d.  No vomiting or diarrhea since arrival.  Labs normal. She was observed, fetal heart rate monitoring remained reassuring, and she had no signs/symptoms of other maternal-fetal concerns.  She was deemed stable for discharge to home with outpatient follow up.  Discharge Physical Exam:  BP 123/77 (BP Location: Right Arm)   Pulse (!) 113   Temp 98.1 F (36.7 C) (Oral)   Resp 19   Ht 5' (1.524 m)   Wt 72.6 kg   BMI 31.25 kg/m   General: NAD CV: RRR Pulm: nl effort ABD: s/nd/nt, gravid DVT Evaluation: LE non-ttp, no evidence of DVT on exam.  NST: FHR baseline: 140 bpm Variability: moderate Accelerations: yes Decelerations: none Category/reactivity: reactive   TOCO: quiet SVE: deferred      Discharge Condition: Stable  Disposition: Discharge disposition: 01-Home or Self Care        Allergies as of 10/09/2022       Reactions   Codeine Nausea Only, Nausea And Vomiting   Shellfish Allergy Hives   hives  Latex Rash        Medication List     STOP taking these medications    escitalopram 10 MG tablet Commonly known as: Lexapro   famotidine 20 MG tablet Commonly known as: PEPCID   hydrOXYzine 50 MG tablet Commonly known as: ATARAX   metoCLOPramide 10 MG tablet Commonly known as: REGLAN   promethazine 25 MG suppository Commonly known as: PHENERGAN   promethazine 25 MG tablet Commonly known as: PHENERGAN   pyridOXINE 25 MG tablet Commonly known as: VITAMIN B6       TAKE these medications    multivitamin-prenatal 27-0.8 MG Tabs tablet Take 1 tablet by mouth daily at 12 noon.   ondansetron 4 MG disintegrating tablet Commonly known as: ZOFRAN-ODT Take 1 tablet (4 mg total) by mouth every 8 (eight) hours as needed for nausea or vomiting.        Follow-up [facility] Follow up.    Why: Keep all scheduled appointments Contact information: [place] [phone]                Signed:  Regina Eck 10/09/2022 2:22 PM

## 2022-10-29 ENCOUNTER — Encounter: Payer: Self-pay | Admitting: Obstetrics and Gynecology

## 2022-10-29 ENCOUNTER — Observation Stay
Admission: EM | Admit: 2022-10-29 | Discharge: 2022-10-29 | Disposition: A | Discharge status: Home / Self Care | Payer: 59 | Attending: Obstetrics and Gynecology | Admitting: Obstetrics and Gynecology

## 2022-10-29 ENCOUNTER — Other Ambulatory Visit: Payer: Self-pay

## 2022-10-29 DIAGNOSIS — Z79899 Other long term (current) drug therapy: Secondary | ICD-10-CM | POA: Diagnosis not present

## 2022-10-29 DIAGNOSIS — J45909 Unspecified asthma, uncomplicated: Secondary | ICD-10-CM | POA: Diagnosis not present

## 2022-10-29 DIAGNOSIS — O113 Pre-existing hypertension with pre-eclampsia, third trimester: Secondary | ICD-10-CM | POA: Insufficient documentation

## 2022-10-29 DIAGNOSIS — Z7982 Long term (current) use of aspirin: Secondary | ICD-10-CM | POA: Diagnosis not present

## 2022-10-29 DIAGNOSIS — O26893 Other specified pregnancy related conditions, third trimester: Secondary | ICD-10-CM | POA: Diagnosis not present

## 2022-10-29 DIAGNOSIS — O99513 Diseases of the respiratory system complicating pregnancy, third trimester: Secondary | ICD-10-CM | POA: Insufficient documentation

## 2022-10-29 DIAGNOSIS — O163 Unspecified maternal hypertension, third trimester: Secondary | ICD-10-CM | POA: Diagnosis present

## 2022-10-29 DIAGNOSIS — R519 Headache, unspecified: Secondary | ICD-10-CM | POA: Insufficient documentation

## 2022-10-29 DIAGNOSIS — Z91040 Latex allergy status: Secondary | ICD-10-CM | POA: Insufficient documentation

## 2022-10-29 DIAGNOSIS — O10913 Unspecified pre-existing hypertension complicating pregnancy, third trimester: Secondary | ICD-10-CM | POA: Diagnosis present

## 2022-10-29 LAB — COMPREHENSIVE METABOLIC PANEL
ALT: 9 U/L (ref 0–44)
AST: 20 U/L (ref 15–41)
Albumin: 2.9 g/dL — ABNORMAL LOW (ref 3.5–5.0)
Alkaline Phosphatase: 147 U/L — ABNORMAL HIGH (ref 38–126)
Anion gap: 6 (ref 5–15)
BUN: 6 mg/dL (ref 6–20)
CO2: 22 mmol/L (ref 22–32)
Calcium: 8.6 mg/dL — ABNORMAL LOW (ref 8.9–10.3)
Chloride: 106 mmol/L (ref 98–111)
Creatinine, Ser: 0.46 mg/dL (ref 0.44–1.00)
GFR, Estimated: >60 mL/min (ref >60)
Glucose, Bld: 97 mg/dL (ref 70–99)
Potassium: 3.7 mmol/L (ref 3.5–5.1)
Sodium: 134 mmol/L — ABNORMAL LOW (ref 135–145)
Total Bilirubin: 0.6 mg/dL (ref 0.3–1.2)
Total Protein: 6.6 g/dL (ref 6.5–8.1)

## 2022-10-29 LAB — CBC
HCT: 31.8 % — ABNORMAL LOW (ref 36.0–46.0)
Hemoglobin: 10.6 g/dL — ABNORMAL LOW (ref 12.0–15.0)
MCH: 28.4 pg (ref 26.0–34.0)
MCHC: 33.3 g/dL (ref 30.0–36.0)
MCV: 85.3 fL (ref 80.0–100.0)
Platelets: 331 10*3/uL (ref 150–400)
RBC: 3.73 MIL/uL — ABNORMAL LOW (ref 3.87–5.11)
RDW: 12.9 % (ref 11.5–15.5)
WBC: 8.3 10*3/uL (ref 4.0–10.5)
nRBC: 0 % (ref 0.0–0.2)

## 2022-10-29 LAB — PROTEIN / CREATININE RATIO, URINE
Creatinine, Urine: 86 mg/dL
Protein Creatinine Ratio: 0.1 mg/mg{Cre} (ref 0.00–0.15)
Total Protein, Urine: 9 mg/dL

## 2022-10-29 MED ORDER — ACETAMINOPHEN 500 MG PO TABS
1000.0000 mg | ORAL_TABLET | Freq: Four times a day (QID) | ORAL | Status: DC | PRN
  Administered 2022-10-29: 1000 mg via ORAL
  Filled 2022-10-29: qty 2

## 2022-10-29 MED ORDER — MAGNESIUM OXIDE -MG SUPPLEMENT 400 (240 MG) MG PO TABS
800.0000 mg | ORAL_TABLET | Freq: Once | ORAL | Status: AC
  Administered 2022-10-29: 800 mg via ORAL
  Filled 2022-10-29: qty 2

## 2022-10-29 NOTE — OB Triage Note (Signed)
Pt presents from work c/o headache and elevated BP. Pt states she checked BP at work twice and got 139/90, and 146/96. Pt called the office and was advised to come to the hospital for further evaluation. Pt rates HA 3/10 on pain scale and describes it just as a dull headache. Pt denies blurry vision or epigastric pain. Pt has +2 reflexes and no clonus noted. Bp denies bleeding or LOF. Pt reports positive fetal movement. Initial BP was 147/77. Bps cycling q 15 mins. All other VSS. Will continue to monitor.

## 2022-10-29 NOTE — Discharge Instructions (Addendum)
Go home and do absolutely nothing but rest! Take warm baths. Strict orders from the midwife for significant other to cook dinner and give you a foot massage before bedtime. Take tylenol and magnesium for headache, but stop if it doesn't help. Take mucinex for congestion but avoid anything with phenylephrine and sudafed in it, as this could increase your blood pressure even more.

## 2022-10-29 NOTE — Discharge Summary (Signed)
RN reviewed discharge instructions with patient. Gave patient opportunity for questions. All questions answered at this time. Pt verbalized understanding. Pt discharged home. 

## 2022-10-29 NOTE — Discharge Summary (Signed)
Megan Kramer is a 31 y.o. female. She is at [redacted]w[redacted]d gestation. No LMP recorded. Patient is pregnant. 11/27/2022, by Ultrasound   Prenatal care site: [facility]  Chief complaint: elevated blood pressure at home   HPI: Megan Kramer presents to L&D with complaints of elevated blood pressure of 131/90 and 140/90 at home.  States she's had a mild headache all day and has not taken Tylenol yet.  Reports she does not feel well and feels fatigued.  Her symptoms have improved with resting and hydrating in OB triage.  Denies changes in vision or RUQ pain.  Denies contractions, cramping, LOF, or vaginal bleeding.  Endorses good fetal movement.  Denies fever, cough, sore throat.  Reports general fatigue and sinus congestion that started yesterday.    Factors complicating pregnancy: Chronic HTN in pregnancy  Marijuana use in pregnancy - stopped using in 1st trimester  Chlamydia infection - TOC negative  History of preeclampsia x 2  Hyperemesis Gravidarum  HSV-2 Previous c/section x 2  S: Resting comfortably. no CTX, no VB.no LOF,  Active fetal movement.   Maternal Medical History:  Past Medical Hx:  has a past medical history of Anxiety, Asthma, Chronic cholecystitis without calculus, Depression, Dysrhythmia, Family history of adverse reaction to anesthesia, Genital herpes, GERD (gastroesophageal reflux disease), Heart murmur, History of kidney stones, Hyperlipidemia, Hypertension, IBS (irritable bowel syndrome), Obesity (BMI 30-39.9), and Postpartum depression associated with first pregnancy.    Past Surgical Hx:  has a past surgical history that includes Cesarean section (12/21/2014); Cesarean section (N/A, 05/27/2017); OTHER SURGICAL HISTORY; Wisdom tooth extraction; Cystoscopy/ureteroscopy/holmium laser/stent placement (Right, 02/27/2018); and Cholecystectomy (N/A, 05/15/2018).   Allergies  Allergen Reactions   Codeine Nausea Only and Nausea And Vomiting   Shellfish Allergy Hives    hives    Latex Rash     Prior to Admission medications   Medication Sig Start Date End Date Taking? Authorizing Provider  aspirin EC 81 MG tablet Take 81 mg by mouth daily. Swallow whole.   Yes [provider]  Prenatal Vit-Fe Fumarate-FA (MULTIVITAMIN-PRENATAL) 27-0.8 MG TABS tablet Take 1 tablet by mouth daily at 12 noon.   Yes [provider]  ondansetron (ZOFRAN-ODT) 4 MG disintegrating tablet Take 1 tablet (4 mg total) by mouth every 8 (eight) hours as needed for nausea or vomiting. Patient not taking: Reported on 10/29/2022 10/09/22   Linda Hedges, CNM    Social History: She  reports that she has never smoked. She has never used smokeless tobacco. She reports that she does not drink alcohol and does not use drugs.  Family History: family history includes Arthritis in her paternal grandfather; Breast cancer in her maternal grandmother; Crohn's disease in her paternal aunt; Depression in her mother; Diabetes in her maternal grandfather and another family member; Diverticulosis in her mother; GER disease in her paternal aunt; Hearing loss in her father; Heart disease in her maternal grandfather; Hyperlipidemia in her father and mother; Hypertension in her father and mother; Irritable bowel syndrome in her father and paternal aunt; Miscarriages / Stillbirths in her maternal grandmother.   Review of Systems: A full review of systems was performed and negative except as noted in the HPI.     Pertinent Results:  Prenatal Labs: Blood type/Rh A pos  Antibody screen neg  Rubella Immune  Varicella Immune  RPR NR  HBsAg Neg  Hep C  NR  HIV NR  GC neg  Chlamydia neg  1 hour GTT 94  3 hour GTT N/A  GBS N/A    O:  BP 128/77   Pulse 86   Temp 98.8 F (37.1 C) (Oral)   Resp 20   Ht 5' (1.524 m)   Wt 72.6 kg   SpO2 98%   BMI 31.25 kg/m  Results for orders placed or performed during the hospital encounter of 10/29/22 (from the past 48 hour(s))  Protein / creatinine ratio,  urine   Collection Time: 10/29/22 12:14 PM  Result Value Ref Range   Creatinine, Urine 86 mg/dL   Total Protein, Urine 9 mg/dL   Protein Creatinine Ratio 0.10 0.00 - 0.15 mg/mg[Cre]  CBC   Collection Time: 10/29/22 12:19 PM  Result Value Ref Range   WBC 8.3 4.0 - 10.5 K/uL   RBC 3.73 (L) 3.87 - 5.11 MIL/uL   Hemoglobin 10.6 (L) 12.0 - 15.0 g/dL   HCT 61.4 (L) 43.1 - 54.0 %   MCV 85.3 80.0 - 100.0 fL   MCH 28.4 26.0 - 34.0 pg   MCHC 33.3 30.0 - 36.0 g/dL   RDW 08.6 76.1 - 95.0 %   Platelets 331 150 - 400 K/uL   nRBC 0.0 0.0 - 0.2 %  Comprehensive metabolic panel   Collection Time: 10/29/22 12:19 PM  Result Value Ref Range   Sodium 134 (L) 135 - 145 mmol/L   Potassium 3.7 3.5 - 5.1 mmol/L   Chloride 106 98 - 111 mmol/L   CO2 22 22 - 32 mmol/L   Glucose, Bld 97 70 - 99 mg/dL   BUN 6 6 - 20 mg/dL   Creatinine, Ser 9.32 0.44 - 1.00 mg/dL   Calcium 8.6 (L) 8.9 - 10.3 mg/dL   Total Protein 6.6 6.5 - 8.1 g/dL   Albumin 2.9 (L) 3.5 - 5.0 g/dL   AST 20 15 - 41 U/L   ALT 9 0 - 44 U/L   Alkaline Phosphatase 147 (H) 38 - 126 U/L   Total Bilirubin 0.6 0.3 - 1.2 mg/dL   GFR, Estimated >67 >12 mL/min   Anion gap 6 5 - 15     Constitutional: NAD, AAOx3  HE/ENT: extraocular movements grossly intact, moist mucous membranes CV: RRR PULM: nl respiratory effort Abd: gravid, non-tender, non-distended, soft  Ext: Non-tender, Nonedmeatous Psych: mood appropriate, speech normal Pelvic : deferred   NST: Baseline FHR: 135 beats/min Variability: minimal  Accelerations: present Decelerations: absent Tocometry: Occasional mild contractions   Interpretation: Category I INDICATIONS: chronic hypertension RESULTS:  A NST procedure was performed with FHR monitoring and a normal baseline established, appropriate time of 20-40 minutes of evaluation, and accels >2 seen w 15x15 characteristics.  Results show a REACTIVE NST.   Assessment: 30 y.o. [id number] [redacted]w[redacted]d 11/27/2022, by Ultrasound    Principle diagnosis: Elevated blood pressure affecting pregnancy in third trimester, antepartum [O16.3]   Plan: 1) Reactive NST  -Category 1 tracing  -Reassuring fetal status   2) Elevated blood pressure  -Initial BP mild range and subsequent normal BP's -She has a known history of chronic HTN that has been well controlled in pregnancy with no medications  -Symptoms improved with rest and hydration  -Discussed that she may have the start of a viral illness and reviewed comfort measures -Discussed warning signs to return to L&D triage with   -Work note provided for today and tomorrow  -Instructed to avoid products that contain phenylephrine and ephedrine d/t effect on blood pressure   3) Disposition: discharge home stable -Precautions reviewed  -Follow up as scheduled tomorrow for NST and  prenatal visit    ----- Margaretmary Eddy, CNM Certified Nurse Midwife [facility]

## 2022-10-31 NOTE — H&P (Signed)
OB History & Physical   History of Present Illness:  Chief Complaint: Preoperative visit for cesarean delivery and bilateral tubal ligation  HPI:  Megan Kramer is a 31 y.o. G72P2002 female at [redacted]w[redacted]d dated by a 5 week ultrasound.  Her pregnancy has been complicated by history of c-section x 2, HSV, history of preeclampsia x 2, chronic hypertension .    She denies contractions.   She denies leakage of fluid.   She denies vaginal bleeding.   She reports fetal movement.    Maternal Medical History:   Past Medical History:  Diagnosis Date   Anxiety    Asthma    EXERCISE INDUCED-NO INHALER NOW   Chronic cholecystitis without calculus    Depression    Dysrhythmia    TACHYCARDIA WITH 1ST PREGNANCY-ECHO DONE AND WAS WNL-ASYMPTOMATIC NOW   Family history of adverse reaction to anesthesia    MOM- N/V   Genital herpes    GERD (gastroesophageal reflux disease)    H/O WITH PREGNANCY   Heart murmur    AS A CHILD-ASYMPTOMATIC   History of kidney stones    Hyperlipidemia    Hypertension    pregnancy induced   IBS (irritable bowel syndrome)    Obesity (BMI 30-39.9)    Postpartum depression associated with first pregnancy     Past Surgical History:  Procedure Laterality Date   CESAREAN SECTION  12/21/2014   Teresa Coom[place], [place]   CESAREAN SECTION N/A 05/27/2017   Procedure: CESAREAN SECTION;  Surgeon: Nadara Mustard, MD;  Location: [facility] ORS;  Service: Obstetrics;  Laterality: N/A;   CHOLECYSTECTOMY N/A 05/15/2018   Procedure: LAPAROSCOPIC CHOLECYSTECTOMY WITH INTRAOPERATIVE CHOLANGIOGRAM;  Surgeon: Earline Mayotte, MD;  Location: [facility] ORS;  Service: General;  Laterality: N/A;   CYSTOSCOPY/URETEROSCOPY/HOLMIUM LASER/STENT PLACEMENT Right 02/27/2018   Procedure: CYSTOSCOPY/URETEROSCOPY/HOLMIUM LASER/STENT PLACEMENT;  Surgeon: Riki Altes, MD;  Location: [facility] ORS;  Service: Urology;  Laterality: Right;   OTHER SURGICAL HISTORY     mole bx    WISDOM TOOTH EXTRACTION      Allergies   Allergen Reactions   Codeine Nausea Only and Nausea And Vomiting   Shellfish Allergy Hives    hives   Latex Rash    Prior to Admission medications   Medication Sig Start Date End Date Taking? Authorizing Provider  aspirin EC 81 MG tablet Take 81 mg by mouth daily. Swallow whole.    [provider]  Prenatal Vit-Fe Fumarate-FA (MULTIVITAMIN-PRENATAL) 27-0.8 MG TABS tablet Take 1 tablet by mouth daily at 12 noon.    [provider]    OB History  Gravida Para Term Preterm AB Living  3 2 2     2   SAB IAB Ectopic Multiple Live Births          2    # Outcome Date GA Lbr Len/2nd Weight Sex Delivery Anes PTL Lv  3 Current           2 Term 05/27/17 [redacted]w[redacted]d  3462 g M CS-LTranv  N LIV     Complications: Preeclampsia, third trimester  1 Term 12/21/14 [redacted]w[redacted]d  3572 g F CS-LTranv  N LIV     Complications: Herpes genitalia, Severe preeclampsia    Obstetric Comments  Preeclampsia with G1 and 2  1st Menstrual Cycle:  8   1st Pregnancy:  24    Prenatal care site: [facility]  Social History: She  reports that she has never smoked. She has never used smokeless tobacco. She reports  that she does not drink alcohol and does not use drugs.  Family History: family history includes Arthritis in her paternal grandfather; Breast cancer in her maternal grandmother; Crohn's disease in her paternal aunt; Depression in her mother; Diabetes in her maternal grandfather and another family member; Diverticulosis in her mother; GER disease in her paternal aunt; Hearing loss in her father; Heart disease in her maternal grandfather; Hyperlipidemia in her father and mother; Hypertension in her father and mother; Irritable bowel syndrome in her father and paternal aunt; Miscarriages / Stillbirths in her maternal grandmother.     Review of Systems  Constitutional: Negative.   HENT: Negative.    Eyes: Negative.   Respiratory: Negative.    Cardiovascular: Negative.   Gastrointestinal:  Negative.   Genitourinary: Negative.   Musculoskeletal: Negative.   Skin: Negative.   Neurological: Negative.   Psychiatric/Behavioral: Negative.       Physical Exam:  BP 128/87   Pulse 97   Ht 152.4 cm (5')   Wt 74.8 kg (165 lb)   LMP  (LMP Unknown)   BMI 32.22 kg/m   Pregravid weight 64.4 kg (142 lb) Total Weight Gain 10.4 kg (23 lb) Physical Exam Constitutional:      General: She is not in acute distress.    Appearance: Normal appearance. She is well-developed.  Genitourinary:     Vulva, bladder and urethral meatus normal.     Right Labia: No rash, tenderness, lesions, skin changes or Bartholin's cyst.    Left Labia: No tenderness, lesions, skin changes, Bartholin's cyst or rash.    No inguinal adenopathy present in the right or left side.    Pelvic Tanner Score: 5/5.     Right Adnexa: not tender, not full and no mass present.    Left Adnexa: not tender, not full and no mass present.    No cervical motion tenderness, friability, lesion or polyp.     Uterus is not enlarged, fixed or tender.     Uterus is anteverted.     No urethral tenderness or mass present.     Pelvic exam was performed with patient in the lithotomy position.  HENT:     Head: Normocephalic and atraumatic.  Eyes:     General: No scleral icterus.    Conjunctiva/sclera: Conjunctivae normal.  Cardiovascular:     Rate and Rhythm: Normal rate and regular rhythm.     Heart sounds: No murmur heard.    No friction rub. No gallop.  Pulmonary:     Effort: Pulmonary effort is normal. No respiratory distress.     Breath sounds: Normal breath sounds. No wheezing or rales.  Abdominal:     General: Bowel sounds are normal. There is no distension.     Palpations: Abdomen is soft. There is mass (gravid, NT).     Tenderness: There is no abdominal tenderness. There is no guarding or rebound.     Hernia: There is no hernia in the left inguinal area or right inguinal area.  Musculoskeletal:        General: Normal  range of motion.     Cervical back: Normal range of motion and neck supple.  Lymphadenopathy:     Lower Body: No right inguinal adenopathy. No left inguinal adenopathy.  Neurological:     General: No focal deficit present.     Mental Status: She is alert and oriented to person, place, and time.     Cranial Nerves: No cranial nerve deficit.  Skin:  General: Skin is warm and dry.     Findings: No erythema.  Psychiatric:        Mood and Affect: Mood normal.        Behavior: Behavior normal.        Judgment: Judgment normal.     Chronic hypertension affecting pregnancy 09/01/2022 by Rubye Oaks, MD No     Marijuana use during pregnancy 06/14/2022 by Kathaleen Grinder No    Overview Signed 06/14/2022 10:14 AM by Kathaleen Grinder      Stopped - nausea has gotten better         Hyperemesis complicating pregnancy, antepartum 05/24/2022 by Kathaleen Grinder No    Supervision of high risk pregnancy in third trimester 04/22/2022 by Effie Shy, CMA No    Overview Addendum 10/29/2022  1:48 PM by Launa Flight, CMA      31 y.o. [id number] at  No LMP recorded (lmp unknown). Patient is pregnant. in consistent with  ultrasound 07/04/22@[redacted]w[redacted]d . Estimated Date of Delivery:11/28/22 Sex of baby and name:  " "        Baby boy                 Partner:  Caleb   Factors complicating this pregnancy  History of pre-eclampsia x2 Baseline labs: CMP: Platelets: P/C ratio:61 81mg  aspirin 12-36wks (had stopped taking it since thought she only had to take it if she had pre-eclampsia, restarted on 8/11) Desires BTL Medicaid consent signed 09/13/22 - DW 2.History of Anxiety Was on Paroxetine low-dose prior to the pregnancy which worked well for her. Stopped in pregnancy. Wants to restart Paroxetine postpartum, not planning on breastfeeding. Hyperemesis Gravidarum  Has tried Zofran, phenergan, and Reglan without relief Only med that helps is Dramamine Been to ER several times for IV  hydration, 1 ante admission Positive for THC on UDS - decreasing and stopping use  Improved when she stopped marijuana  4. HSV-2 Start prophylaxis @ 36wks   5. Marijuana use in pregnancy Regular use prior to pregnancy  Stopped use as of June 2023 UDS screen on admission to L&D    6. Previous C/S x2 C/s done for: pre-eclampsia and repeat Request for records sent: (in Care Everywhere, at [facility]) Delivery preference: repeat c/s with Dr. [facility] Jean Rosenthal would love to assist) - for now 11/30 with SDJ     7.   Chronic hypertension: elevated blood pressure early in pregnancy.  Again at 24 and 27 week.  If no other complicating factors, deliver [redacted]w[redacted]d-[redacted]w[redacted]d.  Monthly growth [redacted]w[redacted]d Weekly NST starting at 32wks    Screening results and needs: NOB:  Medicaid Questionnaire:  []  [facility] Program Depression Score:10 MBT:A pos.   Ab screen:  neg  HIV:  neg          RPR:  NR Hep B:neg  Hep C: NR Pap: 02/23 NILM          G/C:Neg/Positive  Rubella: Immune    TSH:1.407      HgA1C:5.1 Aneuploidy:  First trimester:  MaternitT21:  denied  Second trimester (AFP/tetra): denied 28 weeks:  Review Medicaid Questionnaire: []  ACHD Program Depression Score:4 Blood consent:signed 09/13/22 Hgb: 11.4  Platelets:343    Glucola: 94   Rhogam: n/a 36 weeks:  GBS:   G/C:   Hgb:  Platelets:    HIV:     RPR:    Last ZSW:FUXNAT:  07/04/22: NORMAL ANATOMY SEEN Single, viable IUP, S=19.1wks FHT=152bpm Cervical length=3.97cm Placenta=anterior Position=breech 09/13/22: Trv  head  to rt, Fhr= 140  bpm, Ant  plac, Afi=  18.18 cm=   70 %, Efw= 1384   g =    56 % 10/16/22: EFW= 5lb4oz(2378g)= 66% FHR=129bpm AFI= 13.31cm @ 44% Placenta=anterior Position=vertex, spine rt Immunization:   Flu in season- declined 10/22/22  Tdap at 27-36 weeks -given 09/13/22 bl Covid-19 -  RSV at 32-36 weeks -  Contraception Plan: BTL - consent signed 09/13/22 Feeding Plan:  Labor Plans: repeat c-section with Dr. Jean Rosenthal      Lab Results   Component Value Date   SARSCOV2NAA NEGATIVE 10/09/2022  ]  Assessment:  MAIRIM BADE is a 31 y.o. G55P2002 female at [redacted]w[redacted]d with history of c-section, bilateral tubal ligation.   Plan:  Admit to Labor & Delivery  CBC, T&S, NPO, IVF GBS unknown.   Admit for surgery, as above.   Thomasene Mohair, MD 10/31/2022 5:50 PM

## 2022-11-12 ENCOUNTER — Encounter
Admission: RE | Admit: 2022-11-12 | Discharge: 2022-11-12 | Disposition: A | Discharge status: Home / Self Care | Payer: 59 | Source: Ambulatory Visit | Attending: Obstetrics and Gynecology | Admitting: Obstetrics and Gynecology

## 2022-11-12 NOTE — Pre-Procedure Instructions (Signed)
During preop phone call, this patient stated that she had blurred vision and headache for last few days, advised that she needs to call her OB-Gyn. Had history of Pre-eclampsia last two pregnancies.

## 2022-11-12 NOTE — Patient Instructions (Addendum)
Your procedure is scheduled on: Thursday 11/21/22   Arrival Time: Please call Labor and Delivery the day before your scheduled C-Section to find out your arrival time. [phone].  Arrival: If your arrival time is prior to 6:00 am, please enter through the Emergency Room Entrance and you will be directed to Labor and Delivery. If your arrival time is 6:00 am or later, please enter the [facility] and follow the greeter's instructions.  REMEMBER: Instructions that are not followed completely may result in serious medical risk, up to and including death; or upon the discretion of your surgeon and anesthesiologist your surgery may need to be rescheduled.  Do not eat food after midnight the night before surgery.  No gum chewing, lozengers or hard candies.  You may however, drink CLEAR liquids up to 2 hours before you are scheduled to arrive for your surgery. Do not drink anything within 2 hours of your scheduled arrival time.  Clear liquids include: - water  - apple juice without pulp - gatorade (not RED colors) - black coffee or tea (Do NOT add milk or creamers to the coffee or tea) Do NOT drink anything that is not on this list.  TAKE THESE MEDICATIONS THE MORNING OF SURGERY WITH A SIP OF WATER:   valACYclovir (VALTREX) 500 MG tablet    Follow recommendations from Cardiologist, Pulmonologist or PCP regarding stopping Aspirin. One week prior to surgery: Stop Anti-inflammatories (NSAIDS) such as Advil, Aleve, Ibuprofen, Motrin, Naproxen, Naprosyn and Aspirin based products such as Excedrin, Goodys Powder, BC Powder. Stop ANY OVER THE COUNTER supplements until after surgery. You may however, continue to take Tylenol if needed for pain up until the day of surgery.  No Alcohol for 24 hours before or after surgery.  No Smoking including e-cigarettes for 24 hours prior to surgery.  No chewable tobacco products for at least 6 hours prior to surgery.  No nicotine patches on the day of  surgery.  Do not use any "recreational" drugs for at least a week prior to your surgery.  Please be advised that the combination of cocaine and anesthesia may have negative outcomes, up to and including death. If you test positive for cocaine, your surgery will be cancelled.  On the morning of surgery brush your teeth with toothpaste and water, you may rinse your mouth with mouthwash if you wish. Do not swallow any toothpaste or mouthwash.  Use CHG soap as directed on instruction sheet.  Do not wear jewelry, make-up, hairpins, clips or nail polish.  Do not wear lotions, powders, or perfumes.   Do not shave body from the neck down 48 hours prior to surgery just in case you cut yourself which could leave a site for infection.  Also, freshly shaved skin may become irritated if using the CHG soap.  Contact lenses, hearing aids and dentures may not be worn into surgery.  Do not bring valuables to the hospital. [facility] is not responsible for any missing/lost belongings or valuables.   Bring your C-PAP to the hospital with you in case you may have to spend the night.   Notify your doctor if there is any change in your medical condition (cold, fever, infection).  Wear comfortable clothing (specific to your surgery type) to the hospital.  After surgery, you can help prevent lung complications by doing breathing exercises.  Take deep breaths and cough every 1-2 hours. Your doctor may order a device called an Incentive Spirometer to help you take deep breaths. When coughing  or sneezing, hold a pillow firmly against your incision with both hands. This is called "splinting." Doing this helps protect your incision. It also decreases belly discomfort.  Please call the Pre-admissions Testing Dept. at [phone] if you have any questions about these instructions.  Surgery Visitation Policy:  Support Person  The designated support person is not considered a visitor.  Any inpatient  support person will be given a band identifying them as the patient's chosen support person and may stay with the patient around the clock.  Visitor Passes   All visitors, including children, need an identification sticker when visiting. These stickers must be worn where they can be seen.   Labor & Delivery  Laboring women of any age may have one designated support person and one other visitor aged 65 and older at a time, and a doula registered with [facility] for the labor and delivery phase of their stay. (Doulas not registered with [facility] are counted as visitors.) The visitor may switch with other visitors. Visitation is allowed 24 hours per day.  No children under the age of 19. The designated support person or a visitor may stay overnight in the room.  Mother Baby Unit, OB Specialty and Gynecological Care  A designated support person and three other visitors of any age may visit. The three visitors may switch out. The designated support person or a visitor aged 69 or older may stay overnight in the room. During the postpartum period (up to 6 weeks), if the mother is the patient, she can have her newborn stay with her if there is another support person present who can be responsible for the baby.   MASKING: Due to an increase in RSV rates and hospitalizations, starting Wednesday, Nov. 15, in patient care areas in which we serve newborns, infants and children, masks will be required for teammates and visitors.  Children ages 37 and under may not visit. This policy affects the following departments only:  [facility] Postpartum area Mother Baby Unit Newborn nursery/Special care nursery  Other areas: Masks continue to be strongly recommended for patient-facing teammates, visitors and patients in all other areas. Visitation is not restricted outside of the units listed above.     Preparing for Surgery with CHLORHEXIDINE GLUCONATE (CHG)  Soap  Chlorhexidine Gluconate (CHG) Soap  o An antiseptic cleaner that kills germs and bonds with the skin to continue killing germs even after washing  o Used for showering the night before surgery and morning of surgery  Before surgery, you can play an important role by reducing the number of germs on your skin.  CHG (Chlorhexidine gluconate) soap is an antiseptic cleanser which kills germs and bonds with the skin to continue killing germs even after washing.  Please do not use if you have an allergy to CHG or antibacterial soaps. If your skin becomes reddened/irritated stop using the CHG.  1. Shower the NIGHT BEFORE SURGERY and the MORNING OF SURGERY with CHG soap.  2. If you choose to wash your hair, wash your hair first as usual with your normal shampoo.  3. After shampooing, rinse your hair and body thoroughly to remove the shampoo.  4. Use CHG as you would any other liquid soap. You can apply CHG directly to the skin and wash gently with a scrungie or a clean washcloth.  5. Apply the CHG soap to your body only from the neck down. Do not use on open wounds or open sores. Avoid  contact with your eyes, ears, mouth, and genitals (private parts). Wash face and genitals (private parts) with your normal soap.  6. Wash thoroughly, paying special attention to the area where your surgery will be performed.  7. Thoroughly rinse your body with warm water.  8. Do not shower/wash with your normal soap after using and rinsing off the CHG soap.  9. Pat yourself dry with a clean towel.  10. Wear clean pajamas to bed the night before surgery.  12. Place clean sheets on your bed the night of your first shower and do not sleep with pets.  13. Shower again with the CHG soap on the day of surgery prior to arriving at the hospital.  14. Do not apply any deodorants/lotions/powders.  15. Please wear clean clothes to the hospital.

## 2022-11-13 ENCOUNTER — Other Ambulatory Visit: Payer: 59

## 2022-11-13 ENCOUNTER — Other Ambulatory Visit: Payer: Self-pay

## 2022-11-13 ENCOUNTER — Encounter: Payer: Self-pay | Admitting: Obstetrics and Gynecology

## 2022-11-13 ENCOUNTER — Encounter: Payer: Self-pay | Admitting: Anesthesiology

## 2022-11-13 ENCOUNTER — Inpatient Hospital Stay: Payer: 59 | Admitting: Certified Registered Nurse Anesthetist

## 2022-11-13 ENCOUNTER — Inpatient Hospital Stay
Admission: EM | Admit: 2022-11-13 | Discharge: 2022-11-15 | DRG: 784 | Disposition: A | Discharge status: Home / Self Care | Payer: 59 | Attending: Obstetrics | Admitting: Obstetrics

## 2022-11-13 ENCOUNTER — Encounter
Admission: EM | Disposition: A | Discharge status: Home / Self Care | Payer: Self-pay | Source: Home / Self Care | Attending: Obstetrics

## 2022-11-13 DIAGNOSIS — Z302 Encounter for sterilization: Secondary | ICD-10-CM | POA: Diagnosis not present

## 2022-11-13 DIAGNOSIS — O1002 Pre-existing essential hypertension complicating childbirth: Principal | ICD-10-CM | POA: Diagnosis present

## 2022-11-13 DIAGNOSIS — O34211 Maternal care for low transverse scar from previous cesarean delivery: Secondary | ICD-10-CM | POA: Diagnosis present

## 2022-11-13 DIAGNOSIS — Z98891 History of uterine scar from previous surgery: Secondary | ICD-10-CM

## 2022-11-13 DIAGNOSIS — O9832 Other infections with a predominantly sexual mode of transmission complicating childbirth: Secondary | ICD-10-CM | POA: Diagnosis present

## 2022-11-13 DIAGNOSIS — Z7982 Long term (current) use of aspirin: Secondary | ICD-10-CM

## 2022-11-13 DIAGNOSIS — A6000 Herpesviral infection of urogenital system, unspecified: Secondary | ICD-10-CM | POA: Diagnosis present

## 2022-11-13 DIAGNOSIS — Z91040 Latex allergy status: Secondary | ICD-10-CM

## 2022-11-13 DIAGNOSIS — D62 Acute posthemorrhagic anemia: Secondary | ICD-10-CM | POA: Diagnosis not present

## 2022-11-13 DIAGNOSIS — O10919 Unspecified pre-existing hypertension complicating pregnancy, unspecified trimester: Principal | ICD-10-CM | POA: Diagnosis present

## 2022-11-13 DIAGNOSIS — O99214 Obesity complicating childbirth: Secondary | ICD-10-CM | POA: Diagnosis present

## 2022-11-13 DIAGNOSIS — Z3A37 37 weeks gestation of pregnancy: Secondary | ICD-10-CM | POA: Diagnosis not present

## 2022-11-13 DIAGNOSIS — O9081 Anemia of the puerperium: Secondary | ICD-10-CM | POA: Diagnosis not present

## 2022-11-13 LAB — PROTEIN / CREATININE RATIO, URINE
Creatinine, Urine: 80 mg/dL
Total Protein, Urine: <6 mg/dL

## 2022-11-13 LAB — COMPREHENSIVE METABOLIC PANEL
ALT: 10 U/L (ref 0–44)
AST: 20 U/L (ref 15–41)
Albumin: 3 g/dL — ABNORMAL LOW (ref 3.5–5.0)
Alkaline Phosphatase: 171 U/L — ABNORMAL HIGH (ref 38–126)
Anion gap: 7 (ref 5–15)
BUN: 9 mg/dL (ref 6–20)
CO2: 22 mmol/L (ref 22–32)
Calcium: 8.7 mg/dL — ABNORMAL LOW (ref 8.9–10.3)
Chloride: 105 mmol/L (ref 98–111)
Creatinine, Ser: 0.49 mg/dL (ref 0.44–1.00)
GFR, Estimated: >60 mL/min (ref >60)
Glucose, Bld: 64 mg/dL — ABNORMAL LOW (ref 70–99)
Potassium: 4 mmol/L (ref 3.5–5.1)
Sodium: 134 mmol/L — ABNORMAL LOW (ref 135–145)
Total Bilirubin: 0.5 mg/dL (ref 0.3–1.2)
Total Protein: 6.9 g/dL (ref 6.5–8.1)

## 2022-11-13 LAB — CBC
HCT: 33 % — ABNORMAL LOW (ref 36.0–46.0)
Hemoglobin: 11 g/dL — ABNORMAL LOW (ref 12.0–15.0)
MCH: 27.9 pg (ref 26.0–34.0)
MCHC: 33.3 g/dL (ref 30.0–36.0)
MCV: 83.8 fL (ref 80.0–100.0)
Platelets: 340 10*3/uL (ref 150–400)
RBC: 3.94 MIL/uL (ref 3.87–5.11)
RDW: 13.4 % (ref 11.5–15.5)
WBC: 9.4 10*3/uL (ref 4.0–10.5)
nRBC: 0 % (ref 0.0–0.2)

## 2022-11-13 LAB — TYPE AND SCREEN
ABO/RH(D): A POS
Antibody Screen: NEGATIVE

## 2022-11-13 LAB — URINE DRUG SCREEN, QUALITATIVE (ARMC ONLY)
Amphetamines, Ur Screen: NOT DETECTED
Barbiturates, Ur Screen: NOT DETECTED
Benzodiazepine, Ur Scrn: NOT DETECTED
Cannabinoid 50 Ng, Ur ~~LOC~~: NOT DETECTED
Cocaine Metabolite,Ur ~~LOC~~: NOT DETECTED
MDMA (Ecstasy)Ur Screen: NOT DETECTED
Methadone Scn, Ur: NOT DETECTED
Opiate, Ur Screen: NOT DETECTED
Phencyclidine (PCP) Ur S: NOT DETECTED
Tricyclic, Ur Screen: NOT DETECTED

## 2022-11-13 SURGERY — Surgical Case
Anesthesia: Spinal | Laterality: Bilateral

## 2022-11-13 MED ORDER — BUPIVACAINE HCL (PF) 0.5 % IJ SOLN
INTRAMUSCULAR | Status: AC
  Filled 2022-11-13: qty 60

## 2022-11-13 MED ORDER — ACETAMINOPHEN 500 MG PO TABS: 1000.0000 mg | ORAL_TABLET | Freq: Four times a day (QID) | ORAL | Status: DC | PRN

## 2022-11-13 MED ORDER — CEFAZOLIN SODIUM-DEXTROSE 2-4 GM/100ML-% IV SOLN
2.0000 g | INTRAVENOUS | Status: DC
  Administered 2022-11-13: 2 g via INTRAVENOUS

## 2022-11-13 MED ORDER — AMMONIA AROMATIC IN INHA
RESPIRATORY_TRACT | Status: AC
  Filled 2022-11-13: qty 10

## 2022-11-13 MED ORDER — LACTATED RINGERS IV SOLN: Freq: Once | INTRAVENOUS | Status: AC

## 2022-11-13 MED ORDER — BUPIVACAINE IN DEXTROSE 0.75-8.25 % IT SOLN
INTRATHECAL | Status: DC | PRN
  Administered 2022-11-13: 1.6 mL via INTRATHECAL

## 2022-11-13 MED ORDER — ALPRAZOLAM 0.25 MG PO TABS
0.2500 mg | ORAL_TABLET | Freq: Once | ORAL | Status: AC
  Administered 2022-11-13: 0.25 mg via ORAL
  Filled 2022-11-13: qty 1

## 2022-11-13 MED ORDER — OXYTOCIN 10 UNIT/ML IJ SOLN
INTRAMUSCULAR | Status: AC
  Filled 2022-11-13: qty 2

## 2022-11-13 MED ORDER — OXYTOCIN-SODIUM CHLORIDE 30-0.9 UT/500ML-% IV SOLN
INTRAVENOUS | Status: AC
  Filled 2022-11-13: qty 500

## 2022-11-13 MED ORDER — PHENYLEPHRINE HCL-NACL 20-0.9 MG/250ML-% IV SOLN
INTRAVENOUS | Status: DC | PRN
  Administered 2022-11-13: 40 ug/min via INTRAVENOUS

## 2022-11-13 MED ORDER — MORPHINE SULFATE (PF) 0.5 MG/ML IJ SOLN
INTRAMUSCULAR | Status: DC | PRN
  Administered 2022-11-13: .1 mg via INTRATHECAL

## 2022-11-13 MED ORDER — SODIUM CHLORIDE 0.9% FLUSH: 50.0000 mL | Freq: Once | INTRAVENOUS | Status: DC

## 2022-11-13 MED ORDER — CEFAZOLIN SODIUM-DEXTROSE 2-4 GM/100ML-% IV SOLN
INTRAVENOUS | Status: AC
  Filled 2022-11-13: qty 100

## 2022-11-13 MED ORDER — ORAL CARE MOUTH RINSE: 15.0000 mL | Freq: Once | OROMUCOSAL | Status: DC

## 2022-11-13 MED ORDER — BUPIVACAINE HCL (PF) 0.5 % IJ SOLN
30.0000 mL | Freq: Once | INTRAMUSCULAR | Status: DC
  Filled 2022-11-13: qty 30

## 2022-11-13 MED ORDER — MISOPROSTOL 200 MCG PO TABS
ORAL_TABLET | ORAL | Status: AC
  Filled 2022-11-13: qty 4

## 2022-11-13 MED ORDER — MORPHINE SULFATE (PF) 0.5 MG/ML IJ SOLN
INTRAMUSCULAR | Status: AC
  Filled 2022-11-13: qty 10

## 2022-11-13 MED ORDER — SODIUM CHLORIDE (PF) 0.9 % IJ SOLN
INTRAMUSCULAR | Status: AC
  Filled 2022-11-13: qty 50

## 2022-11-13 MED ORDER — SOD CITRATE-CITRIC ACID 500-334 MG/5ML PO SOLN
30.0000 mL | ORAL | Status: AC
  Administered 2022-11-13: 30 mL via ORAL

## 2022-11-13 MED ORDER — CALCIUM CARBONATE ANTACID 500 MG PO CHEW: 2.0000 | CHEWABLE_TABLET | ORAL | Status: DC | PRN

## 2022-11-13 MED ORDER — LIDOCAINE HCL (PF) 1 % IJ SOLN
INTRAMUSCULAR | Status: AC
  Filled 2022-11-13: qty 30

## 2022-11-13 MED ORDER — FENTANYL CITRATE (PF) 100 MCG/2ML IJ SOLN
INTRAMUSCULAR | Status: AC
  Filled 2022-11-13: qty 2

## 2022-11-13 MED ORDER — FENTANYL CITRATE (PF) 100 MCG/2ML IJ SOLN
INTRAMUSCULAR | Status: DC | PRN
  Administered 2022-11-13: 15 ug via INTRATHECAL

## 2022-11-13 MED ORDER — CHLORHEXIDINE GLUCONATE 0.12 % MT SOLN: 15.0000 mL | Freq: Once | OROMUCOSAL | Status: DC

## 2022-11-13 MED ORDER — BUPIVACAINE HCL (PF) 0.5 % IJ SOLN
INTRAMUSCULAR | Status: AC
  Filled 2022-11-13: qty 30

## 2022-11-13 MED ORDER — SOD CITRATE-CITRIC ACID 500-334 MG/5ML PO SOLN
ORAL | Status: AC
  Filled 2022-11-13: qty 15

## 2022-11-13 MED ORDER — OXYTOCIN-SODIUM CHLORIDE 30-0.9 UT/500ML-% IV SOLN
INTRAVENOUS | Status: DC | PRN
  Administered 2022-11-13: 1 mL via INTRAVENOUS

## 2022-11-13 SURGICAL SUPPLY — 29 items
CHLORAPREP W/TINT 26 (MISCELLANEOUS) ×1 IMPLANT
DRSG TELFA 3X8 NADH STRL (GAUZE/BANDAGES/DRESSINGS) ×1 IMPLANT
ELECT REM PT RETURN 9FT ADLT (ELECTROSURGICAL) ×1
ELECTRODE REM PT RTRN 9FT ADLT (ELECTROSURGICAL) ×1 IMPLANT
EXTRACTOR VACUUM KIWI (MISCELLANEOUS) IMPLANT
GAUZE SPONGE 4X4 12PLY STRL (GAUZE/BANDAGES/DRESSINGS) ×1 IMPLANT
GOWN STRL REUS W/ TWL LRG LVL3 (GOWN DISPOSABLE) ×3 IMPLANT
GOWN STRL REUS W/TWL LRG LVL3 (GOWN DISPOSABLE) ×3
MANIFOLD NEPTUNE II (INSTRUMENTS) ×1 IMPLANT
MAT PREVALON FULL STRYKER (MISCELLANEOUS) ×1 IMPLANT
NDL HYPO 25GX1X1/2 BEV (NEEDLE) ×1 IMPLANT
NEEDLE HYPO 25GX1X1/2 BEV (NEEDLE) ×1 IMPLANT
NS IRRIG 1000ML POUR BTL (IV SOLUTION) ×1 IMPLANT
PACK C SECTION AR (MISCELLANEOUS) ×1 IMPLANT
PAD OB MATERNITY 4.3X12.25 (PERSONAL CARE ITEMS) ×1 IMPLANT
PAD PREP 24X41 OB/GYN DISP (PERSONAL CARE ITEMS) ×1 IMPLANT
SCRUB CHG 4% DYNA-HEX 4OZ (MISCELLANEOUS) ×1 IMPLANT
STAPLER INSORB 30 2030 C-SECTI (MISCELLANEOUS) IMPLANT
SUT MNCRL 4-0 (SUTURE) ×1
SUT MNCRL 4-0 27XMFL (SUTURE) ×1
SUT VIC AB 0 CT1 36 (SUTURE) ×2 IMPLANT
SUT VIC AB 0 CTX 36 (SUTURE) ×2
SUT VIC AB 0 CTX36XBRD ANBCTRL (SUTURE) ×2 IMPLANT
SUT VIC AB 2-0 SH 27 (SUTURE) ×2
SUT VIC AB 2-0 SH 27XBRD (SUTURE) ×2 IMPLANT
SUTURE MNCRL 4-0 27XMF (SUTURE) ×1 IMPLANT
SYR 30ML LL (SYRINGE) ×2 IMPLANT
TRAP FLUID SMOKE EVACUATOR (MISCELLANEOUS) ×1 IMPLANT
WATER STERILE IRR 500ML POUR (IV SOLUTION) ×1 IMPLANT

## 2022-11-13 NOTE — Anesthesia Preprocedure Evaluation (Deleted)
Anesthesia Evaluation  Patient identified by MRN, date of birth, ID band Patient awake    Reviewed: Allergy & Precautions, H&P , NPO status , Patient's Chart, lab work & pertinent test results, reviewed documented beta blocker date and time   Airway Mallampati: II  TM Distance: >3 FB Neck ROM: full    Dental no notable dental hx. (+) Teeth Intact   Pulmonary asthma    Pulmonary exam normal breath sounds clear to auscultation       Cardiovascular Exercise Tolerance: Good hypertension, On Medications + dysrhythmias + Valvular Problems/Murmurs  Rhythm:regular Rate:Normal     Neuro/Psych   Anxiety Depression    negative neurological ROS  negative psych ROS   GI/Hepatic Neg liver ROS,GERD  Medicated,,  Endo/Other  negative endocrine ROSdiabetes    Renal/GU Renal disease  negative genitourinary   Musculoskeletal   Abdominal   Peds  Hematology negative hematology ROS (+)   Anesthesia Other Findings   Reproductive/Obstetrics (+) Pregnancy                             Anesthesia Physical Anesthesia Plan  ASA: 3 and emergent  Anesthesia Plan: Spinal   Post-op Pain Management:    Induction:   PONV Risk Score and Plan: 3  Airway Management Planned:   Additional Equipment:   Intra-op Plan:   Post-operative Plan:   Informed Consent: I have reviewed the patients History and Physical, chart, labs and discussed the procedure including the risks, benefits and alternatives for the proposed anesthesia with the patient or authorized representative who has indicated his/her understanding and acceptance.       Plan Discussed with:   Anesthesia Plan Comments:        Anesthesia Quick Evaluation

## 2022-11-13 NOTE — OB Triage Note (Signed)
Pt is a 31yo G3P2002, 37w 6d. She arrived to the unit with complaints of blurry vision/floaters, right epigastric pain that is 8/10 at times and described as a pinching sensation, and a HA for the past 2 weeks. She denies vaginal bleeding, reports positive fetal movement, and reports irregular contractions. VS stable, monitors applied and assessing.   Initial FHT 135 at 1541.

## 2022-11-13 NOTE — H&P (Addendum)
OB History & Physical   History of Present Illness:   Chief Complaint: headache and blurred vision  HPI:  KERRIS LAUT is a 31 y.o. G46P2002 female at [redacted]w[redacted]d dated by a 5 week ultrasound.  Her pregnancy has been complicated by history of c-section x 2, HSV, history of preeclampsia x 2, chronic hypertension.    She presented to L&D triage today with reports of worsening headache over the past 2 days, blurred vision, and multiple episodes of seeing floaters.  She also reports new onset epigastric discomfort.  She denies contractions, LOF, or vaginal bleeding.  Endorses good fetal movement.    Reports active fetal movement  Contractions: denies  LOF/SROM: denies  Vaginal bleeding: denies   Factors complicating pregnancy:  Chronic hypertension Previous c/section x 2 History of preeclampsia x 2 Hyperemesis gravidarum  Marijuana use in pregnancy - stopped 2nd trimester Chlamydia in pregnancy - TOC neg  HSV - on valtrex   Patient Active Problem List   Diagnosis Date Noted   History of cesarean delivery 11/13/2022   Chronic hypertension affecting pregnancy 09/01/2022   Marijuana use during pregnancy 06/14/2022   Hyperemesis complicating pregnancy, antepartum 05/09/2022   Supervision of high risk pregnancy in third trimester 04/22/2022   Anxiety and depression 01/19/2021   IBS (irritable bowel syndrome) 01/26/2018   HLD (hyperlipidemia) 01/26/2018   Previous cesarean section 03/14/2017   Herpes genitalis 03/14/2017    Prenatal Transfer Tool  Maternal Diabetes: No Genetic Screening: Declined Maternal Ultrasounds/Referrals: Normal Fetal Ultrasounds or other Referrals:  None Maternal Substance Abuse:  Yes:  Type: Marijuana - stopped 2nd trimester  Significant Maternal Medications:  Meds include: Other: Valtrex Significant Maternal Lab Results: Group B Strep negative  Maternal Medical History:   Past Medical History:  Diagnosis Date   Anxiety    Asthma    EXERCISE  INDUCED-NO INHALER NOW   Chronic cholecystitis without calculus    Depression    Dysrhythmia    TACHYCARDIA WITH 1ST PREGNANCY-ECHO DONE AND WAS WNL-ASYMPTOMATIC NOW   Family history of adverse reaction to anesthesia    MOM- N/V   Genital herpes    GERD (gastroesophageal reflux disease)    H/O WITH PREGNANCY   Heart murmur    AS A CHILD-ASYMPTOMATIC   History of kidney stones    Hyperlipidemia    Hypertension    pregnancy induced   IBS (irritable bowel syndrome)    Kidney stones 05/09/2017   Obesity (BMI 30-39.9)    Postpartum depression associated with first pregnancy     Past Surgical History:  Procedure Laterality Date   CESAREAN SECTION  12/21/2014   [place], [place]   CESAREAN SECTION N/A 05/27/2017   Procedure: CESAREAN SECTION;  Surgeon: Gae Dry, MD;  Location: [facility] ORS;  Service: Obstetrics;  Laterality: N/A;   CHOLECYSTECTOMY N/A 05/15/2018   Procedure: LAPAROSCOPIC CHOLECYSTECTOMY WITH INTRAOPERATIVE CHOLANGIOGRAM;  Surgeon: Robert Bellow, MD;  Location: [facility] ORS;  Service: General;  Laterality: N/A;   CYSTOSCOPY/URETEROSCOPY/HOLMIUM LASER/STENT PLACEMENT Right 02/27/2018   Procedure: CYSTOSCOPY/URETEROSCOPY/HOLMIUM LASER/STENT PLACEMENT;  Surgeon: Abbie Sons, MD;  Location: [facility] ORS;  Service: Urology;  Laterality: Right;   OTHER SURGICAL HISTORY     mole bx    WISDOM TOOTH EXTRACTION      Allergies  Allergen Reactions   Codeine Nausea Only and Nausea And Vomiting   Shellfish Allergy Hives    hives   Latex Rash    Prior to Admission medications   Medication Sig Start Date  End Date Taking? Authorizing Provider  aspirin EC 81 MG tablet Take 81 mg by mouth daily. Swallow whole.   Yes [provider]  famotidine (PEPCID) 20 MG tablet Take 1 tablet by mouth 2 (two) times daily. 05/10/22 05/10/23 Yes [provider]  ondansetron (ZOFRAN-ODT) 4 MG disintegrating tablet Take 4 mg by mouth every 8 (eight) hours as needed for nausea.  06/14/22  Yes [provider]  Prenatal Vit-Fe Fumarate-FA (MULTIVITAMIN-PRENATAL) 27-0.8 MG TABS tablet Take 1 tablet by mouth daily at 12 noon.   Yes [provider]  valACYclovir (VALTREX) 500 MG tablet Take 500 mg by mouth daily.   Yes [provider]     Prenatal care site:  [facility]  OB History  Gravida Para Term Preterm AB Living  3 2 2  0 0 2  SAB IAB Ectopic Multiple Live Births  0 0 0 0 2    # Outcome Date GA Lbr Len/2nd Weight Sex Delivery Anes PTL Lv  3 Current           2 Term 05/27/17 [redacted]w[redacted]d  3462 g M CS-LTranv  N LIV     Complications: Preeclampsia, third trimester     Apgar1: 8  Apgar5: 9  1 Term 12/21/14 [redacted]w[redacted]d  3572 g F CS-LTranv  N LIV     Complications: Herpes genitalia, Severe preeclampsia     Name: Berkley    Obstetric Comments  Preeclampsia with G1 and 2  1st Menstrual Cycle:  8   1st Pregnancy:  51     Social History: She  reports that she has never smoked. She has never used smokeless tobacco. She reports that she does not drink alcohol and does not use drugs.  Family History: family history includes Arthritis in her paternal grandfather; Breast cancer in her maternal grandmother; Crohn's disease in her paternal aunt; Depression in her mother; Diabetes in her maternal grandfather and another family member; Diverticulosis in her mother; GER disease in her paternal aunt; Hearing loss in her father; Heart disease in her maternal grandfather; Hyperlipidemia in her father and mother; Hypertension in her father and mother; Irritable bowel syndrome in her father and paternal aunt; Miscarriages / Stillbirths in her maternal grandmother.   Review of Systems: A full review of systems was performed and negative except as noted in the HPI.     Physical Exam:  Vital Signs: BP 134/81 (BP Location: Left Arm)   Pulse (!) 103   Temp 98.3 F (36.8 C) (Oral)   Resp 19   Ht 5' (1.524 m)   Wt 76.7 kg   BMI 33.01 kg/m   General:  no acute distress.  HEENT: normocephalic, atraumatic Heart: regular rate & rhythm Lungs: normal respiratory effort Abdomen: soft, gravid, non-tender;  EFW: 7lbs  Pelvic: deferred Extremities: non-tender, symmetric, No edema bilaterally.  DTRs: 2+/2+  Neurologic: Alert & oriented x 3.    Results for orders placed or performed during the hospital encounter of 11/13/22 (from the past 24 hour(s))  Comprehensive metabolic panel     Status: Abnormal   Collection Time: 11/13/22  3:36 PM  Result Value Ref Range   Sodium 134 (L) 135 - 145 mmol/L   Potassium 4.0 3.5 - 5.1 mmol/L   Chloride 105 98 - 111 mmol/L   CO2 22 22 - 32 mmol/L   Glucose, Bld 64 (L) 70 - 99 mg/dL   BUN 9 6 - 20 mg/dL   Creatinine, Ser 11/15/22 0.44 - 1.00 mg/dL  Calcium 8.7 (L) 8.9 - 10.3 mg/dL   Total Protein 6.9 6.5 - 8.1 g/dL   Albumin 3.0 (L) 3.5 - 5.0 g/dL   AST 20 15 - 41 U/L   ALT 10 0 - 44 U/L   Alkaline Phosphatase 171 (H) 38 - 126 U/L   Total Bilirubin 0.5 0.3 - 1.2 mg/dL   GFR, Estimated >60 >60 mL/min   Anion gap 7 5 - 15  CBC     Status: Abnormal   Collection Time: 11/13/22  3:36 PM  Result Value Ref Range   WBC 9.4 4.0 - 10.5 K/uL   RBC 3.94 3.87 - 5.11 MIL/uL   Hemoglobin 11.0 (L) 12.0 - 15.0 g/dL   HCT 33.0 (L) 36.0 - 46.0 %   MCV 83.8 80.0 - 100.0 fL   MCH 27.9 26.0 - 34.0 pg   MCHC 33.3 30.0 - 36.0 g/dL   RDW 13.4 11.5 - 15.5 %   Platelets 340 150 - 400 K/uL   nRBC 0.0 0.0 - 0.2 %  Protein / creatinine ratio, urine     Status: None   Collection Time: 11/13/22  3:54 PM  Result Value Ref Range   Creatinine, Urine 80 mg/dL   Total Protein, Urine <6 mg/dL   Protein Creatinine Ratio        0.00 - 0.15 mg/mg[Cre]  Type and screen [place]     Status: None   Collection Time: 11/13/22  7:24 PM  Result Value Ref Range   ABO/RH(D) A POS    Antibody Screen NEG    Sample Expiration      11/16/2022,2359 Performed at [facility], [address] [place],  [place] [address]   Urine Drug Screen, Qualitative ([place] only)     Status: None   Collection Time: 11/13/22  7:26 PM  Result Value Ref Range   Tricyclic, Ur Screen NONE DETECTED NONE DETECTED   Amphetamines, Ur Screen NONE DETECTED NONE DETECTED   MDMA (Ecstasy)Ur Screen NONE DETECTED NONE DETECTED   Cocaine Metabolite,Ur [place] NONE DETECTED NONE DETECTED   Opiate, Ur Screen NONE DETECTED NONE DETECTED   Phencyclidine (PCP) Ur S NONE DETECTED NONE DETECTED   Cannabinoid 50 Ng, Ur [place] NONE DETECTED NONE DETECTED   Barbiturates, Ur Screen NONE DETECTED NONE DETECTED   Benzodiazepine, Ur Scrn NONE DETECTED NONE DETECTED   Methadone Scn, Ur NONE DETECTED NONE DETECTED    Pertinent Results:  Prenatal Labs: Blood type/Rh A POS    Antibody screen Negative    Rubella Immune    Varicella Immune  RPR NR    HBsAg NR   Hep C NR   HIV NR    GC neg  Chlamydia Pos - TOC neg  Genetic screening Declined   1 hour GTT 94  3 hour GTT N/A  GBS Neg     FHT:  FHR: 140 bpm, variability: moderate,  accelerations:  Present,  decelerations:  Absent Category/reactivity:  Category I UC:   regular, every 2-4 minutes   Cephalic by Le[place]olds and SVE   No results found.  Assessment:  MERRIAM REVEAL is a 31 y.o. G25P2002 female at [redacted]w[redacted]d with chronic hypertension.   Plan:  1. Admit to Labor & Delivery - consents reviewed and obtained - Dr. Leafy Ro notified of admission and plan of care  - Discussed concerning symptoms of Preeclampsia that have gotten worse over the past 2 days.  Preeclampsia labs were normal but Dailin has had several mild range  BP's, headache, and changes in vision.  Benefits of delivery at this time outweigh risks of continued pregnancy with cHTN new onset sx, and risk of preeclampsia at term.  2. Fetal Well being  - Fetal Tracing: cat 1 - Group B Streptococcus ppx not indicated: GBS negative - Presentation: cephalic confirmed by Leopolds    3. Routine OB: - Prenatal labs reviewed, as  above - Rh positive - CBC, T&S, RPR on admit - NPO, continuous IV fluids  4. Repeat cesarean section and BTL  - Plan for repeat c/section and BTL 8 hours NPO  - Anesthesia per OR team  - Consent for BTL signed 09/13/2022  5. Post Partum Planning: - Infant feeding:  TBD - Contraception: bilateral tubal ligation - Tdap vaccine: Given prenatally - Flu vaccine:  declined    Patient desires permanent sterilization.  Other reversible forms of contraception were discussed with patient; she declines all other modalities. Risks of procedure discussed with patient including but not limited to: risk of regret, permanence of method, bleeding, infection, injury to surrounding organs and need for additional procedures.  Failure risk of 1-2 % with increased risk of ectopic gestation if pregnancy occurs was also discussed with patient.  Patient verbalized understanding of these risks and wants to proceed with sterilization.  Written informed consent obtained.  To OR when ready.  Benjaman Kindler, MD 11/13/22 8:38 PM  Drinda Butts, CNM Certified Nurse Midwife [facility]

## 2022-11-13 NOTE — Anesthesia Procedure Notes (Signed)
Spinal  Patient location during procedure: OR End time: 11/13/2022 10:54 PM Reason for block: surgical anesthesia Staffing Performed: anesthesiologist  Anesthesiologist: Yevette Edwards, MD Performed by: Waldo Laine, CRNA Authorized by: Yevette Edwards, MD   Preanesthetic Checklist Completed: patient identified, IV checked, site marked, risks and benefits discussed, surgical consent, monitors and equipment checked, pre-op evaluation and timeout performed Spinal Block Patient position: sitting Prep: DuraPrep Patient monitoring: heart rate, cardiac monitor, continuous pulse ox and blood pressure Approach: midline Location: L4-5 Injection technique: single-shot Needle Needle type: Sprotte  Needle gauge: 25 G Needle length: 9 cm Assessment Sensory level: T4 Events: CSF return Additional Notes Tolerated sab without difficulty. Vsst . Fawn Kirk

## 2022-11-14 ENCOUNTER — Encounter: Payer: Self-pay | Admitting: Obstetrics and Gynecology

## 2022-11-14 LAB — CBC
HCT: 30.6 % — ABNORMAL LOW (ref 36.0–46.0)
Hemoglobin: 10.1 g/dL — ABNORMAL LOW (ref 12.0–15.0)
MCH: 27.8 pg (ref 26.0–34.0)
MCHC: 33 g/dL (ref 30.0–36.0)
MCV: 84.3 fL (ref 80.0–100.0)
Platelets: 298 10*3/uL (ref 150–400)
RBC: 3.63 MIL/uL — ABNORMAL LOW (ref 3.87–5.11)
RDW: 13.4 % (ref 11.5–15.5)
WBC: 13.3 10*3/uL — ABNORMAL HIGH (ref 4.0–10.5)
nRBC: 0 % (ref 0.0–0.2)

## 2022-11-14 LAB — RPR: RPR Ser Ql: NONREACTIVE

## 2022-11-14 MED ORDER — DIBUCAINE (PERIANAL) 1 % EX OINT: 1.0000 | TOPICAL_OINTMENT | CUTANEOUS | Status: DC | PRN

## 2022-11-14 MED ORDER — DIPHENHYDRAMINE HCL 50 MG/ML IJ SOLN: 12.5000 mg | INTRAMUSCULAR | Status: DC | PRN

## 2022-11-14 MED ORDER — NALOXONE HCL 0.4 MG/ML IJ SOLN: 0.4000 mg | INTRAMUSCULAR | Status: DC | PRN

## 2022-11-14 MED ORDER — IBUPROFEN 600 MG PO TABS
600.0000 mg | ORAL_TABLET | Freq: Four times a day (QID) | ORAL | Status: DC
  Administered 2022-11-15 (×2): 600 mg via ORAL
  Filled 2022-11-14 (×2): qty 1

## 2022-11-14 MED ORDER — DIPHENHYDRAMINE HCL 25 MG PO CAPS: 25.0000 mg | ORAL_CAPSULE | ORAL | Status: DC | PRN

## 2022-11-14 MED ORDER — SODIUM CHLORIDE 0.9% FLUSH: 3.0000 mL | INTRAVENOUS | Status: DC | PRN

## 2022-11-14 MED ORDER — KETOROLAC TROMETHAMINE 30 MG/ML IJ SOLN: 30.0000 mg | Freq: Four times a day (QID) | INTRAMUSCULAR | Status: AC | PRN

## 2022-11-14 MED ORDER — ONDANSETRON HCL 4 MG/2ML IJ SOLN: 4.0000 mg | Freq: Once | INTRAMUSCULAR | Status: DC | PRN

## 2022-11-14 MED ORDER — SIMETHICONE 80 MG PO CHEW: 80.0000 mg | CHEWABLE_TABLET | ORAL | Status: DC | PRN

## 2022-11-14 MED ORDER — MEPERIDINE HCL 25 MG/ML IJ SOLN: 6.2500 mg | INTRAMUSCULAR | Status: DC | PRN

## 2022-11-14 MED ORDER — PRENATAL MULTIVITAMIN CH
1.0000 | ORAL_TABLET | Freq: Every day | ORAL | Status: DC
  Administered 2022-11-14 – 2022-11-15 (×2): 1 via ORAL
  Filled 2022-11-14 (×2): qty 1

## 2022-11-14 MED ORDER — WITCH HAZEL-GLYCERIN EX PADS: 1.0000 | MEDICATED_PAD | CUTANEOUS | Status: DC | PRN

## 2022-11-14 MED ORDER — NALOXONE HCL 4 MG/10ML IJ SOLN: 1.0000 ug/kg/h | INTRAVENOUS | Status: DC | PRN

## 2022-11-14 MED ORDER — LACTATED RINGERS IV SOLN: INTRAVENOUS | Status: DC | PRN

## 2022-11-14 MED ORDER — GABAPENTIN 300 MG PO CAPS
300.0000 mg | ORAL_CAPSULE | Freq: Every day | ORAL | Status: DC
  Administered 2022-11-14: 300 mg via ORAL
  Filled 2022-11-14: qty 1

## 2022-11-14 MED ORDER — ONDANSETRON HCL 4 MG/2ML IJ SOLN: 4.0000 mg | Freq: Three times a day (TID) | INTRAMUSCULAR | Status: DC | PRN

## 2022-11-14 MED ORDER — ONDANSETRON 4 MG PO TBDP: 4.0000 mg | ORAL_TABLET | Freq: Three times a day (TID) | ORAL | Status: DC | PRN

## 2022-11-14 MED ORDER — BISACODYL 10 MG RE SUPP: 10.0000 mg | Freq: Every day | RECTAL | Status: DC | PRN

## 2022-11-14 MED ORDER — KETOROLAC TROMETHAMINE 30 MG/ML IJ SOLN
30.0000 mg | Freq: Four times a day (QID) | INTRAMUSCULAR | Status: AC
  Administered 2022-11-14 (×4): 30 mg via INTRAVENOUS
  Filled 2022-11-14 (×4): qty 1

## 2022-11-14 MED ORDER — FERROUS SULFATE 325 (65 FE) MG PO TABS
325.0000 mg | ORAL_TABLET | Freq: Two times a day (BID) | ORAL | Status: DC
  Administered 2022-11-14 – 2022-11-15 (×3): 325 mg via ORAL
  Filled 2022-11-14 (×3): qty 1

## 2022-11-14 MED ORDER — MENTHOL 3 MG MT LOZG: 1.0000 | LOZENGE | OROMUCOSAL | Status: DC | PRN

## 2022-11-14 MED ORDER — PAROXETINE HCL 10 MG PO TABS
10.0000 mg | ORAL_TABLET | Freq: Every day | ORAL | Status: DC
  Administered 2022-11-14 – 2022-11-15 (×2): 10 mg via ORAL
  Filled 2022-11-14 (×2): qty 1

## 2022-11-14 MED ORDER — FAMOTIDINE 20 MG PO TABS
20.0000 mg | ORAL_TABLET | Freq: Two times a day (BID) | ORAL | Status: DC
  Administered 2022-11-14 – 2022-11-15 (×4): 20 mg via ORAL
  Filled 2022-11-14 (×4): qty 1

## 2022-11-14 MED ORDER — TETANUS-DIPHTH-ACELL PERTUSSIS 5-2.5-18.5 LF-MCG/0.5 IM SUSY
0.5000 mL | PREFILLED_SYRINGE | Freq: Once | INTRAMUSCULAR | Status: DC
  Filled 2022-11-14: qty 0.5

## 2022-11-14 MED ORDER — DIPHENHYDRAMINE HCL 25 MG PO CAPS: 25.0000 mg | ORAL_CAPSULE | Freq: Four times a day (QID) | ORAL | Status: DC | PRN

## 2022-11-14 MED ORDER — BUPIVACAINE HCL (PF) 0.5 % IJ SOLN
INTRAMUSCULAR | Status: DC | PRN
  Administered 2022-11-14: 30 mL

## 2022-11-14 MED ORDER — FLEET ENEMA 7-19 GM/118ML RE ENEM: 1.0000 | ENEMA | Freq: Every day | RECTAL | Status: DC | PRN

## 2022-11-14 MED ORDER — ACETAMINOPHEN 500 MG PO TABS: 1000.0000 mg | ORAL_TABLET | Freq: Four times a day (QID) | ORAL | Status: DC

## 2022-11-14 MED ORDER — SIMETHICONE 80 MG PO CHEW
80.0000 mg | CHEWABLE_TABLET | Freq: Three times a day (TID) | ORAL | Status: DC
  Administered 2022-11-14 – 2022-11-15 (×4): 80 mg via ORAL
  Filled 2022-11-14 (×4): qty 1

## 2022-11-14 MED ORDER — SENNOSIDES-DOCUSATE SODIUM 8.6-50 MG PO TABS
2.0000 | ORAL_TABLET | ORAL | Status: DC
  Administered 2022-11-14 – 2022-11-15 (×2): 2 via ORAL
  Filled 2022-11-14 (×2): qty 2

## 2022-11-14 MED ORDER — FENTANYL CITRATE (PF) 100 MCG/2ML IJ SOLN: 25.0000 ug | INTRAMUSCULAR | Status: DC | PRN

## 2022-11-14 MED ORDER — LACTATED RINGERS IV SOLN: INTRAVENOUS | Status: DC

## 2022-11-14 MED ORDER — ACETAMINOPHEN 500 MG PO TABS
1000.0000 mg | ORAL_TABLET | Freq: Four times a day (QID) | ORAL | Status: DC
  Administered 2022-11-14 – 2022-11-15 (×5): 1000 mg via ORAL
  Filled 2022-11-14 (×6): qty 2

## 2022-11-14 MED ORDER — OXYCODONE HCL 5 MG PO TABS
5.0000 mg | ORAL_TABLET | ORAL | Status: DC | PRN
  Administered 2022-11-14: 5 mg via ORAL
  Filled 2022-11-14: qty 1

## 2022-11-14 MED ORDER — MEASLES, MUMPS & RUBELLA VAC IJ SOLR
0.5000 mL | Freq: Once | INTRAMUSCULAR | Status: DC
  Filled 2022-11-14: qty 0.5

## 2022-11-14 MED ORDER — OXYTOCIN-SODIUM CHLORIDE 30-0.9 UT/500ML-% IV SOLN: 2.5000 [IU]/h | INTRAVENOUS | Status: AC

## 2022-11-14 MED ORDER — PRENATAL MULTIVITAMIN CH: 1.0000 | ORAL_TABLET | Freq: Every day | ORAL | Status: DC

## 2022-11-14 MED ORDER — COCONUT OIL OIL: 1.0000 | TOPICAL_OIL | Status: DC | PRN

## 2022-11-14 NOTE — Discharge Summary (Signed)
Obstetrical Discharge Summary  Patient Name: Megan Kramer DOB: 12/29/1990 MRN: [id number]  Date of Admission: 11/13/2022 Date of Delivery: 11/13/2022 Delivered by: Dr. Christeen Douglas  Date of Discharge: 11/15/2022  Primary OB: [facility] LMP:No LMP recorded. EDC Estimated Date of Delivery: 11/28/22 Gestational Age at Delivery: [redacted]w[redacted]d   Antepartum complications:  Chronic hypertension Previous c/section x 2 History of preeclampsia x 2 Hyperemesis gravidarum  Marijuana use in pregnancy - stopped 2nd trimester Chlamydia in pregnancy - TOC neg  HSV - on valtrex   Admitting Diagnosis: Chronic hypertension affecting pregnancy [O10.919] History of cesarean delivery [Z98.891]  Secondary Diagnosis: Patient Active Problem List   Diagnosis Date Noted   History of cesarean delivery 11/13/2022   Chronic hypertension affecting pregnancy 09/01/2022   Marijuana use during pregnancy 06/14/2022   Hyperemesis complicating pregnancy, antepartum 05/09/2022   Supervision of high risk pregnancy in third trimester 04/22/2022   Anxiety and depression 01/19/2021   IBS (irritable bowel syndrome) 01/26/2018   HLD (hyperlipidemia) 01/26/2018   Previous cesarean section 03/14/2017   Herpes genitalis 03/14/2017    Discharge Diagnosis: Term Pregnancy Delivered and CHTN      Augmentation: N/A Complications: None Intrapartum complications/course: Magalene presented to L&D with reports of worsening headache over the past 2 days, blurred vision, and multiple episodes of seeing floaters.  Decision made to proceed with repeat c/section and BTL d/t benefits of delivery at this time versus risks of continued pregnancy with cHTN and new onset sx, and risk of preeclampsia at term.  Please see OP note for further details.  Delivery Type: repeat cesarean section, low transverse incision Anesthesia: spinal anesthesia Placenta: manual removal To Pathology: No  Laceration: none Episiotomy:  none Newborn Data: Live born female 15 Birth Weight:  6lbs 8oz APGAR: 8, 9   Newborn Delivery   Birth date/time: 11/13/2022 23:29:00 Delivery type: repeat cesarean section      Postpartum Procedures: none Edinburgh:     11/14/2022    4:08 PM 05/30/2017   10:00 AM  Edinburgh Postnatal Depression Scale Screening Tool  I have been able to laugh and see the funny side of things. 0 0  I have looked forward with enjoyment to things. 0 0  I have blamed myself unnecessarily when things went wrong. 1 0  I have been anxious or worried for no good reason. 2 0  I have felt scared or panicky for no good reason. 0 0  Things have been getting on top of me. 1 0  I have been so unhappy that I have had difficulty sleeping. 0 0  I have felt sad or miserable. 0 0  I have been so unhappy that I have been crying. 0 0  The thought of harming myself has occurred to me. 0 0  Edinburgh Postnatal Depression Scale Total 4 0     Post partum course:  Patient had an uncomplicated postpartum course   By time of discharge on POD#2, her pain was controlled on oral pain medications; she had appropriate lochia and was ambulating, voiding without difficulty, tolerating regular diet and passing flatus.   She was deemed stable for discharge to home.    Discharge Physical Exam:  BP 121/74 (BP Location: Left Arm)   Pulse 71   Temp 97.7 F (36.5 C) (Oral)   Resp 20   Ht 5' (1.524 m)   Wt 76.7 kg   SpO2 100%   Breastfeeding Unknown   BMI 33.01 kg/m   General: NAD CV: RRR  Pulm: CTABL, nl effort ABD: s/nd/nt, fundus firm and below the umbilicus Lochia: moderate Perineum:minimal edema/intact Incision: c/d/I, covered with occlusive OP site dressing  DVT Evaluation: LE non-ttp, no evidence of DVT on exam.  Hemoglobin  Date Value Ref Range Status  11/14/2022 10.1 (L) 12.0 - 15.0 g/dL Final  40/98/1191 47.8 11.1 - 15.9 g/dL Final   HCT  Date Value Ref Range Status  11/14/2022 30.6 (L) 36.0 - 46.0 %  Final   Hematocrit  Date Value Ref Range Status  03/14/2017 34.7 34.0 - 46.6 % Final    Risk assessment for postpartum VTE and prophylactic treatment: Very high risk factors: None High risk factors: None Moderate risk factors: Cesarean delivery  and BMI 30-40 kg/m2  Postpartum VTE prophylaxis with LMWH not indicated  Disposition: stable, discharge to home. Baby Feeding: formula feeding Baby Disposition: home with mom  Rh Immune globulin indicated: No Rubella vaccine given: was not indicated Varivax vaccine given: was not indicated Flu vaccine given in AP setting: declined  Tdap vaccine given in AP setting: Yes   Contraception: bilateral tubal ligation  Prenatal Labs:  Blood type/Rh A POS    Antibody screen Negative    Rubella Immune    Varicella Immune  RPR NR    HBsAg NR   Hep C NR   HIV NR    GC neg  Chlamydia Pos - TOC neg  Genetic screening Declined   1 hour GTT 94  3 hour GTT N/A  GBS Neg      Plan:  CYANNA NEACE was discharged to home in good condition. Follow-up appointment with KC in 3-5 days for blood pressure check and with delivering provider in 2 weeks for post-OP incision check.   Discharge Medications: Allergies as of 11/15/2022       Reactions   Codeine Nausea Only, Nausea And Vomiting   Shellfish Allergy Hives   hives   Latex Rash        Medication List     STOP taking these medications    aspirin EC 81 MG tablet   multivitamin-prenatal 27-0.8 MG Tabs tablet   ondansetron 4 MG disintegrating tablet Commonly known as: ZOFRAN-ODT       TAKE these medications    acetaminophen 500 MG tablet Commonly known as: TYLENOL Take 2 tablets (1,000 mg total) by mouth every 6 (six) hours.   coconut oil Oil Apply 1 Application topically as needed.   dibucaine 1 % Oint Commonly known as: NUPERCAINAL Place 1 Application rectally as needed (postpartum hemorrhoids).   famotidine 20 MG tablet Commonly known as: PEPCID Take 1  tablet by mouth 2 (two) times daily.   ferrous sulfate 325 (65 FE) MG tablet Take 1 tablet (325 mg total) by mouth 2 (two) times daily with a meal.   ibuprofen 600 MG tablet Commonly known as: ADVIL Take 1 tablet (600 mg total) by mouth every 6 (six) hours.   oxyCODONE 5 MG immediate release tablet Commonly known as: Oxy IR/ROXICODONE Take 1-2 tablets (5-10 mg total) by mouth every 4 (four) hours as needed for moderate pain.   PARoxetine 10 MG tablet Commonly known as: PAXIL Take 1 tablet (10 mg total) by mouth daily.   senna-docusate 8.6-50 MG tablet Commonly known as: Senokot-S Take 2 tablets by mouth daily. Start taking on: November 16, 2022   simethicone 80 MG chewable tablet Commonly known as: MYLICON Chew 1 tablet (80 mg total) by mouth as needed for flatulence.   valACYclovir 500  MG tablet Commonly known as: VALTREX Take 500 mg by mouth daily.   witch hazel-glycerin pad Commonly known as: TUCKS Apply 1 Application topically as needed for hemorrhoids.         Follow-up Information     Christeen Douglas, MD Follow up in 2 week(s).   Specialty: Obstetrics and Gynecology Why: For postop check/Mood check, Paxil restarted Contact information: [address] [place] [address] [phone]         [facility]. Schedule an appointment as soon as possible for a visit.   Why: 3-5 days for blood pressure check Contact information: [address] [place] [address] [phone]                Signed: Chari Manning CNM

## 2022-11-14 NOTE — Transfer of Care (Signed)
Immediate Anesthesia Transfer of Care Note  Patient: Megan Kramer  Procedure(s) Performed: REPEAT CESAREAN SECTION WITH BILATERAL TUBAL LIGATION (Bilateral)  Patient Location: PACU  Anesthesia Type:Spinal  Level of Consciousness: awake, alert , oriented, and patient cooperative  Airway & Oxygen Therapy: Patient Spontanous Breathing  Post-op Assessment: Report given to RN and Post -op Vital signs reviewed and stable  Post vital signs: Reviewed and stable  Last Vitals:  Vitals Value Taken Time  BP    Temp    Pulse    Resp    SpO2      Last Pain:  Vitals:   11/13/22 2014  TempSrc: Oral  PainSc:       Patients Stated Pain Goal: 0 (11/13/22 1545)  Complications: No notable events documented.

## 2022-11-14 NOTE — Op Note (Signed)
Cesarean Section Procedure Note   Pre-operative Diagnosis:  1. Intrauterine pregnancy at [redacted]w[redacted]d;  2. Desires permanent sterilization;  3. cHTN  Post-operative Diagnosis: same, delivered.  Procedure: 1. Low Transverse Cesarean Section through Pfannenstiel incision  2. Bilateral tubal sterilization using modified Parkland method  Surgeon: Christeen Douglas, MD  Assistant(s):  Margaretmary Eddy, CNM   Anesthesia: Spinal anesthesia  Estimated Blood Loss:           Drains: Foley         Total IV Fluids:  Urine Output: 43ml         Specimens: portion of right and left tubes         Complications:  None; patient tolerated the procedure well.         Disposition: PACU - hemodynamically stable.         Condition: stable  Findings:  A female infant in cephalic presentation. Amniotic fluid - Clear  Birth weight 2940 g.  Apgars of 8 and 9.   Intact placenta with a three-vessel cord.  Grossly normal uterus, tubes and ovaries bilaterally. Moderate intraabdominal adhesions were noted.  Procedure Details  The patient was taken to Operating Room, identified as the correct patient and the procedure verified as C-Section Delivery. A Time Out was held and the above information confirmed.  After induction of anesthesia, the patient was draped and prepped in the usual sterile manner. A Pfannenstiel incision was made and carried down through the subcutaneous tissue to the fascia. Fascial incision was made and extended transversely with the Mayo scissors. The fascia was separated from the underlying rectus tissue superiorly and inferiorly. The peritoneum was identified and entered bluntly. Peritoneal incision was extended longitudinally. The utero-vesical peritoneal reflection was incised transversely and a bladder flap was created digitally.   A low transverse hysterotomy was made. The fetus was delivered atraumatically. The umbilical cord was clamped x2 and cut and the infant was handed to  the awaiting pediatricians. The placenta was removed intact and appeared normal with a 3-vessel cord.   The uterus was exteriorized and cleared of all clot and debris. The hysterotomy was closed with running sutures of  0 vicryl. A second imbricating layer was placed with the same suture. Excellent hemostasis was observed.   Attention was then turned to the tubal ligation. The right fallopian tube distinguished from the round ligament by identifying the fimbria and was grasped with a Babcock clamp in the midisthmic portion approximately 3 cm from the cornual region. It was then doubly ligated with 0-plain gut suture in a Parkland fashion. The tubal segment was excised with Metzenbaum scissors. Tubal ostea noted. A separate tied around the fimbria allowed [place] to remove them. The procedure was repeated on the left side; however, the fimbriae were left in place because of the surrounding interfering blood vessels. *Care was noted to examine both tubal sites in situ to ensure the sutures were intact and no bleeding was noted. The uterus was returned to the abdomen.  The pelvis was irrigated and again, excellent hemostasis was noted. The fascia was then reapproximated with running sutures of 0 Vicryl.  The subcutaneous tissue was reapproximated with running sutures of vicryl. The skin was reapproximated with Ensorb, and 0.5% bupivacaine infused along the fascial and skin lines.  Instrument, sponge, and needle counts were correct prior to the abdominal closure and at the conclusion of the case.   The patient tolerated the procedure well and was transferred to the recovery room in stable  condition.   Christeen Douglas, MD11/23/2023

## 2022-11-14 NOTE — Progress Notes (Signed)
Postpartum Day  1  Subjective: no complaints, up ad lib, and tolerating PO  Doing well, no concerns. Ambulating without difficulty, pain managed with PO meds, tolerating regular diet, and voiding without difficulty.   No fever/chills, chest pain, shortness of breath, nausea/vomiting, or leg pain. No nipple or breast pain. No headache, visual changes, or RUQ/epigastric pain.  Objective: BP 113/77 (BP Location: Right Arm)   Pulse 71   Temp 97.9 F (36.6 C) (Oral)   Resp 18   Ht 5' (1.524 m)   Wt 76.7 kg   SpO2 99%   Breastfeeding Unknown   BMI 33.01 kg/m    Physical Exam:  General: alert, cooperative, and appears stated age Breasts: soft/nontender CV: RRR Pulm: nl effort, CTABL Abdomen: soft, non-tender, active bowel sounds Uterine Fundus: firm Incision: no significant drainage, no dehiscence, no significant erythema Perineum: minimal edema, intact Lochia: appropriate DVT Evaluation: No evidence of DVT seen on physical exam. Negative Homan's sign. No cords or calf tenderness. No significant calf/ankle edema.  Recent Labs    11/13/22 1536 11/14/22 0539  HGB 11.0* 10.1*  HCT 33.0* 30.6*  WBC 9.4 13.3*  PLT 340 298    Assessment/Plan: 31 y.o. G3P3003 postpartum day # 1  -Continue routine postpartum care -Lactation consult PRN for breastfeeding Encouraged snug fitting bra, cold application, Tylenol PRN, and cabbage leaves for engorgement for formula feeding  -S/P BTL  -Acute blood loss anemia - hemodynamically stable and asymptomatic; start PO ferrous sulfate BID with stool softeners  -Start Paxil 10 mg daily -D/C urinary catheter -Immunization status:   all immunizations up to date   Disposition: Continue inpatient postpartum care    LOS: 1 day   Megan Kramer [name] Delmar Landau, CNM 11/14/2022, 10:29 AM   ----- Chari Manning Certified Nurse Midwife [facility]

## 2022-11-14 NOTE — Discharge Instructions (Signed)
Cesarean Delivery, Care After Refer to this sheet in the next few weeks. These instructions provide you with information on caring for yourself after your procedure. Your health care provider may also give you specific instructions. Your treatment has been planned according to current medical practices, but problems sometimes occur. Call your health care provider if you have any problems or questions after you go home. HOME CARE INSTRUCTIONS  Please leave honey comb dressing (OP Site) on for 7 days.  You may shower during this period but turn your back to the water so that the dressing does not get directly saturated by the water.   You may take the dressing off on day 7.  The easiest way to do it is in the shower.  Allow the water to run over the dressing and it usually comes off easier.   Only take over-the-counter or prescription medications as directed by your health care provider. Do not drink alcohol, especially if you are breastfeeding or taking medication to relieve pain. Do not  smoke tobacco. Continue to use good perineal care. Good perineal care includes: Wiping your perineum from front to back. Keeping your perineum clean. Check your surgical cut (incision) daily for increased redness, drainage, swelling, or separation of skin. Shower and clean your incision gently with soap and water every day, by letting warm and soapy water run over the incision, and then pat it dry. If your health care provider says it is okay, leave the incision uncovered. Use a bandage (dressing) if the incision is draining fluid or appears irritated. If the adhesive strips across the incision do not fall off within 7 days, carefully peel them off, after a shower. Hug a pillow when coughing or sneezing until your incision is healed. This helps to relieve pain. Do not use tampons, douches or have sexual intercourse, until your health care provider says it is okay. Wear a well-fitting bra that provides breast  support. Limit wearing support panties or control-top hose. Drink enough fluids to keep your urine clear or pale yellow. Eat high-fiber foods such as whole grain cereals and breads, brown rice, beans, and fresh fruits and vegetables every day. These foods may help prevent or relieve constipation. Resume activities such as climbing stairs, driving, lifting, exercising, or traveling as directed by your health care provider. Try to have someone help you with your household activities and your newborn for at least a few days after you leave the hospital. Rest as much as possible. Try to rest or take a nap when your newborn is sleeping. Increase your activities gradually. Do not lift more than 15lbs until directed by a provider. Keep all of your scheduled postpartum appointments. It is very important to keep your scheduled follow-up appointments. At these appointments, your health care provider will be checking to make sure that you are healing physically and emotionally. SEEK MEDICAL CARE IF:  You are passing large clots from your vagina. Save any clots to show your health care provider. You have a foul smelling discharge from your vagina. You have trouble urinating. You are urinating frequently. You have pain when you urinate. You have a change in your bowel movements. You have increasing redness, pain, or swelling near your incision. You have pus draining from your incision. Your incision is separating. You have painful, hard, or reddened breasts. You have a severe headache. You have blurred vision or see spots. You feel sad or depressed. You have thoughts of hurting yourself or your newborn. You have questions   about your care, the care of your newborn, or medications. You are dizzy or light-headed. You have a rash. You have pain, redness, or swelling at the site of the removed intravenous access (IV) tube. You have nausea or vomiting. You stopped breastfeeding and have not had a  menstrual period within 12 weeks of stopping. You are not breastfeeding and have not had a menstrual period within 12 weeks of delivery. You have a fever. SEEK IMMEDIATE MEDICAL CARE IF: You have persistent pain. You have chest pain. You have shortness of breath. You faint. You have leg pain. You have stomach pain. Your vaginal bleeding saturates 2 or more sanitary pads in 1 hour. MAKE SURE YOU:  Understand these instructions. Will watch your condition. Will get help right away if you are not doing well or get worse. Document Released: 08/31/2002 Document Revised: 04/25/2014 Document Reviewed: 08/05/2012 ExitCare Patient Information 2015 [employer]. This information is not intended to replace advice given to you by your health care provider. Make sure you discuss any questions you have with your health care provider.  

## 2022-11-15 ENCOUNTER — Encounter: Payer: Self-pay | Admitting: Obstetrics and Gynecology

## 2022-11-15 MED ORDER — OXYCODONE HCL 5 MG PO TABS: 5.0000 mg | ORAL_TABLET | ORAL | 0 refills | Status: DC | PRN

## 2022-11-15 MED ORDER — ACETAMINOPHEN 500 MG PO TABS: 1000.0000 mg | ORAL_TABLET | Freq: Four times a day (QID) | ORAL | 0 refills | Status: DC

## 2022-11-15 MED ORDER — SIMETHICONE 80 MG PO CHEW: 80.0000 mg | CHEWABLE_TABLET | ORAL | 0 refills | Status: DC | PRN

## 2022-11-15 MED ORDER — COCONUT OIL OIL: 1.0000 | TOPICAL_OIL | 0 refills | Status: DC | PRN

## 2022-11-15 MED ORDER — PAROXETINE HCL 10 MG PO TABS: 10.0000 mg | ORAL_TABLET | Freq: Every day | ORAL | 0 refills | Status: DC

## 2022-11-15 MED ORDER — FERROUS SULFATE 325 (65 FE) MG PO TABS: 325.0000 mg | ORAL_TABLET | Freq: Two times a day (BID) | ORAL | 3 refills | Status: DC

## 2022-11-15 MED ORDER — DIBUCAINE (PERIANAL) 1 % EX OINT: 1.0000 | TOPICAL_OINTMENT | CUTANEOUS | Status: DC | PRN

## 2022-11-15 MED ORDER — SENNOSIDES-DOCUSATE SODIUM 8.6-50 MG PO TABS: 2.0000 | ORAL_TABLET | ORAL | Status: DC

## 2022-11-15 MED ORDER — IBUPROFEN 600 MG PO TABS: 600.0000 mg | ORAL_TABLET | Freq: Four times a day (QID) | ORAL | 0 refills | Status: DC

## 2022-11-15 MED ORDER — WITCH HAZEL-GLYCERIN EX PADS: 1.0000 | MEDICATED_PAD | CUTANEOUS | 12 refills | Status: DC | PRN

## 2022-11-15 NOTE — Anesthesia Post-op Follow-up Note (Signed)
  Anesthesia Pain Follow-up Note  Patient: Megan Kramer  Day #: 2  Date of Follow-up: 11/15/2022 Time: 8:03 AM  Last Vitals:  Vitals:   11/15/22 0311 11/15/22 0340  BP: (!) 139/92 127/78  Pulse: 80 84  Resp: 20   Temp: 36.7 C   SpO2: 99% 100%    Level of Consciousness: alert  Pain: none   Side Effects:None  Catheter Site Exam:clean, dry     Plan: D/C from anesthesia care at surgeon's request  [facility]

## 2022-11-15 NOTE — Progress Notes (Signed)
Pt declined taking shower tonight and removing pressure dressing. Pt states she will shower tomorrow morning.

## 2022-11-15 NOTE — Anesthesia Postprocedure Evaluation (Signed)
Anesthesia Post Note  Patient: Megan Kramer  Procedure(s) Performed: REPEAT CESAREAN SECTION WITH BILATERAL TUBAL LIGATION (Bilateral)  Patient location during evaluation: Mother Baby Anesthesia Type: Spinal Level of consciousness: awake, awake and alert and oriented Pain management: pain level controlled Vital Signs Assessment: post-procedure vital signs reviewed and stable Respiratory status: spontaneous breathing, nonlabored ventilation and respiratory function stable Cardiovascular status: blood pressure returned to baseline and stable Postop Assessment: no headache and no backache Anesthetic complications: no  No notable events documented.   Last Vitals:  Vitals:   11/15/22 0311 11/15/22 0340  BP: (!) 139/92 127/78  Pulse: 80 84  Resp: 20   Temp: 36.7 C   SpO2: 99% 100%    Last Pain:  Vitals:   11/15/22 0311  TempSrc: Oral  PainSc:                  Ginger Carne

## 2022-11-18 ENCOUNTER — Inpatient Hospital Stay: Admission: RE | Admit: 2022-11-18 | Payer: 59 | Source: Ambulatory Visit

## 2022-11-18 LAB — SURGICAL PATHOLOGY

## 2022-11-20 NOTE — Anesthesia Preprocedure Evaluation (Signed)
Anesthesia Evaluation  Patient identified by MRN, date of birth, ID band Patient awake    Reviewed: Allergy & Precautions, H&P , NPO status , Patient's Chart, lab work & pertinent test results, reviewed documented beta blocker date and time   Airway Mallampati: II  TM Distance: >3 FB Neck ROM: full    Dental no notable dental hx. (+) Teeth Intact   Pulmonary asthma    Pulmonary exam normal breath sounds clear to auscultation       Cardiovascular Exercise Tolerance: Good hypertension, On Medications + dysrhythmias + Valvular Problems/Murmurs  Rhythm:regular Rate:Normal     Neuro/Psych   Anxiety Depression    negative neurological ROS  negative psych ROS   GI/Hepatic Neg liver ROS,GERD  Medicated,,  Endo/Other  diabetes, Well Controlled  Morbid obesity  Renal/GU Renal disease  negative genitourinary   Musculoskeletal   Abdominal   Peds  Hematology negative hematology ROS (+)   Anesthesia Other Findings   Reproductive/Obstetrics (+) Pregnancy                             Anesthesia Physical Anesthesia Plan  ASA: 3 and emergent  Anesthesia Plan: Spinal   Post-op Pain Management:    Induction:   PONV Risk Score and Plan:   Airway Management Planned:   Additional Equipment:   Intra-op Plan:   Post-operative Plan:   Informed Consent: I have reviewed the patients History and Physical, chart, labs and discussed the procedure including the risks, benefits and alternatives for the proposed anesthesia with the patient or authorized representative who has indicated his/her understanding and acceptance.       Plan Discussed with:   Anesthesia Plan Comments:        Anesthesia Quick Evaluation

## 2022-11-21 ENCOUNTER — Inpatient Hospital Stay: Admission: RE | Admit: 2022-11-21 | Payer: 59 | Source: Home / Self Care | Admitting: Obstetrics and Gynecology

## 2022-11-21 DIAGNOSIS — Z98891 History of uterine scar from previous surgery: Secondary | ICD-10-CM

## 2022-11-25 ENCOUNTER — Encounter: Payer: Self-pay | Admitting: Obstetrics and Gynecology

## 2023-04-09 ENCOUNTER — Other Ambulatory Visit: Payer: Self-pay | Admitting: Nurse Practitioner

## 2023-04-09 ENCOUNTER — Other Ambulatory Visit: Payer: Self-pay

## 2023-04-09 ENCOUNTER — Ambulatory Visit: Payer: PRIVATE HEALTH INSURANCE | Admitting: Family

## 2023-04-09 DIAGNOSIS — N3000 Acute cystitis without hematuria: Secondary | ICD-10-CM | POA: Diagnosis not present

## 2023-04-09 DIAGNOSIS — N390 Urinary tract infection, site not specified: Secondary | ICD-10-CM

## 2023-04-09 LAB — POCT URINALYSIS DIPSTICK
Bilirubin, UA: 4
Blood, UA: NEGATIVE
Glucose, UA: POSITIVE — AB
Ketones, UA: 15
Nitrite, UA: POSITIVE
Protein, UA: POSITIVE — AB
Spec Grav, UA: 1.02 (ref 1.010–1.025)
Urobilinogen, UA: >=8 E.U./dL — AB
pH, UA: 5 (ref 5.0–8.0)

## 2023-04-09 MED ORDER — CIPROFLOXACIN HCL 500 MG PO TABS: 500.0000 mg | ORAL_TABLET | Freq: Two times a day (BID) | ORAL | 0 refills | Status: DC

## 2023-04-09 MED ORDER — CIPROFLOXACIN HCL 500 MG PO TABS: 500.0000 mg | ORAL_TABLET | Freq: Two times a day (BID) | ORAL | 0 refills | Status: AC

## 2023-05-02 ENCOUNTER — Ambulatory Visit (INDEPENDENT_AMBULATORY_CARE_PROVIDER_SITE_OTHER): Payer: PRIVATE HEALTH INSURANCE | Admitting: Nurse Practitioner

## 2023-05-02 VITALS — BP 114/66 | HR 54 | Ht 60.0 in | Wt 151.0 lb

## 2023-05-02 DIAGNOSIS — F419 Anxiety disorder, unspecified: Secondary | ICD-10-CM | POA: Diagnosis not present

## 2023-05-02 DIAGNOSIS — R5383 Other fatigue: Secondary | ICD-10-CM

## 2023-05-02 DIAGNOSIS — L600 Ingrowing nail: Secondary | ICD-10-CM

## 2023-05-02 DIAGNOSIS — E785 Hyperlipidemia, unspecified: Secondary | ICD-10-CM | POA: Diagnosis not present

## 2023-05-02 DIAGNOSIS — L309 Dermatitis, unspecified: Secondary | ICD-10-CM

## 2023-05-02 DIAGNOSIS — F32A Depression, unspecified: Secondary | ICD-10-CM

## 2023-05-02 DIAGNOSIS — R7301 Impaired fasting glucose: Secondary | ICD-10-CM

## 2023-05-02 MED ORDER — CEPHALEXIN 250 MG PO CAPS: 250.0000 mg | ORAL_CAPSULE | Freq: Four times a day (QID) | ORAL | 0 refills | Status: DC

## 2023-05-02 MED ORDER — TRIAMCINOLONE ACETONIDE 0.1 % EX CREA: 1.0000 | TOPICAL_CREAM | Freq: Two times a day (BID) | CUTANEOUS | 0 refills | Status: DC

## 2023-05-02 MED ORDER — PAROXETINE HCL 10 MG PO TABS: 10.0000 mg | ORAL_TABLET | Freq: Every day | ORAL | 1 refills | Status: DC

## 2023-05-02 NOTE — Patient Instructions (Signed)
1) Warm Epsom salt water soaks nightly for ingrown 2) Cephalexin 3) TAC for eczema 4) Paroxetine anxiety 5) Fasting labs

## 2023-05-02 NOTE — Progress Notes (Signed)
Established Patient Office Visit  Subjective:  Patient ID: Megan Kramer, female    DOB: 1991-06-29  Age: 32 y.o. MRN: [id number]  Chief Complaint  Patient presents with   Follow-up    Check Up    Follow up and re check BP.  Check BP at home.  Hx of anxiety.  Paroxetine worked well.      No other concerns at this time.   Past Medical History:  Diagnosis Date   Anxiety    Asthma    EXERCISE INDUCED-NO INHALER NOW   Chronic cholecystitis without calculus    Depression    Dysrhythmia    TACHYCARDIA WITH 1ST PREGNANCY-ECHO DONE AND WAS WNL-ASYMPTOMATIC NOW   Family history of adverse reaction to anesthesia    MOM- N/V   Genital herpes    GERD (gastroesophageal reflux disease)    H/O WITH PREGNANCY   Heart murmur    AS A CHILD-ASYMPTOMATIC   History of kidney stones    Hyperlipidemia    Hypertension    pregnancy induced   IBS (irritable bowel syndrome)    Kidney stones 05/09/2017   Obesity (BMI 30-39.9)    Postpartum depression associated with first pregnancy     Past Surgical History:  Procedure Laterality Date   CESAREAN SECTION  12/21/2014   Teresa [place], [place]   CESAREAN SECTION N/A 05/27/2017   Procedure: CESAREAN SECTION;  Surgeon: Nadara Mustard, MD;  Location: [facility];  Service: Obstetrics;  Laterality: N/A;   CESAREAN SECTION WITH BILATERAL TUBAL LIGATION Bilateral 11/13/2022   Procedure: REPEAT CESAREAN SECTION WITH BILATERAL TUBAL LIGATION;  Surgeon: Christeen Douglas, MD;  Location: [facility];  Service: Obstetrics;  Laterality: Bilateral;   CHOLECYSTECTOMY N/A 05/15/2018   Procedure: LAPAROSCOPIC CHOLECYSTECTOMY WITH INTRAOPERATIVE CHOLANGIOGRAM;  Surgeon: Earline Mayotte, MD;  Location: [facility];  Service: General;  Laterality: N/A;   CYSTOSCOPY/URETEROSCOPY/HOLMIUM LASER/STENT PLACEMENT Right 02/27/2018   Procedure: CYSTOSCOPY/URETEROSCOPY/HOLMIUM LASER/STENT PLACEMENT;  Surgeon: Riki Altes, MD;  Location: [facility];  Service: Urology;  Laterality:  Right;   OTHER SURGICAL HISTORY     mole bx    WISDOM TOOTH EXTRACTION      Social History   Socioeconomic History   Marital status: Significant Other    Spouse name: Not on file   Number of children: 2   Years of education: 27   Highest education level: Not on file  Occupational History   Occupation: Sales executive  Tobacco Use   Smoking status: Never   Smokeless tobacco: Never  Vaping Use   Vaping Use: Never used  Substance and Sexual Activity   Alcohol use: Never    Comment: occasion   Drug use: No   Sexual activity: Yes    Partners: Male    Birth control/protection: Surgical    Comment: BTL  Other Topics Concern   Not on file  Social History Narrative   Married    2 kids    Sales executive    Social Determinants of Health   Financial Resource Strain: Not on file  Food Insecurity: Food Insecurity Present (11/13/2022)   Hunger Vital Sign    Worried About Running Out of Food in the Last Year: Sometimes true    Ran Out of Food in the Last Year: Never true  Transportation Needs: No Transportation Needs (11/13/2022)   PRAPARE - Administrator, Civil Service (Medical): No    Lack of Transportation (Non-Medical): No  Physical Activity: Not on file  Stress:  Not on file  Social Connections: Not on file  Intimate Partner Violence: Not At Risk (11/13/2022)   Humiliation, Afraid, Rape, and Kick questionnaire    Fear of Current or Ex-Partner: No    Emotionally Abused: No    Physically Abused: No    Sexually Abused: No    Family History  Problem Relation Age of Onset   Hypertension Mother    Depression Mother    Hyperlipidemia Mother    Diverticulosis Mother    Hearing loss Father    Hypertension Father    Irritable bowel syndrome Father    Hyperlipidemia Father    Breast cancer Maternal Grandmother    Miscarriages / Stillbirths Maternal Grandmother    Diabetes Maternal Grandfather    Heart disease Maternal Grandfather        MI   Arthritis  Paternal Grandfather    Irritable bowel syndrome Paternal Aunt    GER disease Paternal Aunt    Crohn's disease Paternal Aunt    Diabetes Other     Allergies  Allergen Reactions   Codeine Nausea Only and Nausea And Vomiting   Shellfish Allergy Hives    hives   Latex Rash    Review of Systems  Constitutional: Negative.   HENT: Negative.    Respiratory: Negative.    Gastrointestinal: Negative.   Genitourinary: Negative.   Musculoskeletal: Negative.   Skin: Negative.   Neurological: Negative.   Endo/Heme/Allergies: Negative.   Psychiatric/Behavioral:  The patient is nervous/anxious.        Objective:   BP 114/66   Pulse (!) 54   Ht 5' (1.524 m)   Wt 151 lb (68.5 kg)   SpO2 99%   BMI 29.49 kg/m   Vitals:   05/02/23 0941  BP: 114/66  Pulse: (!) 54  Height: 5' (1.524 m)  Weight: 151 lb (68.5 kg)  SpO2: 99%  BMI (Calculated): 29.49    Physical Exam Vitals reviewed.  Constitutional:      Appearance: Normal appearance.  HENT:     Head: Normocephalic.     Nose: Nose normal.     Mouth/Throat:     Mouth: Mucous membranes are moist.  Eyes:     Pupils: Pupils are equal, round, and reactive to light.  Cardiovascular:     Rate and Rhythm: Normal rate and regular rhythm.  Pulmonary:     Effort: Pulmonary effort is normal.     Breath sounds: Normal breath sounds.  Abdominal:     General: Bowel sounds are normal.     Palpations: Abdomen is soft.  Musculoskeletal:        General: Normal range of motion.     Cervical back: Normal range of motion and neck supple.  Skin:    Findings: Rash present.  Neurological:     Mental Status: She is alert and oriented to person, place, and time.  Psychiatric:        Mood and Affect: Mood normal.        Behavior: Behavior normal.      No results found for any visits on 05/02/23.  Recent Results (from the past 2160 hour(s))  POCT Urinalysis Dipstick ([address])     Status: Abnormal   Collection Time: 04/09/23 10:06 AM   Result Value Ref Range   Color, UA orange    Clarity, UA turbid    Glucose, UA Positive (A) Negative   Bilirubin, UA 4    Ketones, UA 15    Spec Grav, UA 1.020 1.010 -  1.025   Blood, UA negative    pH, UA 5.0 5.0 - 8.0   Protein, UA Positive (A) Negative   Urobilinogen, UA >=8.0 (A) 0.2 or 1.0 E.U./dL   Nitrite, UA positive    Leukocytes, UA Large (3+) (A) Negative   Appearance orange    Odor none       Assessment & Plan:   Problem List Items Addressed This Visit   None   No follow-ups on file.   Total time spent: 35 minutes  Orson Eva, NP  05/02/2023

## 2023-05-03 LAB — CMP14+EGFR
ALT: 10 IU/L (ref 0–32)
AST: 16 IU/L (ref 0–40)
Albumin/Globulin Ratio: 1.9 (ref 1.2–2.2)
Albumin: 4.7 g/dL (ref 3.9–4.9)
Alkaline Phosphatase: 141 IU/L — ABNORMAL HIGH (ref 44–121)
BUN/Creatinine Ratio: 14 (ref 9–23)
BUN: 9 mg/dL (ref 6–20)
Bilirubin Total: 0.6 mg/dL (ref 0.0–1.2)
CO2: 23 mmol/L (ref 20–29)
Calcium: 9.5 mg/dL (ref 8.7–10.2)
Chloride: 99 mmol/L (ref 96–106)
Creatinine, Ser: 0.66 mg/dL (ref 0.57–1.00)
Globulin, Total: 2.5 g/dL (ref 1.5–4.5)
Glucose: 85 mg/dL (ref 70–99)
Potassium: 4.6 mmol/L (ref 3.5–5.2)
Sodium: 136 mmol/L (ref 134–144)
Total Protein: 7.2 g/dL (ref 6.0–8.5)
eGFR: 118 mL/min/{1.73_m2} (ref >59)

## 2023-05-03 LAB — LIPID PANEL
Chol/HDL Ratio: 2.4 ratio (ref 0.0–4.4)
Cholesterol, Total: 163 mg/dL (ref 100–199)
HDL: 68 mg/dL (ref >39)
LDL Chol Calc (NIH): 84 mg/dL (ref 0–99)
Triglycerides: 55 mg/dL (ref 0–149)
VLDL Cholesterol Cal: 11 mg/dL (ref 5–40)

## 2023-05-03 LAB — HEMOGLOBIN A1C
Est. average glucose Bld gHb Est-mCnc: 97 mg/dL
Hgb A1c MFr Bld: 5 % (ref 4.8–5.6)

## 2023-05-03 LAB — TSH: TSH: 1.07 u[IU]/mL (ref 0.450–4.500)

## 2023-05-08 ENCOUNTER — Encounter: Payer: Self-pay | Admitting: Nurse Practitioner

## 2023-06-29 ENCOUNTER — Emergency Department (HOSPITAL_COMMUNITY)
Admission: EM | Admit: 2023-06-29 | Discharge: 2023-06-29 | Disposition: A | Discharge status: Home / Self Care | Payer: Medicaid Other | Attending: Emergency Medicine | Admitting: Emergency Medicine

## 2023-06-29 ENCOUNTER — Encounter (HOSPITAL_COMMUNITY): Payer: Self-pay | Admitting: *Deleted

## 2023-06-29 ENCOUNTER — Other Ambulatory Visit: Payer: Self-pay

## 2023-06-29 ENCOUNTER — Emergency Department (HOSPITAL_COMMUNITY): Payer: Medicaid Other

## 2023-06-29 DIAGNOSIS — N3001 Acute cystitis with hematuria: Secondary | ICD-10-CM | POA: Insufficient documentation

## 2023-06-29 DIAGNOSIS — Z91040 Latex allergy status: Secondary | ICD-10-CM | POA: Insufficient documentation

## 2023-06-29 DIAGNOSIS — R1030 Lower abdominal pain, unspecified: Secondary | ICD-10-CM | POA: Diagnosis present

## 2023-06-29 DIAGNOSIS — D72829 Elevated white blood cell count, unspecified: Secondary | ICD-10-CM | POA: Insufficient documentation

## 2023-06-29 DIAGNOSIS — Z87442 Personal history of urinary calculi: Secondary | ICD-10-CM | POA: Diagnosis not present

## 2023-06-29 LAB — COMPREHENSIVE METABOLIC PANEL
ALT: 18 U/L (ref 0–44)
AST: 22 U/L (ref 15–41)
Albumin: 3.8 g/dL (ref 3.5–5.0)
Alkaline Phosphatase: 121 U/L (ref 38–126)
Anion gap: 8 (ref 5–15)
BUN: 9 mg/dL (ref 6–20)
CO2: 25 mmol/L (ref 22–32)
Calcium: 8.3 mg/dL — ABNORMAL LOW (ref 8.9–10.3)
Chloride: 100 mmol/L (ref 98–111)
Creatinine, Ser: 0.59 mg/dL (ref 0.44–1.00)
GFR, Estimated: >60 mL/min (ref >60)
Glucose, Bld: 93 mg/dL (ref 70–99)
Potassium: 3.9 mmol/L (ref 3.5–5.1)
Sodium: 133 mmol/L — ABNORMAL LOW (ref 135–145)
Total Bilirubin: 0.2 mg/dL — ABNORMAL LOW (ref 0.3–1.2)
Total Protein: 7.3 g/dL (ref 6.5–8.1)

## 2023-06-29 LAB — URINALYSIS, ROUTINE W REFLEX MICROSCOPIC
Bilirubin Urine: NEGATIVE
Glucose, UA: NEGATIVE mg/dL
Ketones, ur: NEGATIVE mg/dL
Nitrite: POSITIVE — AB
Protein, ur: 100 mg/dL — AB
RBC / HPF: >50 RBC/hpf (ref 0–5)
Specific Gravity, Urine: 1.016 (ref 1.005–1.030)
WBC, UA: >50 WBC/hpf (ref 0–5)
pH: 6 (ref 5.0–8.0)

## 2023-06-29 LAB — CBC
HCT: 38.6 % (ref 36.0–46.0)
Hemoglobin: 12.6 g/dL (ref 12.0–15.0)
MCH: 29.3 pg (ref 26.0–34.0)
MCHC: 32.6 g/dL (ref 30.0–36.0)
MCV: 89.8 fL (ref 80.0–100.0)
Platelets: 349 10*3/uL (ref 150–400)
RBC: 4.3 MIL/uL (ref 3.87–5.11)
RDW: 13.1 % (ref 11.5–15.5)
WBC: 14.9 10*3/uL — ABNORMAL HIGH (ref 4.0–10.5)
nRBC: 0 % (ref 0.0–0.2)

## 2023-06-29 LAB — PREGNANCY, URINE: Preg Test, Ur: NEGATIVE

## 2023-06-29 MED ORDER — SULFAMETHOXAZOLE-TRIMETHOPRIM 800-160 MG PO TABS: 1.0000 | ORAL_TABLET | Freq: Two times a day (BID) | ORAL | 0 refills | Status: AC

## 2023-06-29 MED ORDER — ONDANSETRON HCL 4 MG/2ML IJ SOLN
4.0000 mg | Freq: Once | INTRAMUSCULAR | Status: AC
  Administered 2023-06-29: 4 mg via INTRAVENOUS
  Filled 2023-06-29: qty 2

## 2023-06-29 MED ORDER — KETOROLAC TROMETHAMINE 30 MG/ML IJ SOLN
30.0000 mg | Freq: Once | INTRAMUSCULAR | Status: AC
  Administered 2023-06-29: 30 mg via INTRAVENOUS
  Filled 2023-06-29: qty 1

## 2023-06-29 MED ORDER — ONDANSETRON 4 MG PO TBDP: 4.0000 mg | ORAL_TABLET | Freq: Three times a day (TID) | ORAL | 0 refills | Status: DC | PRN

## 2023-06-29 MED ORDER — MELOXICAM 15 MG PO TABS: 15.0000 mg | ORAL_TABLET | Freq: Every day | ORAL | 0 refills | Status: AC

## 2023-06-29 MED ORDER — SULFAMETHOXAZOLE-TRIMETHOPRIM 800-160 MG PO TABS
1.0000 | ORAL_TABLET | Freq: Once | ORAL | Status: AC
  Administered 2023-06-29: 1 via ORAL
  Filled 2023-06-29: qty 1

## 2023-06-29 NOTE — Discharge Instructions (Signed)
Your testing definitely shows that there is urinary tract infection, I do want you to take the medication called Bactrim twice a day for the next week.  If you are developing worsening pain, vomiting, fever or any worsening symptoms return to the emergency department immediately.  I have provided a medication called Mobic which can be taken once a day for pain and a medicine called Zofran which can be taken every 6 hours for nausea.  Thank you for allowing [place] to treat you in the emergency department today.  After reviewing your examination and potential testing that was done it appears that you are safe to go home.  I would like for you to follow-up with your doctor within the next several days, have them obtain your results and follow-up with them to review all of these tests.  If you should develop severe or worsening symptoms return to the emergency department immediately

## 2023-06-29 NOTE — ED Notes (Signed)
Introduced self to pt Bladder pain and spasms started today  Urine frequency started yesterday  "Disgusting smelling pee" started yesterday  Period normals, had tubal 7 months ago  Denies fevers  Attached to partial monitor  Vitals listed

## 2023-06-29 NOTE — ED Provider Notes (Signed)
[place] EMERGENCY DEPARTMENT AT [facility] Provider Note   CSN: [id number] Arrival date & time: 06/29/23  1538     History  Chief Complaint  Patient presents with   Abdominal Pain    Megan Kramer is a 32 y.o. female.   Abdominal Pain  This patient is a 32 year old female with a history of renal stones, she presents to the hospital with a complaint of right-sided flank and lower abdominal pain that started yesterday with some bladder spasms, she took some AZO while she was on her way back from her family vacation to the mountains of [place].  Today she started to have some urinary symptoms including some frequency and some discomfort as well as right-sided flank pain radiating to the groin.  She is nauseated and vomited twice, this comes on when the pain gets worse.  It is currently about 4 out of 10.  No fevers or chills  Review of the medical record shows that the patient has had a couple of CT scans confirming nephrolithiasis in the past, most recently was around 2019, it was an 8 mm distal ureteral stone that required urology intervention    Home Medications Prior to Admission medications   Medication Sig Start Date End Date Taking? Authorizing Provider  meloxicam (MOBIC) 15 MG tablet Take 1 tablet (15 mg total) by mouth daily for 14 days. 06/29/23 07/13/23 Yes Eber Hong, MD  ondansetron (ZOFRAN-ODT) 4 MG disintegrating tablet Take 1 tablet (4 mg total) by mouth every 8 (eight) hours as needed for nausea. 06/29/23  Yes Eber Hong, MD  sulfamethoxazole-trimethoprim (BACTRIM DS) 800-160 MG tablet Take 1 tablet by mouth 2 (two) times daily for 7 days. 06/29/23 07/06/23 Yes Eber Hong, MD  PARoxetine (PAXIL) 10 MG tablet Take 1 tablet (10 mg total) by mouth daily. 05/02/23   Orson Eva, NP      Allergies    Codeine, Shellfish allergy, and Latex    Review of Systems   Review of Systems  Gastrointestinal:  Positive for abdominal pain.    Physical  Exam Updated Vital Signs BP (!) 144/72 (BP Location: Right Arm)   Pulse 80   Temp 98.9 F (37.2 C) (Oral)   Resp 14   Ht 1.524 m (5')   Wt 71.7 kg   LMP 06/15/2023 (Approximate)   SpO2 100%   BMI 30.86 kg/m  Physical Exam Vitals and nursing note reviewed.  Constitutional:      General: She is not in acute distress.    Appearance: She is well-developed.  HENT:     Head: Normocephalic and atraumatic.     Mouth/Throat:     Pharynx: No oropharyngeal exudate.  Eyes:     General: No scleral icterus.       Right eye: No discharge.        Left eye: No discharge.     Conjunctiva/sclera: Conjunctivae normal.     Pupils: Pupils are equal, round, and reactive to light.  Neck:     Thyroid: No thyromegaly.     Vascular: No JVD.  Cardiovascular:     Rate and Rhythm: Normal rate and regular rhythm.     Heart sounds: Normal heart sounds. No murmur heard.    No friction rub. No gallop.  Pulmonary:     Effort: Pulmonary effort is normal. No respiratory distress.     Breath sounds: Normal breath sounds. No wheezing or rales.  Abdominal:     General: Bowel sounds are normal. There  is no distension.     Palpations: Abdomen is soft. There is no mass.     Tenderness: There is no abdominal tenderness.  Musculoskeletal:        General: No tenderness. Normal range of motion.     Cervical back: Normal range of motion and neck supple.  Lymphadenopathy:     Cervical: No cervical adenopathy.  Skin:    General: Skin is warm and dry.     Findings: No erythema or rash.  Neurological:     Mental Status: She is alert.     Coordination: Coordination normal.  Psychiatric:        Behavior: Behavior normal.     ED Results / Procedures / Treatments   Labs (all labs ordered are listed, but only abnormal results are displayed) Labs Reviewed  COMPREHENSIVE METABOLIC PANEL - Abnormal; Notable for the following components:      Result Value   Sodium 133 (*)    Calcium 8.3 (*)    Total Bilirubin  0.2 (*)    All other components within normal limits  CBC - Abnormal; Notable for the following components:   WBC 14.9 (*)    All other components within normal limits  URINALYSIS, ROUTINE W REFLEX MICROSCOPIC - Abnormal; Notable for the following components:   Color, Urine AMBER (*)    APPearance CLOUDY (*)    Hgb urine dipstick MODERATE (*)    Protein, ur 100 (*)    Nitrite POSITIVE (*)    Leukocytes,Ua MODERATE (*)    Bacteria, UA FEW (*)    All other components within normal limits  URINE CULTURE  PREGNANCY, URINE    EKG None  Radiology CT Renal Stone Study  Result Date: 06/29/2023 CLINICAL DATA:  Abdominal pain and back pain today.  Vomiting x1. EXAM: CT ABDOMEN AND PELVIS WITHOUT CONTRAST TECHNIQUE: Multidetector CT imaging of the abdomen and pelvis was performed following the standard protocol without IV contrast. RADIATION DOSE REDUCTION: This exam was performed according to the departmental dose-optimization program which includes automated exposure control, adjustment of the mA and/or kV according to patient size and/or use of iterative reconstruction technique. COMPARISON:  01/16/2018. FINDINGS: Lower chest: Clear lung bases. Hepatobiliary: No focal liver abnormality is seen. Status post cholecystectomy. No biliary dilatation. Pancreas: Unremarkable. No pancreatic ductal dilatation or surrounding inflammatory changes. Spleen: Normal in size without focal abnormality. Adrenals/Urinary Tract: Normal adrenal glands. Kidneys are normal in size, orientation and position. Mild prominence of the right intrarenal collecting system. Moderate distension of the right ureter. No ureteral stone. Tiny nonobstructing stone in the lower pole the right kidney. No left intrarenal stone. No renal masses. No left hydronephrosis. Normal left ureter. Bladder is unremarkable. Stomach/Bowel: Normal stomach. Small bowel and colon are normal in caliber. No wall thickening. No inflammation. No evidence of  appendicitis. Vascular/Lymphatic: No significant vascular findings are present. No enlarged abdominal or pelvic lymph nodes. Reproductive: Uterus and bilateral adnexa are unremarkable. Other: C-section scar, stable.  No hernia.  No ascites. Musculoskeletal: Mild thoracolumbar curvature. Otherwise unremarkable. IMPRESSION: 1. Moderate dilation of the right ureter with mild prominence of the right intrarenal collecting system. No ureteral stone. Findings may reflect the recent passage of a stone or a non radiopaque stone. 2. No other evidence of an acute or recent abnormality. Tiny nonobstructing stone in the lower pole the right kidney. Electronically Signed   By: Amie Portland M.D.   On: 06/29/2023 16:47    Procedures Procedures    Medications Ordered  in ED Medications  sulfamethoxazole-trimethoprim (BACTRIM DS) 800-160 MG per tablet 1 tablet (has no administration in time range)  ketorolac (TORADOL) 30 MG/ML injection 30 mg (30 mg Intravenous Given 06/29/23 1637)  ondansetron (ZOFRAN) injection 4 mg (4 mg Intravenous Given 06/29/23 1636)    ED Course/ Medical Decision Making/ A&P                             Medical Decision Making Problems Addressed: Acute cystitis with hematuria: complicated acute illness or injury  Amount and/or Complexity of Data Reviewed External Data Reviewed: radiology.    Details: Prior CT scan Labs: ordered. Radiology: ordered.  Risk Prescription drug management.    This patient presents to the ED for concern of flank and abdominal pain with urinary symptoms differential diagnosis includes nephrolithiasis, pyelonephritis, cystitis, appendicitis seems much less likely given the lack of tenderness on exam    Additional history obtained:  Additional history obtained from electronic medical record External records from outside source obtained and reviewed including prior CT scan confirming prior nephrolithiasis and ureterolithiasis   Lab Tests:  I  Ordered, and personally interpreted labs.  The pertinent results include: CBC with leukocytosis, urinalysis with nitrite and leukocyte positive, many white blood cells, many red blood cells, bacteria present, preserved renal function   Imaging Studies ordered:  I ordered imaging studies including CT scan of the abdomen and pelvis, renal stone study I independently visualized and interpreted imaging which showed no significant hydronephrosis, hydroureter is present consistent with prior scan, no obvious kidney stone present I agree with the radiologist interpretation   Medicines ordered and prescription drug management:  I ordered medication including antibiotics (Bactrim) for infection, Toradol and Zofran given as well Reevaluation of the patient after these medicines showed that the patient improved I have reviewed the patients home medicines and have made adjustments as needed   Problem List / ED Course:  Patient does get frequent urinary tract infections, had been on cephalexin a couple of months ago, now presents with recurrent UTI symptoms without signs of sepsis, she is not tachycardic or febrile or tachypneic, she does have a leukocytosis as an isolated abnormality   Social Determinants of Health:  None  I have discussed with the patient at the bedside the results, and the meaning of these results.  They have expressed her understanding to the need for follow-up with primary care physician        Final Clinical Impression(s) / ED Diagnoses Final diagnoses:  Acute cystitis with hematuria    Rx / DC Orders ED Discharge Orders          Ordered    meloxicam (MOBIC) 15 MG tablet  Daily        06/29/23 1654    ondansetron (ZOFRAN-ODT) 4 MG disintegrating tablet  Every 8 hours PRN        06/29/23 1654    sulfamethoxazole-trimethoprim (BACTRIM DS) 800-160 MG tablet  2 times daily        06/29/23 1654              Eber Hong, MD 06/29/23 1655

## 2023-06-29 NOTE — ED Triage Notes (Signed)
Pt with abd pain today, back pain now. + bladder spasms.  Noted foul odor to urine yesterday morning.  + emesis x 1. Also with right shoulder blade pain as well.

## 2023-07-01 LAB — URINE CULTURE

## 2023-07-02 LAB — URINE CULTURE: Culture: >=100000 — AB

## 2023-07-03 ENCOUNTER — Telehealth (HOSPITAL_BASED_OUTPATIENT_CLINIC_OR_DEPARTMENT_OTHER): Payer: Self-pay

## 2023-07-03 NOTE — Telephone Encounter (Signed)
Post ED Visit - Positive Culture Follow-up  Culture report reviewed by antimicrobial stewardship pharmacist: [facility] [x]  Francene Finders [place], [place].D. []  Celedonio Miyamoto, Pharm.D., BCPS AQ-ID []  Garvin Fila, Pharm.D., BCPS []  Georgina Pillion, Pharm.D., BCPS []  [place], [address].D., BCPS, AAHIVP []  Estella Husk, Pharm.D., BCPS, AAHIVP []  Lysle Pearl, PharmD, BCPS []  Phillips Climes, PharmD, BCPS []  Agapito Games, PharmD, BCPS []  Verlan Friends, PharmD []  Mervyn Gay, PharmD, BCPS []  Vinnie Level, PharmD  [facility] []  Len Childs, PharmD []  Greer Pickerel, PharmD []  Adalberto Cole, PharmD []  Perlie Gold, Rph []  Lonell Face) Jean Rosenthal, PharmD []  Earl Many, PharmD []  Junita Push, PharmD []  Dorna Leitz, PharmD []  Terrilee Files, PharmD []  Lynann Beaver, PharmD []  Keturah Barre, PharmD []  Loralee Pacas, PharmD []  Bernadene Person, PharmD   Positive urine culture Treated with Sulfamethoxazole-Trimethoprim, organism sensitive to the same and no further patient follow-up is required at this time.  Megan Kramer 07/03/2023, 9:55 AM

## 2023-07-04 ENCOUNTER — Ambulatory Visit: Payer: BC Managed Care – PPO | Admitting: Nurse Practitioner

## 2023-08-06 NOTE — Progress Notes (Signed)
Megan Kramer  CHIEF COMPLAINT  UA/ only visit fot UTI     REASON FOR VISIT  Possible UTI, UA Visit Only    Recent Results (from the past 2160 hour(s))  Comprehensive metabolic panel     Status: Abnormal   Collection Time: 06/29/23  3:50 PM  Result Value Ref Range   Sodium 133 (L) 135 - 145 mmol/L   Potassium 3.9 3.5 - 5.1 mmol/L   Chloride 100 98 - 111 mmol/L   CO2 25 22 - 32 mmol/L   Glucose, Bld 93 70 - 99 mg/dL    Comment: Glucose reference range applies only to samples taken after fasting for at least 8 hours.   BUN 9 6 - 20 mg/dL   Creatinine, Ser 8.29 0.44 - 1.00 mg/dL   Calcium 8.3 (L) 8.9 - 10.3 mg/dL   Total Protein 7.3 6.5 - 8.1 g/dL   Albumin 3.8 3.5 - 5.0 g/dL   AST 22 15 - 41 U/L   ALT 18 0 - 44 U/L   Alkaline Phosphatase 121 38 - 126 U/L   Total Bilirubin 0.2 (L) 0.3 - 1.2 mg/dL   GFR, Estimated >56 >21 mL/min    Comment: (NOTE) Calculated using the CKD-EPI Creatinine Equation (2021)    Anion gap 8 5 - 15    Comment: Performed at [facility], [address], [place], [place] [address]  CBC     Status: Abnormal   Collection Time: 06/29/23  3:50 PM  Result Value Ref Range   WBC 14.9 (H) 4.0 - 10.5 K/uL   RBC 4.30 3.87 - 5.11 MIL/uL   Hemoglobin 12.6 12.0 - 15.0 g/dL   HCT 78.4 69.6 - 29.5 %   MCV 89.8 80.0 - 100.0 fL   MCH 29.3 26.0 - 34.0 pg   MCHC 32.6 30.0 - 36.0 g/dL   RDW 28.4 13.2 - 44.0 %   Platelets 349 150 - 400 K/uL   nRBC 0.0 0.0 - 0.2 %    Comment: Performed at [facility], [address], [place], [place] [address]  Urinalysis, Routine w reflex microscopic -Urine, Clean Catch     Status: Abnormal   Collection Time: 06/29/23  3:50 PM  Result Value Ref Range   Color, Urine AMBER (A) YELLOW    Comment: BIOCHEMICALS MAY BE AFFECTED BY COLOR   APPearance CLOUDY (A) CLEAR   Specific Gravity, Urine 1.016 1.005 - 1.030   pH 6.0 5.0 - 8.0   Glucose, UA NEGATIVE NEGATIVE mg/dL   Hgb urine dipstick MODERATE (A) NEGATIVE   Bilirubin Urine NEGATIVE NEGATIVE    Ketones, ur NEGATIVE NEGATIVE mg/dL   Protein, ur 536 (A) NEGATIVE mg/dL   Nitrite POSITIVE (A) NEGATIVE   Leukocytes,Ua MODERATE (A) NEGATIVE   RBC / HPF >50 0 - 5 RBC/hpf   WBC, UA >50 0 - 5 WBC/hpf   Bacteria, UA FEW (A) NONE SEEN   Squamous Epithelial / HPF 0-5 0 - 5 /HPF   WBC Clumps PRESENT     Comment: Performed at [facility], [address] [place], [place] [address]  Pregnancy, urine     Status: None   Collection Time: 06/29/23  3:50 PM  Result Value Ref Range   Preg Test, Ur NEGATIVE NEGATIVE    Comment:        THE SENSITIVITY OF THIS METHODOLOGY IS >25 mIU/mL. Performed at [facility], [address] [place], [place] [address]   Urine Culture (for pregnant, neutropenic or urologic patients or  patients with an indwelling urinary catheter)     Status: Abnormal   Collection Time: 06/29/23  3:50 PM   Specimen: Urine, Clean Catch  Result Value Ref Range   Specimen Description      URINE, CLEAN CATCH Performed at [facility], [address] [place], [place] [address]    Special Requests      NONE Performed at [facility], [address], [place], [place] [address]    Culture >=100,000 COLONIES/mL ESCHERICHIA COLI (A)    Report Status 07/02/2023 FINAL    Organism ID, Bacteria ESCHERICHIA COLI (A)       Susceptibility   Escherichia coli - MIC*    AMPICILLIN <=2 SENSITIVE Sensitive     CEFAZOLIN <=4 SENSITIVE Sensitive     CEFEPIME <=0.12 SENSITIVE Sensitive     CEFTRIAXONE <=0.25 SENSITIVE Sensitive     CIPROFLOXACIN <=0.25 SENSITIVE Sensitive     GENTAMICIN <=1 SENSITIVE Sensitive     IMIPENEM <=0.25 SENSITIVE Sensitive     NITROFURANTOIN <=16 SENSITIVE Sensitive     TRIMETH/SULFA <=20 SENSITIVE Sensitive     AMPICILLIN/SULBACTAM <=2 SENSITIVE Sensitive     PIP/TAZO <=4 SENSITIVE Sensitive     * >=100,000 COLONIES/mL ESCHERICHIA COLI    ASSESSMENT & PLAN Diagnoses and all orders for this visit:  Acute cystitis without hematuria -     POCT Urinalysis  Dipstick ([address])   Patient notified.  Total time spent: 10 minutes  Miki Kins, FNP 04/09/2023

## 2024-01-09 ENCOUNTER — Encounter: Payer: Self-pay | Admitting: Cardiology

## 2024-01-09 ENCOUNTER — Ambulatory Visit (INDEPENDENT_AMBULATORY_CARE_PROVIDER_SITE_OTHER): Payer: BC Managed Care – PPO | Admitting: Cardiology

## 2024-01-09 VITALS — BP 110/70 | HR 62 | Ht 61.0 in | Wt 152.4 lb

## 2024-01-09 DIAGNOSIS — R5383 Other fatigue: Secondary | ICD-10-CM | POA: Insufficient documentation

## 2024-01-09 DIAGNOSIS — Z1231 Encounter for screening mammogram for malignant neoplasm of breast: Secondary | ICD-10-CM

## 2024-01-09 DIAGNOSIS — Z13220 Encounter for screening for lipoid disorders: Secondary | ICD-10-CM

## 2024-01-09 DIAGNOSIS — Z0130 Encounter for examination of blood pressure without abnormal findings: Secondary | ICD-10-CM

## 2024-01-09 DIAGNOSIS — Z131 Encounter for screening for diabetes mellitus: Secondary | ICD-10-CM

## 2024-01-09 DIAGNOSIS — Z1329 Encounter for screening for other suspected endocrine disorder: Secondary | ICD-10-CM | POA: Diagnosis not present

## 2024-01-09 DIAGNOSIS — Z1283 Encounter for screening for malignant neoplasm of skin: Secondary | ICD-10-CM | POA: Insufficient documentation

## 2024-01-09 NOTE — Progress Notes (Signed)
Established Patient Office Visit  Subjective:  Patient ID: Megan Kramer, female    DOB: 01-16-1991  Age: 33 y.o. MRN: [id number]  Chief Complaint  Patient presents with   Acute Visit    Spot on left ear and wanting mammogram    Patient in office for regular follow up. Patient has multiple complaints today.  Patient requesting a mammogram. States she has not felt a lump and is not having any problems. Patient states her maternal grandmother had breast cancer. Order for mammogram sent.  Patient states she has a spot on her ear that is getting larger, will refer to dermatology.  Patient has vague complaints of arm and elbow pain. States she recently learned she has a family history of fibromyalgia. Will order blood work today. Patient due for fasting blood work also, will do today.     No other concerns at this time.   Past Medical History:  Diagnosis Date   Anxiety    Asthma    EXERCISE INDUCED-NO INHALER NOW   Chronic cholecystitis without calculus    Depression    Dysrhythmia    TACHYCARDIA WITH 1ST PREGNANCY-ECHO DONE AND WAS WNL-ASYMPTOMATIC NOW   Family history of adverse reaction to anesthesia    MOM- N/V   Genital herpes    GERD (gastroesophageal reflux disease)    H/O WITH PREGNANCY   Heart murmur    AS A CHILD-ASYMPTOMATIC   History of kidney stones    Hyperlipidemia    Hypertension    pregnancy induced   IBS (irritable bowel syndrome)    Kidney stones 05/09/2017   Obesity (BMI 30-39.9)    Postpartum depression associated with first pregnancy     Past Surgical History:  Procedure Laterality Date   CESAREAN SECTION  12/21/2014   Teresa Coombs, [place]   CESAREAN SECTION N/A 05/27/2017   Procedure: CESAREAN SECTION;  Surgeon: Nadara Mustard, MD;  Location: [facility];  Service: Obstetrics;  Laterality: N/A;   CESAREAN SECTION WITH BILATERAL TUBAL LIGATION Bilateral 11/13/2022   Procedure: REPEAT CESAREAN SECTION WITH BILATERAL TUBAL LIGATION;  Surgeon:  Christeen Douglas, MD;  Location: [facility];  Service: Obstetrics;  Laterality: Bilateral;   CHOLECYSTECTOMY N/A 05/15/2018   Procedure: LAPAROSCOPIC CHOLECYSTECTOMY WITH INTRAOPERATIVE CHOLANGIOGRAM;  Surgeon: Earline Mayotte, MD;  Location: [facility];  Service: General;  Laterality: N/A;   CYSTOSCOPY/URETEROSCOPY/HOLMIUM LASER/STENT PLACEMENT Right 02/27/2018   Procedure: CYSTOSCOPY/URETEROSCOPY/HOLMIUM LASER/STENT PLACEMENT;  Surgeon: Riki Altes, MD;  Location: [facility];  Service: Urology;  Laterality: Right;   OTHER SURGICAL HISTORY     mole bx    WISDOM TOOTH EXTRACTION      Social History   Socioeconomic History   Marital status: Significant Other    Spouse name: Not on file   Number of children: 2   Years of education: 70   Highest education level: Not on file  Occupational History   Occupation: Sales executive  Tobacco Use   Smoking status: Never   Smokeless tobacco: Never  Vaping Use   Vaping status: Never Used  Substance and Sexual Activity   Alcohol use: Never    Comment: occasion   Drug use: No   Sexual activity: Yes    Partners: Male    Birth control/protection: Surgical    Comment: BTL  Other Topics Concern   Not on file  Social History Narrative   Married    2 Retail buyer    Social Drivers of Health  Financial Resource Strain: Not on file  Food Insecurity: Food Insecurity Present (11/13/2022)   Hunger Vital Sign    Worried About Running Out of Food in the Last Year: Sometimes true    Ran Out of Food in the Last Year: Never true  Transportation Needs: No Transportation Needs (11/13/2022)   PRAPARE - Administrator, Civil Service (Medical): No    Lack of Transportation (Non-Medical): No  Physical Activity: Not on file  Stress: Not on file  Social Connections: Not on file  Intimate Partner Violence: Not At Risk (11/13/2022)   Humiliation, Afraid, Rape, and Kick questionnaire    Fear of Current or Ex-Partner: No     Emotionally Abused: No    Physically Abused: No    Sexually Abused: No    Family History  Problem Relation Age of Onset   Hypertension Mother    Depression Mother    Hyperlipidemia Mother    Diverticulosis Mother    Hearing loss Father    Hypertension Father    Irritable bowel syndrome Father    Hyperlipidemia Father    Breast cancer Maternal Grandmother    Miscarriages / Stillbirths Maternal Grandmother    Diabetes Maternal Grandfather    Heart disease Maternal Grandfather        MI   Arthritis Paternal Grandfather    Irritable bowel syndrome Paternal Aunt    GER disease Paternal Aunt    Crohn's disease Paternal Aunt    Diabetes Other     Allergies  Allergen Reactions   Codeine Nausea Only and Nausea And Vomiting   Shellfish Allergy Hives    hives   Latex Rash    Outpatient Medications Prior to Visit  Medication Sig   PARoxetine (PAXIL) 10 MG tablet Take 1 tablet (10 mg total) by mouth daily.   [DISCONTINUED] ondansetron (ZOFRAN-ODT) 4 MG disintegrating tablet Take 1 tablet (4 mg total) by mouth every 8 (eight) hours as needed for nausea. (Patient not taking: Reported on 01/09/2024)   No facility-administered medications prior to visit.    Review of Systems  Constitutional: Negative.   HENT: Negative.    Eyes: Negative.   Respiratory: Negative.  Negative for shortness of breath.   Cardiovascular: Negative.  Negative for chest pain.  Gastrointestinal: Negative.  Negative for abdominal pain, constipation and diarrhea.  Genitourinary: Negative.   Musculoskeletal:  Negative for joint pain and myalgias.  Skin: Negative.   Neurological: Negative.  Negative for dizziness and headaches.  Endo/Heme/Allergies: Negative.   All other systems reviewed and are negative.      Objective:   BP 110/70   Pulse 62   Ht 5\' 1"  (1.549 m)   Wt 152 lb 6.4 oz (69.1 kg)   SpO2 95%   BMI 28.80 kg/m   Vitals:   01/09/24 1144  BP: 110/70  Pulse: 62  Height: 5\' 1"  (1.549 m)   Weight: 152 lb 6.4 oz (69.1 kg)  SpO2: 95%  BMI (Calculated): 28.81    Physical Exam Vitals and nursing note reviewed.  Constitutional:      Appearance: Normal appearance. She is normal weight.  HENT:     Head: Normocephalic and atraumatic.     Nose: Nose normal.     Mouth/Throat:     Mouth: Mucous membranes are moist.  Eyes:     Extraocular Movements: Extraocular movements intact.     Conjunctiva/sclera: Conjunctivae normal.     Pupils: Pupils are equal, round, and reactive to light.  Cardiovascular:  Rate and Rhythm: Normal rate and regular rhythm.     Pulses: Normal pulses.     Heart sounds: Normal heart sounds.  Pulmonary:     Effort: Pulmonary effort is normal.     Breath sounds: Normal breath sounds.  Abdominal:     General: Abdomen is flat. Bowel sounds are normal.     Palpations: Abdomen is soft.  Musculoskeletal:        General: Normal range of motion.     Cervical back: Normal range of motion.  Skin:    General: Skin is warm and dry.  Neurological:     General: No focal deficit present.     Mental Status: She is alert and oriented to person, place, and time.  Psychiatric:        Mood and Affect: Mood normal.        Behavior: Behavior normal.        Thought Content: Thought content normal.        Judgment: Judgment normal.      No results found for any visits on 01/09/24.  No results found for this or any previous visit (from the past 2160 hours).    Assessment & Plan:  Order for mammogram placed. Referral sent to dermatology. Fasting blood work today.  Problem List Items Addressed This Visit       Other   Other fatigue   Relevant Orders   CBC with Differential/Platelet   ANA   Rheumatoid (RA) Factor   CRP (C-Reactive Protein)   Skin cancer screening - Primary   Relevant Orders   Ambulatory referral to Dermatology   Other Visit Diagnoses       Thyroid disorder screening       Relevant Orders   TSH     Diabetes mellitus screening        Relevant Orders   CMP14+EGFR   Hemoglobin A1c     Lipid screening       Relevant Orders   Lipid Profile       Return in about 4 weeks (around 02/06/2024).   Total time spent: 25 minutes  [employer], NP  01/09/2024   This document may have been prepared by [employer] Voice Recognition software and as such may include unintentional dictation errors.

## 2024-01-10 LAB — CMP14+EGFR
ALT: 8 [IU]/L (ref 0–32)
AST: 18 [IU]/L (ref 0–40)
Albumin: 4.4 g/dL (ref 3.9–4.9)
Alkaline Phosphatase: 116 [IU]/L (ref 44–121)
BUN/Creatinine Ratio: 14 (ref 9–23)
BUN: 10 mg/dL (ref 6–20)
Bilirubin Total: 0.4 mg/dL (ref 0.0–1.2)
CO2: 22 mmol/L (ref 20–29)
Calcium: 9.2 mg/dL (ref 8.7–10.2)
Chloride: 101 mmol/L (ref 96–106)
Creatinine, Ser: 0.74 mg/dL (ref 0.57–1.00)
Globulin, Total: 2.6 g/dL (ref 1.5–4.5)
Glucose: 78 mg/dL (ref 70–99)
Potassium: 4.4 mmol/L (ref 3.5–5.2)
Sodium: 137 mmol/L (ref 134–144)
Total Protein: 7 g/dL (ref 6.0–8.5)
eGFR: 109 mL/min/{1.73_m2} (ref >59)

## 2024-01-10 LAB — CBC WITH DIFFERENTIAL/PLATELET
Basophils Absolute: 0 10*3/uL (ref 0.0–0.2)
Basos: 0 %
EOS (ABSOLUTE): 0 10*3/uL (ref 0.0–0.4)
Eos: 0 %
Hematocrit: 39.4 % (ref 34.0–46.6)
Hemoglobin: 12.9 g/dL (ref 11.1–15.9)
Immature Grans (Abs): 0 10*3/uL (ref 0.0–0.1)
Immature Granulocytes: 0 %
Lymphocytes Absolute: 2.2 10*3/uL (ref 0.7–3.1)
Lymphs: 32 %
MCH: 29 pg (ref 26.6–33.0)
MCHC: 32.7 g/dL (ref 31.5–35.7)
MCV: 89 fL (ref 79–97)
Monocytes Absolute: 0.5 10*3/uL (ref 0.1–0.9)
Monocytes: 7 %
Neutrophils Absolute: 4.3 10*3/uL (ref 1.4–7.0)
Neutrophils: 61 %
Platelets: 362 10*3/uL (ref 150–450)
RBC: 4.45 x10E6/uL (ref 3.77–5.28)
RDW: 13.6 % (ref 11.7–15.4)
WBC: 7.1 10*3/uL (ref 3.4–10.8)

## 2024-01-10 LAB — LIPID PANEL
Chol/HDL Ratio: 3 {ratio} (ref 0.0–4.4)
Cholesterol, Total: 173 mg/dL (ref 100–199)
HDL: 57 mg/dL (ref >39)
LDL Chol Calc (NIH): 103 mg/dL — ABNORMAL HIGH (ref 0–99)
Triglycerides: 65 mg/dL (ref 0–149)
VLDL Cholesterol Cal: 13 mg/dL (ref 5–40)

## 2024-01-10 LAB — HEMOGLOBIN A1C
Est. average glucose Bld gHb Est-mCnc: 94 mg/dL
Hgb A1c MFr Bld: 4.9 % (ref 4.8–5.6)

## 2024-01-10 LAB — RHEUMATOID FACTOR: Rheumatoid fact SerPl-aCnc: <10 [IU]/mL (ref <14.0)

## 2024-01-10 LAB — ANA: Anti Nuclear Antibody (ANA): NEGATIVE

## 2024-01-10 LAB — C-REACTIVE PROTEIN: CRP: <1 mg/L (ref 0–10)

## 2024-01-10 LAB — TSH: TSH: 1.2 u[IU]/mL (ref 0.450–4.500)

## 2024-01-14 IMAGING — US US OB < 14 WEEKS - US OB TV
1 series · 13 of 28 positions shown · non-contrast
Comparison: None.

CLINICAL DATA: Vaginal bleeding positive pregnancy test

EXAM:
OBSTETRIC <14 WK US AND TRANSVAGINAL OB US
TECHNIQUE: Both transabdominal and transvaginal ultrasound examinations were
performed for complete evaluation of the gestation as well as the
maternal uterus, adnexal regions, and pelvic cul-de-sac.
Transvaginal technique was performed to assess early pregnancy.

[Series 1: ob us · 13 of 136 slices shown]
[im 6/136]
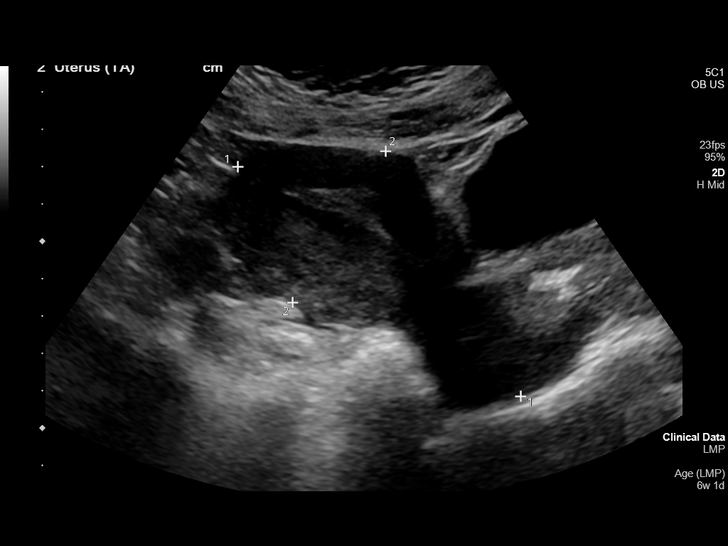
[im 16/136]
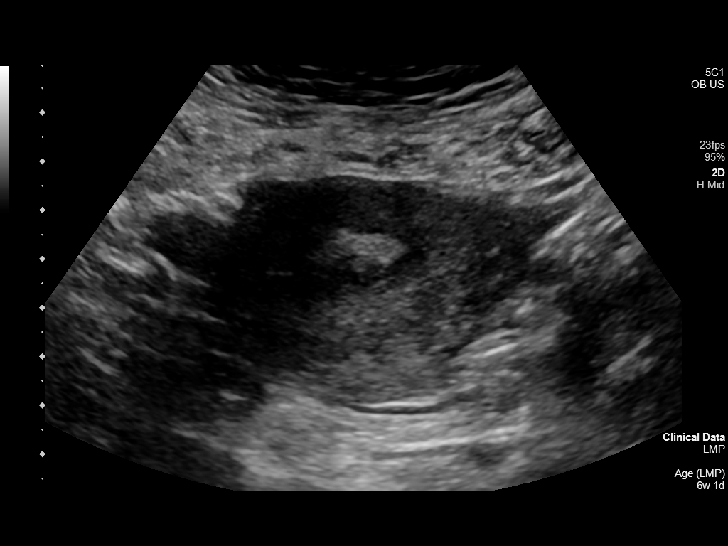
[im 26/136]
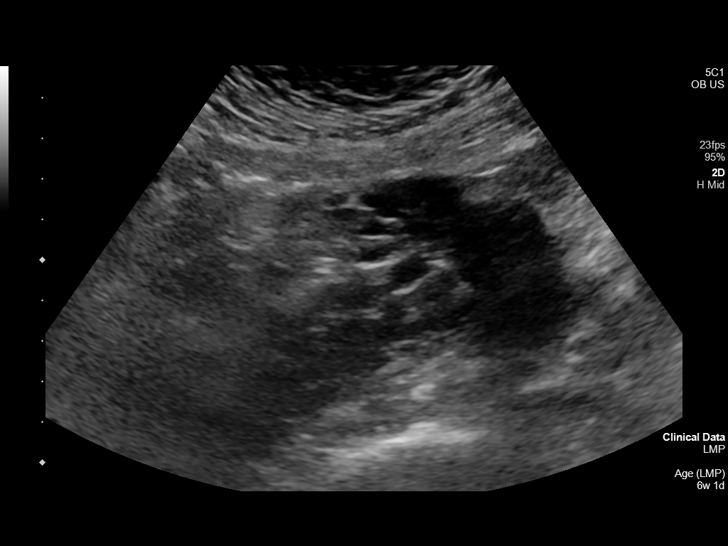
[im 36/136]
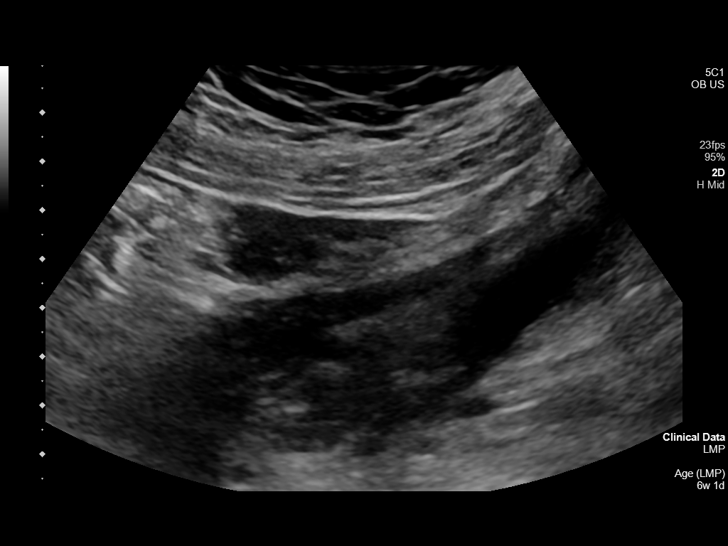
[im 46/136]
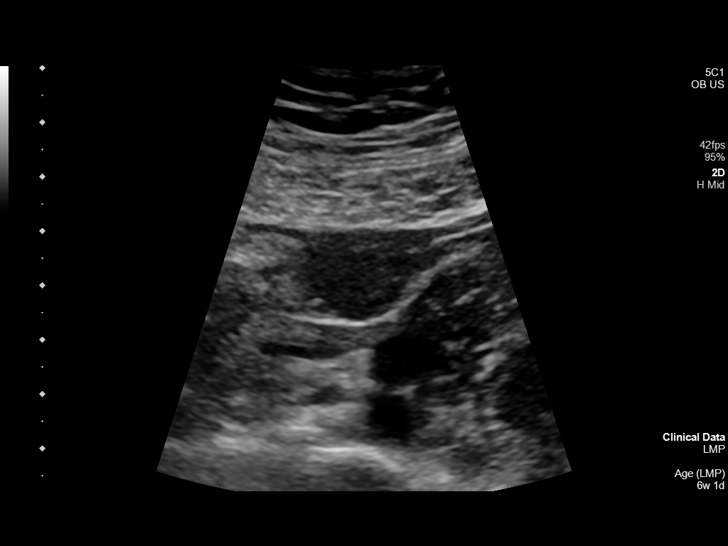
[im 56/136]
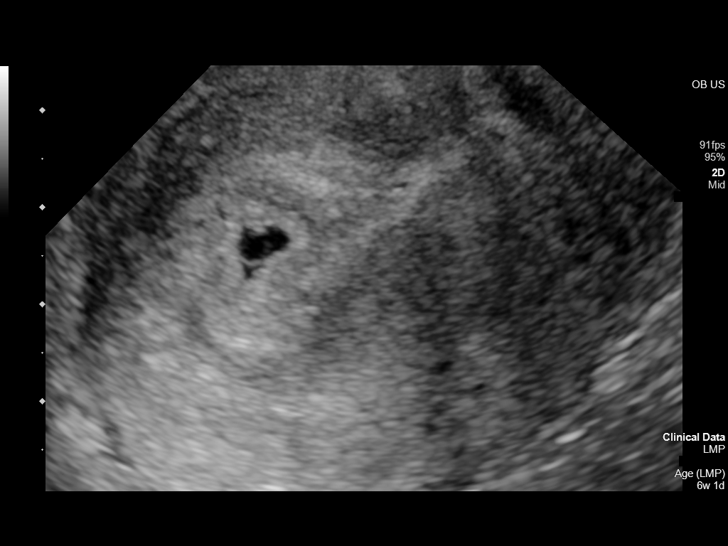
[im 71/136]
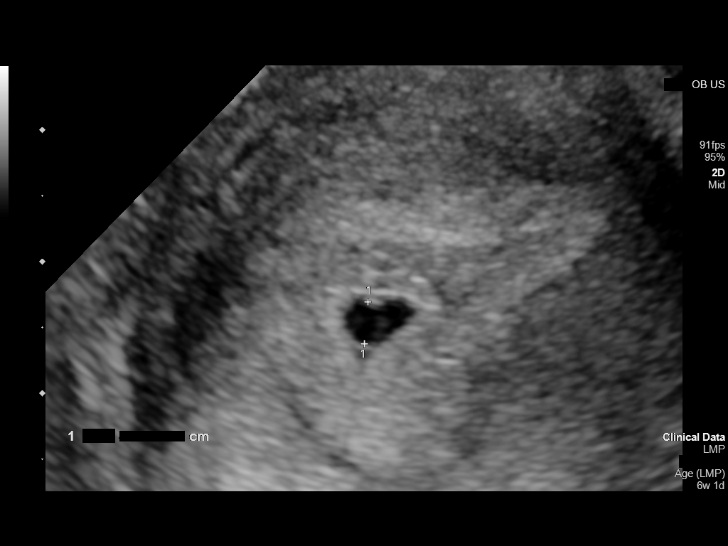
[im 81/136]
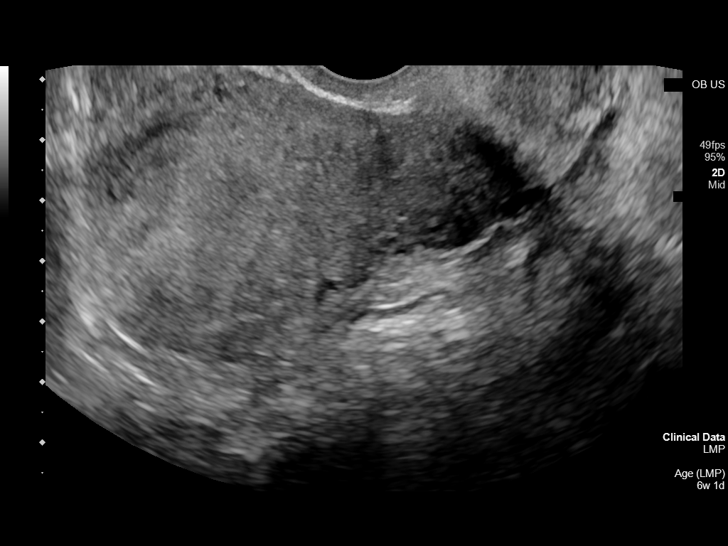
[im 91/136]
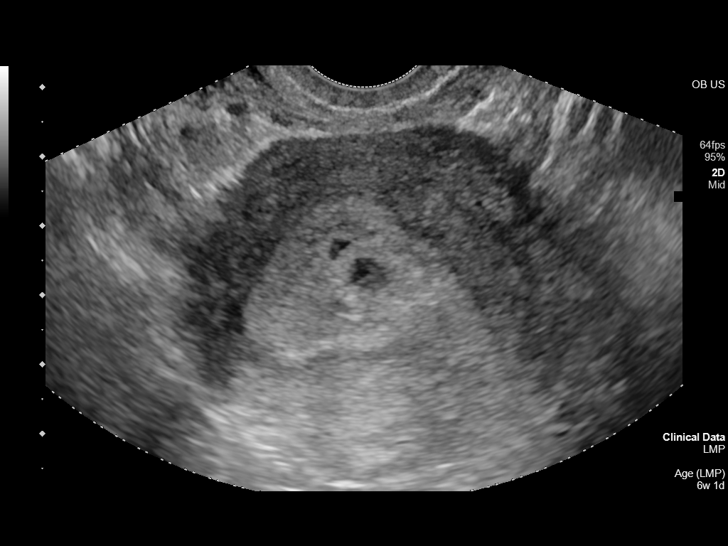
[im 101/136]
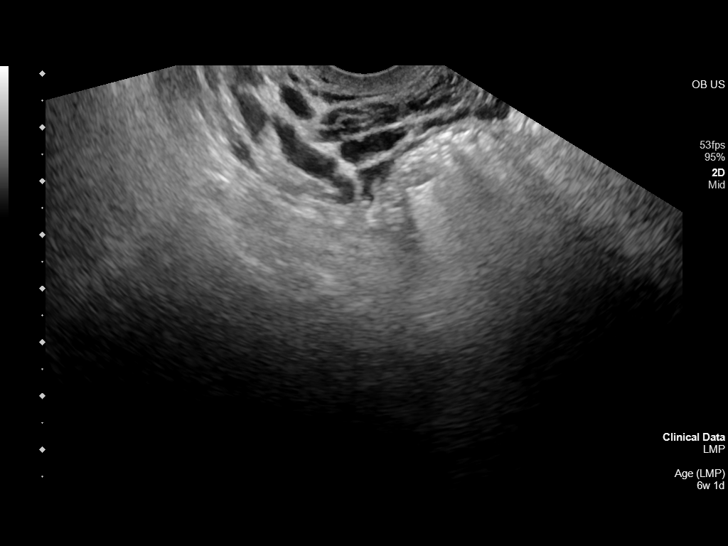
[im 111/136]
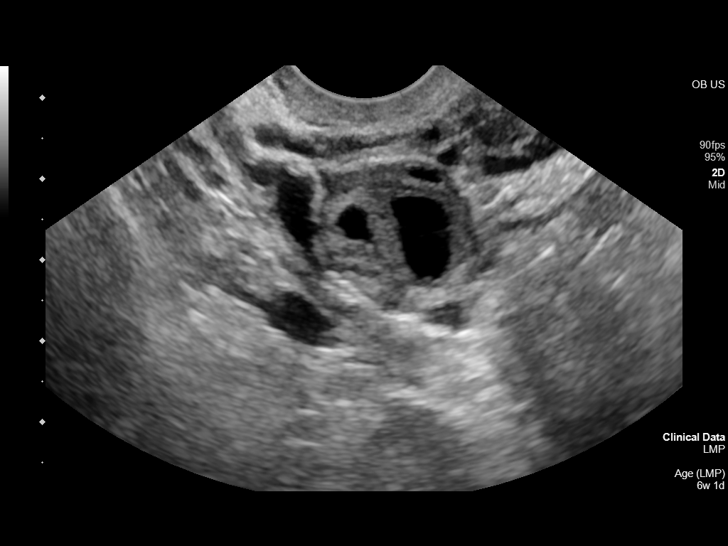
[im 121/136]
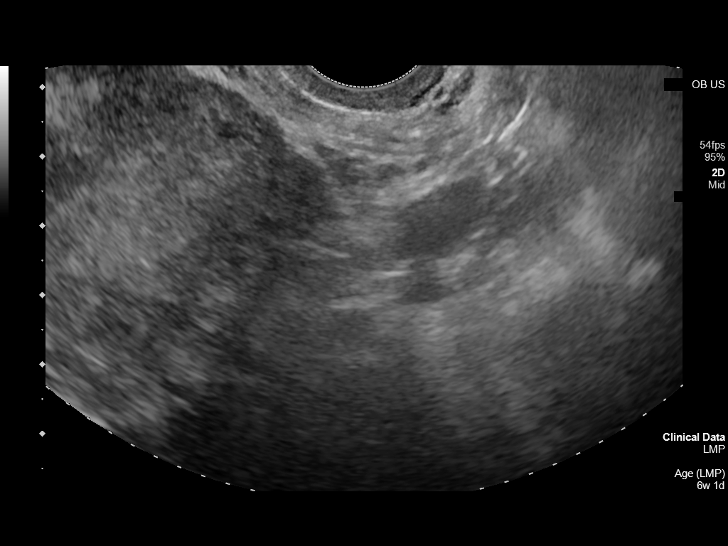
[im 131/136]
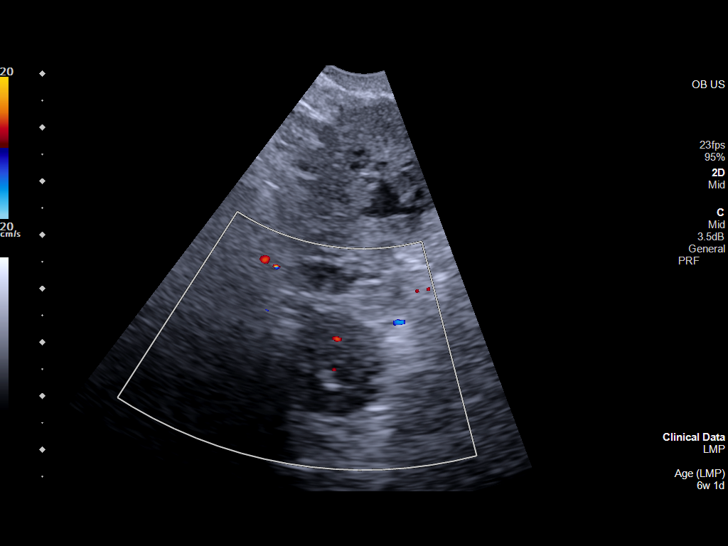

[13 of 28 positions shown; findings below may reference images not displayed]

FINDINGS: Intrauterine gestational sac: Single candidate is visualized

Yolk sac:  There is a possible candidate yolk sac.

Embryo:  Not Visualized.

LMP: February 15, 2022;. Gestational age by LMP is 6 weeks 1 days.
EDC by LMP is 11/22/2022.

MSD: 6.2 mm   5 w   2 d           US EDC: 11/28/2022

Subchorionic hemorrhage: Small

Right ovary: Best visualized transvaginally. There is a ring
structure with internal anechoic fluid which measures 17 x 17 x 15
mm; this appears to arise from the ovary and this likely reflects a
corpus luteal cyst. However no elicitation maneuvers were performed
to demonstrate the structure is part of the ovary. The ovary
measures 2.3 by 3.8 x 2.0 cm for a volume of 9.0 ML.

Left ovary: Measures 3.5 x 1.7 x 2.0 cm for a volume of 6.3 ML.

Other :None

Free fluid:  Trace
IMPRESSION: 1. No fetal pole identified. In the setting of positive pregnancy
test and no definite intrauterine pregnancy, this reflects a
pregnancy of unknown location. Differential considerations include
early normal IUP, abnormal IUP, or nonvisualized ectopic pregnancy.
Differentiation is achieved with serial beta HCG supplemented by
repeat sonography as clinically warranted.
2. Likely RIGHT-sided corpus luteum.

## 2024-01-30 ENCOUNTER — Ambulatory Visit: Payer: BC Managed Care – PPO | Admitting: Internal Medicine

## 2024-02-06 ENCOUNTER — Other Ambulatory Visit: Payer: Self-pay | Admitting: Internal Medicine

## 2024-02-06 ENCOUNTER — Encounter: Payer: Self-pay | Admitting: Internal Medicine

## 2024-02-06 ENCOUNTER — Ambulatory Visit: Payer: BC Managed Care – PPO | Admitting: Internal Medicine

## 2024-02-06 VITALS — BP 160/84 | HR 128 | Ht 61.0 in | Wt 148.8 lb

## 2024-02-06 DIAGNOSIS — Z803 Family history of malignant neoplasm of breast: Secondary | ICD-10-CM

## 2024-02-06 DIAGNOSIS — N6313 Unspecified lump in the right breast, lower outer quadrant: Secondary | ICD-10-CM

## 2024-02-06 DIAGNOSIS — H9392 Unspecified disorder of left ear: Secondary | ICD-10-CM

## 2024-02-06 DIAGNOSIS — Z1389 Encounter for screening for other disorder: Secondary | ICD-10-CM | POA: Diagnosis not present

## 2024-02-06 DIAGNOSIS — R030 Elevated blood-pressure reading, without diagnosis of hypertension: Secondary | ICD-10-CM | POA: Diagnosis not present

## 2024-02-06 DIAGNOSIS — R000 Tachycardia, unspecified: Secondary | ICD-10-CM | POA: Insufficient documentation

## 2024-02-06 DIAGNOSIS — I340 Nonrheumatic mitral (valve) insufficiency: Secondary | ICD-10-CM

## 2024-02-06 DIAGNOSIS — F411 Generalized anxiety disorder: Secondary | ICD-10-CM | POA: Insufficient documentation

## 2024-02-06 LAB — POCT URINALYSIS DIPSTICK
Bilirubin, UA: NEGATIVE
Blood, UA: NEGATIVE
Glucose, UA: NEGATIVE
Ketones, UA: NEGATIVE
Leukocytes, UA: NEGATIVE
Nitrite, UA: NEGATIVE
Protein, UA: NEGATIVE
Spec Grav, UA: 1.01 (ref 1.010–1.025)
Urobilinogen, UA: 0.2 U/dL
pH, UA: 6 (ref 5.0–8.0)

## 2024-02-06 MED ORDER — DULOXETINE HCL 30 MG PO CPEP: 30.0000 mg | ORAL_CAPSULE | Freq: Every day | ORAL | 2 refills | Status: DC

## 2024-02-06 NOTE — Progress Notes (Signed)
 Established Patient Office Visit  Subjective:  Patient ID: Megan Kramer, female    DOB: 29-Oct-1991  Age: 33 y.o. MRN: [id number]  Chief Complaint  Patient presents with   Follow-up    4 week follow up    Patient comes in for her follow-up today.  Her blood pressure and pulse are both high, admits to being very anxious.  Patient reports that whenever she is stressed and anxious her blood pressure and pulse goes up.  Denies any chest pain or shortness of breath.  Her O2 sat is 99% at room air.  Patient had labs done last visit which were discussed today, essentially unremarkable.  She complains of generalized muscle fatigue and soreness especially of her upper back and shoulders and upper arms.  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  Patient advised to start exercise program and stretching exercises.  Patient is also concerned about a small lump which she felt in her right breast.  On exam she has fibrocystic breast disease.  She also has family history of breast cancer in her maternal grandmother.  Will set up a diagnostic mammogram.  Patient is concerned about a lesion on her left ear, needs a dermatology referral to be sent again . Her repeat blood pressure improved to 140/82, she has a history of pregnancy-induced hypertension during her last pregnancy but had preeclampsia with her first 2 pregnancies.  Need to monitor her blood pressure closely. Her EKG shows a sinus arrhythmia at 63 bpm.  Patient has a prescription of Paxil for anxiety but does not take it regularly.  Agrees to try Cymbalta at 30 mg/day.    No other concerns at this time.   Past Medical History:  Diagnosis Date   Anxiety    Asthma    EXERCISE INDUCED-NO INHALER NOW   Chronic cholecystitis without calculus    Depression    Dysrhythmia    TACHYCARDIA WITH 1ST PREGNANCY-ECHO DONE AND WAS WNL-ASYMPTOMATIC NOW   Family history of adverse reaction to anesthesia    MOM-  N/V   Genital herpes    GERD (gastroesophageal reflux disease)    H/O WITH PREGNANCY   Heart murmur    AS A CHILD-ASYMPTOMATIC   History of kidney stones    Hyperlipidemia    Hypertension    pregnancy induced   IBS (irritable bowel syndrome)    Kidney stones 05/09/2017   Obesity (BMI 30-39.9)    Postpartum depression associated with first pregnancy     Past Surgical History:  Procedure Laterality Date   CESAREAN SECTION  12/21/2014   Teresa Coombs, [place]   CESAREAN SECTION N/A 05/27/2017   Procedure: CESAREAN SECTION;  Surgeon: Nadara Mustard, MD;  Location: [facility];  Service: Obstetrics;  Laterality: N/A;   CESAREAN SECTION WITH BILATERAL TUBAL LIGATION Bilateral 11/13/2022   Procedure: REPEAT CESAREAN SECTION WITH BILATERAL TUBAL LIGATION;  Surgeon: Christeen Douglas, MD;  Location: [facility];  Service: Obstetrics;  Laterality: Bilateral;   CHOLECYSTECTOMY N/A 05/15/2018   Procedure: LAPAROSCOPIC CHOLECYSTECTOMY WITH INTRAOPERATIVE CHOLANGIOGRAM;  Surgeon: Earline Mayotte, MD;  Location: [facility];  Service: General;  Laterality: N/A;   CYSTOSCOPY/URETEROSCOPY/HOLMIUM LASER/STENT PLACEMENT Right 02/27/2018   Procedure: CYSTOSCOPY/URETEROSCOPY/HOLMIUM LASER/STENT PLACEMENT;  Surgeon: Riki Altes, MD;  Location: [facility];  Service: Urology;  Laterality: Right;   OTHER SURGICAL HISTORY     mole bx    WISDOM TOOTH EXTRACTION      Social History  Socioeconomic History   Marital status: Significant Other    Spouse name: Not on file   Number of children: 2   Years of education: 70   Highest education level: Not on file  Occupational History   Occupation: Sales executive  Tobacco Use   Smoking status: Never   Smokeless tobacco: Never  Vaping Use   Vaping status: Never Used  Substance and Sexual Activity   Alcohol use: Never    Comment: occasion   Drug use: No   Sexual activity: Yes    Partners: Male    Birth control/protection: Surgical    Comment: BTL  Other Topics  Concern   Not on file  Social History Narrative   Married    2 kids    Sales executive    Social Drivers of Health   Financial Resource Strain: Not on file  Food Insecurity: Food Insecurity Present (11/13/2022)   Hunger Vital Sign    Worried About Running Out of Food in the Last Year: Sometimes true    Ran Out of Food in the Last Year: Never true  Transportation Needs: No Transportation Needs (11/13/2022)   PRAPARE - Administrator, Civil Service (Medical): No    Lack of Transportation (Non-Medical): No  Physical Activity: Not on file  Stress: Not on file  Social Connections: Not on file  Intimate Partner Violence: Not At Risk (11/13/2022)   Humiliation, Afraid, Rape, and Kick questionnaire    Fear of Current or Ex-Partner: No    Emotionally Abused: No    Physically Abused: No    Sexually Abused: No    Family History  Problem Relation Age of Onset   Hypertension Mother    Depression Mother    Hyperlipidemia Mother    Diverticulosis Mother    Hearing loss Father    Hypertension Father    Irritable bowel syndrome Father    Hyperlipidemia Father    Breast cancer Maternal Grandmother    Miscarriages / Stillbirths Maternal Grandmother    Diabetes Maternal Grandfather    Heart disease Maternal Grandfather        MI   Arthritis Paternal Grandfather    Irritable bowel syndrome Paternal Aunt    GER disease Paternal Aunt    Crohn's disease Paternal Aunt    Diabetes Other     Allergies  Allergen Reactions   Codeine Nausea Only and Nausea And Vomiting   Shellfish Allergy Hives    hives   Latex Rash    Outpatient Medications Prior to Visit  Medication Sig   [DISCONTINUED] PARoxetine (PAXIL) 10 MG tablet Take 1 tablet (10 mg total) by mouth daily.   No facility-administered medications prior to visit.    Review of Systems  Constitutional: Negative.  Negative for chills, fever, malaise/fatigue and weight loss.  HENT: Negative.  Negative for sinus pain  and sore throat.   Eyes: Negative.   Respiratory: Negative.  Negative for cough, shortness of breath and stridor.   Cardiovascular: Negative.  Negative for chest pain, palpitations and leg swelling.  Gastrointestinal: Negative.  Negative for abdominal pain, constipation, diarrhea, heartburn, nausea and vomiting.  Genitourinary: Negative.  Negative for dysuria and flank pain.  Musculoskeletal:  Positive for myalgias. Negative for joint pain.  Skin: Negative.   Neurological: Negative.  Negative for dizziness, tingling, tremors, sensory change and headaches.  Endo/Heme/Allergies: Negative.   Psychiatric/Behavioral:  Negative for depression and suicidal ideas. The patient is nervous/anxious.        Objective:  BP (!) 160/84   Pulse (!) 128   Ht 5\' 1"  (1.549 m)   Wt 148 lb 12.8 oz (67.5 kg)   SpO2 99%   BMI 28.12 kg/m   Vitals:   02/06/24 1051  BP: (!) 160/84  Pulse: (!) 128  Height: 5\' 1"  (1.549 m)  Weight: 148 lb 12.8 oz (67.5 kg)  SpO2: 99%  BMI (Calculated): 28.13    Physical Exam Vitals and nursing note reviewed.  Constitutional:      Appearance: Normal appearance.  HENT:     Head: Normocephalic and atraumatic.     Nose: Nose normal.     Mouth/Throat:     Mouth: Mucous membranes are moist.     Pharynx: Oropharynx is clear.  Eyes:     Conjunctiva/sclera: Conjunctivae normal.     Pupils: Pupils are equal, round, and reactive to light.  Cardiovascular:     Rate and Rhythm: Normal rate and regular rhythm.     Pulses: Normal pulses.     Heart sounds: Murmur heard.  Pulmonary:     Effort: Pulmonary effort is normal.     Breath sounds: Normal breath sounds. No wheezing.  Abdominal:     General: Bowel sounds are normal.     Palpations: Abdomen is soft.     Tenderness: There is no abdominal tenderness. There is no right CVA tenderness or left CVA tenderness.  Musculoskeletal:        General: Normal range of motion.     Cervical back: Normal range of motion.      Right lower leg: No edema.     Left lower leg: No edema.  Skin:    General: Skin is warm and dry.  Neurological:     General: No focal deficit present.     Mental Status: She is alert and oriented to person, place, and time.  Psychiatric:        Mood and Affect: Mood normal.        Behavior: Behavior normal.      Results for orders placed or performed in visit on 02/06/24  POCT Urinalysis Dipstick (81002)  Result Value Ref Range   Color, UA yellow    Clarity, UA clear    Glucose, UA Negative Negative   Bilirubin, UA neg    Ketones, UA neg    Spec Grav, UA 1.010 1.010 - 1.025   Blood, UA neg    pH, UA 6.0 5.0 - 8.0   Protein, UA Negative Negative   Urobilinogen, UA 0.2 0.2 or 1.0 E.U./dL   Nitrite, UA neg    Leukocytes, UA Negative Negative   Appearance clear    Odor no     Recent Results (from the past 2160 hours)  CMP14+EGFR     Status: None   Collection Time: 01/09/24 12:06 PM  Result Value Ref Range   Glucose 78 70 - 99 mg/dL   BUN 10 6 - 20 mg/dL   Creatinine, Ser 1.61 0.57 - 1.00 mg/dL   eGFR 096 >04 VW/UJW/1.19   BUN/Creatinine Ratio 14 9 - 23   Sodium 137 134 - 144 mmol/L   Potassium 4.4 3.5 - 5.2 mmol/L   Chloride 101 96 - 106 mmol/L   CO2 22 20 - 29 mmol/L   Calcium 9.2 8.7 - 10.2 mg/dL   Total Protein 7.0 6.0 - 8.5 g/dL   Albumin 4.4 3.9 - 4.9 g/dL   Globulin, Total 2.6 1.5 - 4.5 g/dL   Bilirubin Total 0.4  0.0 - 1.2 mg/dL   Alkaline Phosphatase 116 44 - 121 IU/L   AST 18 0 - 40 IU/L   ALT 8 0 - 32 IU/L  Lipid Profile     Status: Abnormal   Collection Time: 01/09/24 12:06 PM  Result Value Ref Range   Cholesterol, Total 173 100 - 199 mg/dL   Triglycerides 65 0 - 149 mg/dL   HDL 57 >14 mg/dL   VLDL Cholesterol Cal 13 5 - 40 mg/dL   LDL Chol Calc (NIH) 782 (H) 0 - 99 mg/dL   Chol/HDL Ratio 3.0 0.0 - 4.4 ratio    Comment:                                   T. Chol/HDL Ratio                                             Men  Women                                1/2 Avg.Risk  3.4    3.3                                   Avg.Risk  5.0    4.4                                2X Avg.Risk  9.6    7.1                                3X Avg.Risk 23.4   11.0   Hemoglobin A1c     Status: None   Collection Time: 01/09/24 12:06 PM  Result Value Ref Range   Hgb A1c MFr Bld 4.9 4.8 - 5.6 %    Comment:          Prediabetes: 5.7 - 6.4          Diabetes: >6.4          Glycemic control for adults with diabetes: <7.0    Est. average glucose Bld gHb Est-mCnc 94 mg/dL  CBC with Differential/Platelet     Status: None   Collection Time: 01/09/24 12:06 PM  Result Value Ref Range   WBC 7.1 3.4 - 10.8 x10E3/uL   RBC 4.45 3.77 - 5.28 x10E6/uL   Hemoglobin 12.9 11.1 - 15.9 g/dL   Hematocrit 95.6 21.3 - 46.6 %   MCV 89 79 - 97 fL   MCH 29.0 26.6 - 33.0 pg   MCHC 32.7 31.5 - 35.7 g/dL   RDW 08.6 57.8 - 46.9 %   Platelets 362 150 - 450 x10E3/uL   Neutrophils 61 Not Estab. %   Lymphs 32 Not Estab. %   Monocytes 7 Not Estab. %   Eos 0 Not Estab. %   Basos 0 Not Estab. %   Neutrophils Absolute 4.3 1.4 - 7.0 x10E3/uL   Lymphocytes Absolute 2.2 0.7 - 3.1 x10E3/uL   Monocytes Absolute 0.5 0.1 - 0.9 x10E3/uL   EOS (ABSOLUTE) 0.0 0.0 - 0.4  x10E3/uL   Basophils Absolute 0.0 0.0 - 0.2 x10E3/uL   Immature Granulocytes 0 Not Estab. %   Immature Grans (Abs) 0.0 0.0 - 0.1 x10E3/uL  TSH     Status: None   Collection Time: 01/09/24 12:06 PM  Result Value Ref Range   TSH 1.200 0.450 - 4.500 uIU/mL  Rheumatoid factor     Status: None   Collection Time: 01/09/24 12:06 PM  Result Value Ref Range   Rheumatoid fact SerPl-aCnc <10.0 <14.0 IU/mL  ANA     Status: None   Collection Time: 01/09/24 12:06 PM  Result Value Ref Range   Anti Nuclear Antibody (ANA) Negative Negative  C-reactive protein     Status: None   Collection Time: 01/09/24 12:06 PM  Result Value Ref Range   CRP <1 0 - 10 mg/L  POCT Urinalysis Dipstick (82956)     Status: None   Collection Time:  02/06/24 11:32 AM  Result Value Ref Range   Color, UA yellow    Clarity, UA clear    Glucose, UA Negative Negative   Bilirubin, UA neg    Ketones, UA neg    Spec Grav, UA 1.010 1.010 - 1.025   Blood, UA neg    pH, UA 6.0 5.0 - 8.0   Protein, UA Negative Negative   Urobilinogen, UA 0.2 0.2 or 1.0 E.U./dL   Nitrite, UA neg    Leukocytes, UA Negative Negative   Appearance clear    Odor no       Assessment & Plan:  Monitor blood pressure closely.  Schedule echocardiogram. Start Cymbalta at 30 mg/day. Referral sent for mammogram and dermatology. Problem List Items Addressed This Visit     Prehypertension - Primary   Tachycardia   Relevant Orders   EKG 12-Lead   GAD (generalized anxiety disorder)   Relevant Medications   DULoxetine (CYMBALTA) 30 MG capsule   Other Visit Diagnoses       Family history of breast cancer       Relevant Orders   MM Digital Diagnostic Bilat     Lump in lower outer quadrant of right breast       Relevant Orders   MM Digital Diagnostic Bilat     Lesion of left ear       Relevant Orders   Ambulatory referral to Dermatology     Screening for blood or protein in urine       Relevant Orders   POCT Urinalysis Dipstick (21308) (Completed)     Nonrheumatic mitral valve regurgitation       Relevant Orders   PCV ECHOCARDIOGRAM COMPLETE       Return in about 4 weeks (around 03/05/2024).   Total time spent: 30 minutes  Margaretann Loveless, MD  02/06/2024   This document may have been prepared by [facility] Voice Recognition software and as such may include unintentional dictation errors.

## 2024-02-13 ENCOUNTER — Ambulatory Visit: Payer: BC Managed Care – PPO | Admitting: Cardiology

## 2024-02-26 ENCOUNTER — Ambulatory Visit: Payer: BC Managed Care – PPO

## 2024-02-26 DIAGNOSIS — I371 Nonrheumatic pulmonary valve insufficiency: Secondary | ICD-10-CM

## 2024-02-26 DIAGNOSIS — I361 Nonrheumatic tricuspid (valve) insufficiency: Secondary | ICD-10-CM | POA: Diagnosis not present

## 2024-02-26 DIAGNOSIS — I340 Nonrheumatic mitral (valve) insufficiency: Secondary | ICD-10-CM | POA: Diagnosis not present

## 2024-03-05 ENCOUNTER — Encounter: Payer: Self-pay | Admitting: Internal Medicine

## 2024-03-05 ENCOUNTER — Ambulatory Visit: Payer: BC Managed Care – PPO | Admitting: Internal Medicine

## 2024-03-05 VITALS — BP 120/84 | HR 63 | Ht 61.0 in | Wt 152.6 lb

## 2024-03-05 DIAGNOSIS — F419 Anxiety disorder, unspecified: Secondary | ICD-10-CM | POA: Diagnosis not present

## 2024-03-05 DIAGNOSIS — F32A Depression, unspecified: Secondary | ICD-10-CM

## 2024-03-05 DIAGNOSIS — E782 Mixed hyperlipidemia: Secondary | ICD-10-CM

## 2024-03-05 MED ORDER — DULOXETINE HCL 60 MG PO CPEP: 60.0000 mg | ORAL_CAPSULE | Freq: Every day | ORAL | 2 refills | Status: AC

## 2024-03-05 NOTE — Progress Notes (Signed)
 Established Patient Office Visit  Subjective:  Patient ID: Megan Kramer, female    DOB: 1991-06-03  Age: 33 y.o. MRN: [id number]  Chief Complaint  Patient presents with   Follow-up    4 week follow up    Patient is here for her follow-up today.  Her blood pressure and pulse are  both within normal limits.  She is taking Cymbalta 30 mg/day and notes that her anxiety and depression are under better control along with improvement in her body aches and pains.  However she notes that the benefit is slowly wearing off, and agrees to increase the dose to 60 mg/day.    No other concerns at this time.   Past Medical History:  Diagnosis Date   Anxiety    Asthma    EXERCISE INDUCED-NO INHALER NOW   Chronic cholecystitis without calculus    Depression    Dysrhythmia    TACHYCARDIA WITH 1ST PREGNANCY-ECHO DONE AND WAS WNL-ASYMPTOMATIC NOW   Family history of adverse reaction to anesthesia    MOM- N/V   Genital herpes    GERD (gastroesophageal reflux disease)    H/O WITH PREGNANCY   Heart murmur    AS A CHILD-ASYMPTOMATIC   History of kidney stones    Hyperlipidemia    Hypertension    pregnancy induced   IBS (irritable bowel syndrome)    Kidney stones 05/09/2017   Obesity (BMI 30-39.9)    Postpartum depression associated with first pregnancy     Past Surgical History:  Procedure Laterality Date   CESAREAN SECTION  12/21/2014   [place], [place]   CESAREAN SECTION N/A 05/27/2017   Procedure: CESAREAN SECTION;  Surgeon: Nadara Mustard, MD;  Location: [facility];  Service: Obstetrics;  Laterality: N/A;   CESAREAN SECTION WITH BILATERAL TUBAL LIGATION Bilateral 11/13/2022   Procedure: REPEAT CESAREAN SECTION WITH BILATERAL TUBAL LIGATION;  Surgeon: Christeen Douglas, MD;  Location: [facility];  Service: Obstetrics;  Laterality: Bilateral;   CHOLECYSTECTOMY N/A 05/15/2018   Procedure: LAPAROSCOPIC CHOLECYSTECTOMY WITH INTRAOPERATIVE CHOLANGIOGRAM;  Surgeon: Earline Mayotte, MD;   Location: [facility];  Service: General;  Laterality: N/A;   CYSTOSCOPY/URETEROSCOPY/HOLMIUM LASER/STENT PLACEMENT Right 02/27/2018   Procedure: CYSTOSCOPY/URETEROSCOPY/HOLMIUM LASER/STENT PLACEMENT;  Surgeon: Riki Altes, MD;  Location: [facility];  Service: Urology;  Laterality: Right;   OTHER SURGICAL HISTORY     mole bx    WISDOM TOOTH EXTRACTION      Social History   Socioeconomic History   Marital status: Significant Other    Spouse name: Not on file   Number of children: 2   Years of education: 55   Highest education level: Not on file  Occupational History   Occupation: Sales executive  Tobacco Use   Smoking status: Never   Smokeless tobacco: Never  Vaping Use   Vaping status: Never Used  Substance and Sexual Activity   Alcohol use: Never    Comment: occasion   Drug use: No   Sexual activity: Yes    Partners: Male    Birth control/protection: Surgical    Comment: BTL  Other Topics Concern   Not on file  Social History Narrative   Married    2 kids    Sales executive    Social Drivers of Health   Financial Resource Strain: Not on file  Food Insecurity: Food Insecurity Present (11/13/2022)   Hunger Vital Sign    Worried About Running Out of Food in the Last Year: Sometimes true  Ran Out of Food in the Last Year: Never true  Transportation Needs: No Transportation Needs (11/13/2022)   PRAPARE - Administrator, Civil Service (Medical): No    Lack of Transportation (Non-Medical): No  Physical Activity: Not on file  Stress: Not on file  Social Connections: Not on file  Intimate Partner Violence: Not At Risk (11/13/2022)   Humiliation, Afraid, Rape, and Kick questionnaire    Fear of Current or Ex-Partner: No    Emotionally Abused: No    Physically Abused: No    Sexually Abused: No    Family History  Problem Relation Age of Onset   Hypertension Mother    Depression Mother    Hyperlipidemia Mother    Diverticulosis Mother    Hearing loss  Father    Hypertension Father    Irritable bowel syndrome Father    Hyperlipidemia Father    Breast cancer Maternal Grandmother    Miscarriages / Stillbirths Maternal Grandmother    Diabetes Maternal Grandfather    Heart disease Maternal Grandfather        MI   Arthritis Paternal Grandfather    Irritable bowel syndrome Paternal Aunt    GER disease Paternal Aunt    Crohn's disease Paternal Aunt    Diabetes Other     Allergies  Allergen Reactions   Codeine Nausea Only and Nausea And Vomiting   Shellfish Allergy Hives    hives   Latex Rash    Outpatient Medications Prior to Visit  Medication Sig   [DISCONTINUED] DULoxetine (CYMBALTA) 30 MG capsule Take 1 capsule (30 mg total) by mouth daily.   No facility-administered medications prior to visit.    Review of Systems  Constitutional: Negative.  Negative for chills, fever, malaise/fatigue and weight loss.  HENT: Negative.  Negative for hearing loss and tinnitus.   Eyes: Negative.   Respiratory: Negative.  Negative for cough and shortness of breath.   Cardiovascular: Negative.  Negative for chest pain, palpitations and leg swelling.  Gastrointestinal: Negative.  Negative for abdominal pain, constipation, diarrhea, heartburn, nausea and vomiting.  Genitourinary: Negative.  Negative for dysuria and flank pain.  Musculoskeletal: Negative.  Negative for joint pain and myalgias.  Skin: Negative.   Neurological: Negative.  Negative for dizziness and headaches.  Endo/Heme/Allergies: Negative.   Psychiatric/Behavioral: Negative.  Negative for depression and suicidal ideas. The patient is not nervous/anxious.        Objective:   BP 120/84   Pulse 63   Ht 5\' 1"  (1.549 m)   Wt 152 lb 9.6 oz (69.2 kg)   SpO2 99%   BMI 28.83 kg/m   Vitals:   03/05/24 1026  BP: 120/84  Pulse: 63  Height: 5\' 1"  (1.549 m)  Weight: 152 lb 9.6 oz (69.2 kg)  SpO2: 99%  BMI (Calculated): 28.85    Physical Exam Vitals and nursing note  reviewed.  Constitutional:      Appearance: Normal appearance.  HENT:     Head: Normocephalic and atraumatic.     Nose: Nose normal.     Mouth/Throat:     Mouth: Mucous membranes are moist.     Pharynx: Oropharynx is clear.  Eyes:     Conjunctiva/sclera: Conjunctivae normal.     Pupils: Pupils are equal, round, and reactive to light.  Cardiovascular:     Rate and Rhythm: Normal rate and regular rhythm.     Pulses: Normal pulses.     Heart sounds: Normal heart sounds. No murmur heard. Pulmonary:  Effort: Pulmonary effort is normal.     Breath sounds: Normal breath sounds. No wheezing.  Abdominal:     General: Bowel sounds are normal.     Palpations: Abdomen is soft.     Tenderness: There is no abdominal tenderness. There is no right CVA tenderness or left CVA tenderness.  Musculoskeletal:        General: Normal range of motion.     Cervical back: Normal range of motion.     Right lower leg: No edema.     Left lower leg: No edema.  Skin:    General: Skin is warm and dry.  Neurological:     General: No focal deficit present.     Mental Status: She is alert and oriented to person, place, and time.  Psychiatric:        Mood and Affect: Mood normal.        Behavior: Behavior normal.      No results found for any visits on 03/05/24.  Recent Results (from the past 2160 hours)  CMP14+EGFR     Status: None   Collection Time: 01/09/24 12:06 PM  Result Value Ref Range   Glucose 78 70 - 99 mg/dL   BUN 10 6 - 20 mg/dL   Creatinine, Ser 5.62 0.57 - 1.00 mg/dL   eGFR 130 >86 VH/QIO/9.62   BUN/Creatinine Ratio 14 9 - 23   Sodium 137 134 - 144 mmol/L   Potassium 4.4 3.5 - 5.2 mmol/L   Chloride 101 96 - 106 mmol/L   CO2 22 20 - 29 mmol/L   Calcium 9.2 8.7 - 10.2 mg/dL   Total Protein 7.0 6.0 - 8.5 g/dL   Albumin 4.4 3.9 - 4.9 g/dL   Globulin, Total 2.6 1.5 - 4.5 g/dL   Bilirubin Total 0.4 0.0 - 1.2 mg/dL   Alkaline Phosphatase 116 44 - 121 IU/L   AST 18 0 - 40 IU/L   ALT  8 0 - 32 IU/L  Lipid Profile     Status: Abnormal   Collection Time: 01/09/24 12:06 PM  Result Value Ref Range   Cholesterol, Total 173 100 - 199 mg/dL   Triglycerides 65 0 - 149 mg/dL   HDL 57 >95 mg/dL   VLDL Cholesterol Cal 13 5 - 40 mg/dL   LDL Chol Calc (NIH) 284 (H) 0 - 99 mg/dL   Chol/HDL Ratio 3.0 0.0 - 4.4 ratio    Comment:                                   T. Chol/HDL Ratio                                             Men  Women                               1/2 Avg.Risk  3.4    3.3                                   Avg.Risk  5.0    4.4  2X Avg.Risk  9.6    7.1                                3X Avg.Risk 23.4   11.0   Hemoglobin A1c     Status: None   Collection Time: 01/09/24 12:06 PM  Result Value Ref Range   Hgb A1c MFr Bld 4.9 4.8 - 5.6 %    Comment:          Prediabetes: 5.7 - 6.4          Diabetes: >6.4          Glycemic control for adults with diabetes: <7.0    Est. average glucose Bld gHb Est-mCnc 94 mg/dL  CBC with Differential/Platelet     Status: None   Collection Time: 01/09/24 12:06 PM  Result Value Ref Range   WBC 7.1 3.4 - 10.8 x10E3/uL   RBC 4.45 3.77 - 5.28 x10E6/uL   Hemoglobin 12.9 11.1 - 15.9 g/dL   Hematocrit 02.7 25.3 - 46.6 %   MCV 89 79 - 97 fL   MCH 29.0 26.6 - 33.0 pg   MCHC 32.7 31.5 - 35.7 g/dL   RDW 66.4 40.3 - 47.4 %   Platelets 362 150 - 450 x10E3/uL   Neutrophils 61 Not Estab. %   Lymphs 32 Not Estab. %   Monocytes 7 Not Estab. %   Eos 0 Not Estab. %   Basos 0 Not Estab. %   Neutrophils Absolute 4.3 1.4 - 7.0 x10E3/uL   Lymphocytes Absolute 2.2 0.7 - 3.1 x10E3/uL   Monocytes Absolute 0.5 0.1 - 0.9 x10E3/uL   EOS (ABSOLUTE) 0.0 0.0 - 0.4 x10E3/uL   Basophils Absolute 0.0 0.0 - 0.2 x10E3/uL   Immature Granulocytes 0 Not Estab. %   Immature Grans (Abs) 0.0 0.0 - 0.1 x10E3/uL  TSH     Status: None   Collection Time: 01/09/24 12:06 PM  Result Value Ref Range   TSH 1.200 0.450 - 4.500 uIU/mL   Rheumatoid factor     Status: None   Collection Time: 01/09/24 12:06 PM  Result Value Ref Range   Rheumatoid fact SerPl-aCnc <10.0 <14.0 IU/mL  ANA     Status: None   Collection Time: 01/09/24 12:06 PM  Result Value Ref Range   Anti Nuclear Antibody (ANA) Negative Negative  C-reactive protein     Status: None   Collection Time: 01/09/24 12:06 PM  Result Value Ref Range   CRP <1 0 - 10 mg/L  POCT Urinalysis Dipstick (25956)     Status: None   Collection Time: 02/06/24 11:32 AM  Result Value Ref Range   Color, UA yellow    Clarity, UA clear    Glucose, UA Negative Negative   Bilirubin, UA neg    Ketones, UA neg    Spec Grav, UA 1.010 1.010 - 1.025   Blood, UA neg    pH, UA 6.0 5.0 - 8.0   Protein, UA Negative Negative   Urobilinogen, UA 0.2 0.2 or 1.0 E.U./dL   Nitrite, UA neg    Leukocytes, UA Negative Negative   Appearance clear    Odor no       Assessment & Plan:  Increase Cymbalta to 60 mg/day.  Avoid salt.  Strict diet control. Problem List Items Addressed This Visit     HLD (hyperlipidemia)   Anxiety and depression - Primary   Relevant Medications   DULoxetine (  CYMBALTA) 60 MG capsule    Return in about 3 months (around 06/05/2024).   Total time spent: 25 minutes  Margaretann Loveless, MD  03/05/2024   This document may have been prepared by [facility] Voice Recognition software and as such may include unintentional dictation errors.

## 2024-03-19 ENCOUNTER — Ambulatory Visit
Admission: RE | Admit: 2024-03-19 | Discharge: 2024-03-19 | Disposition: A | Discharge status: Home / Self Care | Source: Ambulatory Visit | Attending: Internal Medicine | Admitting: Internal Medicine

## 2024-03-19 ENCOUNTER — Encounter: Payer: Self-pay | Admitting: Radiology

## 2024-03-19 DIAGNOSIS — N6313 Unspecified lump in the right breast, lower outer quadrant: Secondary | ICD-10-CM | POA: Insufficient documentation

## 2024-03-19 DIAGNOSIS — Z803 Family history of malignant neoplasm of breast: Secondary | ICD-10-CM | POA: Diagnosis present

## 2024-06-18 ENCOUNTER — Encounter: Payer: Self-pay | Admitting: Internal Medicine

## 2024-06-18 ENCOUNTER — Ambulatory Visit (INDEPENDENT_AMBULATORY_CARE_PROVIDER_SITE_OTHER): Admitting: Internal Medicine

## 2024-06-18 VITALS — BP 128/76 | HR 88 | Ht 61.0 in | Wt 146.6 lb

## 2024-06-18 DIAGNOSIS — F32A Depression, unspecified: Secondary | ICD-10-CM | POA: Diagnosis not present

## 2024-06-18 DIAGNOSIS — F419 Anxiety disorder, unspecified: Secondary | ICD-10-CM | POA: Diagnosis not present

## 2024-06-18 DIAGNOSIS — F900 Attention-deficit hyperactivity disorder, predominantly inattentive type: Secondary | ICD-10-CM

## 2024-06-18 DIAGNOSIS — Z0130 Encounter for examination of blood pressure without abnormal findings: Secondary | ICD-10-CM

## 2024-06-18 NOTE — Progress Notes (Signed)
 Established Patient Office Visit  Subjective:  Patient ID: Megan Kramer, female    DOB: [birth year]  Age: 33 y.o. MRN: [id number]  Chief Complaint  Patient presents with   Follow-up    3 month follow up    Patient comes in for follow-up today.  She is taking her Cymbalta  regularly and is feeling very well.  Her blood pressure also looks good.  Denies headaches or dizziness, no chest pain and no palpitations.  However she is concerned that she has signs and symptoms of ADHD, which were masked before with anxiety.  She has difficulty completing tasks and her mind starts wandering.  Her daughter recently got diagnosed with ADHD as well.  She scores high on ADHD self reporting symptom checklist.  Will set up an evaluation with the neuropsychologist.    No other concerns at this time.   Past Medical History:  Diagnosis Date   Anxiety    Asthma    EXERCISE INDUCED-NO INHALER NOW   Chronic cholecystitis without calculus    Depression    Dysrhythmia    TACHYCARDIA WITH 1ST PREGNANCY-ECHO DONE AND WAS WNL-ASYMPTOMATIC NOW   Family history of adverse reaction to anesthesia    MOM- N/V   Genital herpes    GERD (gastroesophageal reflux disease)    H/O WITH PREGNANCY   Heart murmur    AS A CHILD-ASYMPTOMATIC   History of kidney stones    Hyperlipidemia    Hypertension    pregnancy induced   IBS (irritable bowel syndrome)    Kidney stones 05/09/2017   Obesity (BMI 30-39.9)    Postpartum depression associated with first pregnancy     Past Surgical History:  Procedure Laterality Date   CESAREAN SECTION  12/21/2014   [place], [place]   CESAREAN SECTION N/A 05/27/2017   Procedure: CESAREAN SECTION;  Surgeon: Arloa Lamar SQUIBB, MD;  Location: [facility];  Service: Obstetrics;  Laterality: N/A;   CESAREAN SECTION WITH BILATERAL TUBAL LIGATION Bilateral 11/13/2022   Procedure: REPEAT CESAREAN SECTION WITH BILATERAL TUBAL LIGATION;  Surgeon: Verdon Keen, MD;  Location: [facility];   Service: Obstetrics;  Laterality: Bilateral;   CHOLECYSTECTOMY N/A 05/15/2018   Procedure: LAPAROSCOPIC CHOLECYSTECTOMY WITH INTRAOPERATIVE CHOLANGIOGRAM;  Surgeon: Dessa Reyes ORN, MD;  Location: [facility];  Service: General;  Laterality: N/A;   CYSTOSCOPY/URETEROSCOPY/HOLMIUM LASER/STENT PLACEMENT Right 02/27/2018   Procedure: CYSTOSCOPY/URETEROSCOPY/HOLMIUM LASER/STENT PLACEMENT;  Surgeon: Twylla Glendia BROCKS, MD;  Location: [facility];  Service: Urology;  Laterality: Right;   OTHER SURGICAL HISTORY     mole bx    WISDOM TOOTH EXTRACTION      Social History   Socioeconomic History   Marital status: Significant Other    Spouse name: Not on file   Number of children: 2   Years of education: 42   Highest education level: Not on file  Occupational History   Occupation: Sales executive  Tobacco Use   Smoking status: Never   Smokeless tobacco: Never  Vaping Use   Vaping status: Never Used  Substance and Sexual Activity   Alcohol use: Never    Comment: occasion   Drug use: No   Sexual activity: Yes    Partners: Male    Birth control/protection: Surgical    Comment: BTL  Other Topics Concern   Not on file  Social History Narrative   Married    2 Retail buyer    Social Drivers of Health   Financial Resource Strain: Not on file  Food Insecurity: Food Insecurity Present (11/13/2022)   Hunger Vital Sign    Worried About Running Out of Food in the Last Year: Sometimes true    Ran Out of Food in the Last Year: Never true  Transportation Needs: No Transportation Needs (11/13/2022)   PRAPARE - Administrator, Civil Service (Medical): No    Lack of Transportation (Non-Medical): No  Physical Activity: Not on file  Stress: Not on file  Social Connections: Not on file  Intimate Partner Violence: Not At Risk (11/13/2022)   Humiliation, Afraid, Rape, and Kick questionnaire    Fear of Current or Ex-Partner: No    Emotionally Abused: No    Physically Abused: No     Sexually Abused: No    Family History  Problem Relation Age of Onset   Hypertension Mother    Depression Mother    Hyperlipidemia Mother    Diverticulosis Mother    Hearing loss Father    Hypertension Father    Irritable bowel syndrome Father    Hyperlipidemia Father    Breast cancer Maternal Grandmother    Miscarriages / Stillbirths Maternal Grandmother    Diabetes Maternal Grandfather    Heart disease Maternal Grandfather        MI   Arthritis Paternal Grandfather    Irritable bowel syndrome Paternal Aunt    GER disease Paternal Aunt    Crohn's disease Paternal Aunt    Diabetes Other     Allergies  Allergen Reactions   Codeine Nausea Only and Nausea And Vomiting   Shellfish Allergy Hives    hives   Latex Rash    Outpatient Medications Prior to Visit  Medication Sig   DULoxetine  (CYMBALTA ) 60 MG capsule Take 1 capsule (60 mg total) by mouth daily.   No facility-administered medications prior to visit.    Review of Systems  Constitutional: Negative.  Negative for chills, diaphoresis, fever and malaise/fatigue.  HENT: Negative.  Negative for sore throat.   Eyes: Negative.   Respiratory: Negative.  Negative for cough and shortness of breath.   Cardiovascular: Negative.  Negative for chest pain, palpitations and leg swelling.  Gastrointestinal: Negative.  Negative for abdominal pain, constipation, diarrhea, heartburn, nausea and vomiting.  Genitourinary: Negative.  Negative for dysuria and flank pain.  Musculoskeletal: Negative.  Negative for joint pain and myalgias.  Skin: Negative.   Neurological: Negative.  Negative for dizziness, tingling, tremors and headaches.  Endo/Heme/Allergies: Negative.   Psychiatric/Behavioral: Negative.  Negative for depression and suicidal ideas. The patient is not nervous/anxious.        Objective:   BP 128/76   Pulse 88   Ht 5' 1 (1.549 m)   Wt 146 lb 9.6 oz (66.5 kg)   SpO2 99%   BMI 27.70 kg/m   Vitals:    06/18/24 0909  BP: 128/76  Pulse: 88  Height: 5' 1 (1.549 m)  Weight: 146 lb 9.6 oz (66.5 kg)  SpO2: 99%  BMI (Calculated): 27.71    Physical Exam Vitals and nursing note reviewed.  Constitutional:      Appearance: Normal appearance.  HENT:     Head: Normocephalic and atraumatic.     Nose: Nose normal.     Mouth/Throat:     Mouth: Mucous membranes are moist.     Pharynx: Oropharynx is clear.   Eyes:     Conjunctiva/sclera: Conjunctivae normal.     Pupils: Pupils are equal, round, and reactive to light.    Cardiovascular:  Rate and Rhythm: Normal rate and regular rhythm.     Pulses: Normal pulses.     Heart sounds: Normal heart sounds. No murmur heard. Pulmonary:     Effort: Pulmonary effort is normal.     Breath sounds: Normal breath sounds. No wheezing.  Abdominal:     General: Bowel sounds are normal.     Palpations: Abdomen is soft.     Tenderness: There is no abdominal tenderness. There is no right CVA tenderness or left CVA tenderness.   Musculoskeletal:        General: Normal range of motion.     Cervical back: Normal range of motion.     Right lower leg: No edema.     Left lower leg: No edema.   Skin:    General: Skin is warm and dry.   Neurological:     General: No focal deficit present.     Mental Status: She is alert and oriented to person, place, and time.   Psychiatric:        Mood and Affect: Mood normal.        Behavior: Behavior normal.      No results found for any visits on 06/18/24.  No results found for this or any previous visit (from the past 2160 hours).    Assessment & Plan:  Patient to continue current medications.  Monitor blood pressure.  Set up for ADHD evaluation and treatment. Problem List Items Addressed This Visit     Anxiety and depression   Other Visit Diagnoses       Attention deficit hyperactivity disorder (ADHD), predominantly inattentive type    -  Primary   Relevant Orders   Ambulatory referral to  Neuropsychology       Return in about 4 months (around 10/18/2024).   Total time spent: 30 minutes  FERNAND FREDY RAMAN, MD  06/18/2024   This document may have been prepared by [facility] Voice Recognition software and as such may include unintentional dictation errors.

## 2024-06-30 ENCOUNTER — Encounter: Payer: Self-pay | Admitting: Psychology

## 2024-08-18 ENCOUNTER — Encounter: Attending: Psychology | Admitting: Psychology

## 2024-10-22 ENCOUNTER — Ambulatory Visit: Admitting: Internal Medicine

## 2024-12-29 ENCOUNTER — Ambulatory Visit: Admitting: Family

## 2024-12-29 ENCOUNTER — Encounter: Payer: Self-pay | Admitting: Family

## 2024-12-29 VITALS — BP 140/82 | HR 88 | Ht 61.0 in | Wt 146.8 lb

## 2024-12-29 DIAGNOSIS — F900 Attention-deficit hyperactivity disorder, predominantly inattentive type: Secondary | ICD-10-CM

## 2024-12-29 DIAGNOSIS — Z79899 Other long term (current) drug therapy: Secondary | ICD-10-CM

## 2024-12-29 DIAGNOSIS — E538 Deficiency of other specified B group vitamins: Secondary | ICD-10-CM

## 2024-12-29 DIAGNOSIS — Z1159 Encounter for screening for other viral diseases: Secondary | ICD-10-CM

## 2024-12-29 DIAGNOSIS — E782 Mixed hyperlipidemia: Secondary | ICD-10-CM

## 2024-12-29 DIAGNOSIS — N946 Dysmenorrhea, unspecified: Secondary | ICD-10-CM

## 2024-12-29 DIAGNOSIS — E559 Vitamin D deficiency, unspecified: Secondary | ICD-10-CM

## 2024-12-29 DIAGNOSIS — R5383 Other fatigue: Secondary | ICD-10-CM

## 2024-12-29 MED ORDER — VYVANSE 40 MG PO CAPS: 40.0000 mg | ORAL_CAPSULE | ORAL | 0 refills | Status: AC

## 2024-12-29 NOTE — Progress Notes (Unsigned)
 "  Established Patient Office Visit  Subjective:  Patient ID: Megan Kramer, female    DOB: 27-Aug-1991  Age: 34 y.o. MRN: [id number]  Chief Complaint  Patient presents with   Follow-up    Discuss medications     Patient is here today for follow up/discuss ADHD treatment.   She reports that her mind has been all over the place, that it takes forever to get motivated to do tasks, very easily overstimulated, issues with emotional regulation, struggled with school, was diagnosed as a child with short term memory issues.  Easily distracted. Poor time mgmt.  No anxiety meds have ever helped, has tried multiple meds Mood swings are common, esp. Later in the day.   Starting to affect her every day, feeling burnt out.  Daughter was recently diagnosed as well.   Also having severely painful periods since her 3rd child, says the back pain is so intense she can sometimes not stand up straight and that she thought her ovary was going to explode with her most recent cycle.   Due for her cycle at any time, so she is at the end of her current.    No other concerns at this time.   Past Medical History:  Diagnosis Date   Anxiety    Asthma    EXERCISE INDUCED-NO INHALER NOW   Chronic cholecystitis without calculus    Depression    Dysrhythmia    TACHYCARDIA WITH 1ST PREGNANCY-ECHO DONE AND WAS WNL-ASYMPTOMATIC NOW   Family history of adverse reaction to anesthesia    MOM- N/V   Genital herpes    GERD (gastroesophageal reflux disease)    H/O WITH PREGNANCY   Heart murmur    AS A CHILD-ASYMPTOMATIC   History of kidney stones    Hyperlipidemia    Hypertension    pregnancy induced   IBS (irritable bowel syndrome)    Kidney stones 05/09/2017   Obesity (BMI 30-39.9)    Postpartum depression associated with first pregnancy     Past Surgical History:  Procedure Laterality Date   CESAREAN SECTION  12/21/2014   [place], [place]   CESAREAN SECTION N/A 05/27/2017   Procedure:  CESAREAN SECTION;  Surgeon: Arloa Lamar SQUIBB, MD;  Location: [facility];  Service: Obstetrics;  Laterality: N/A;   CESAREAN SECTION WITH BILATERAL TUBAL LIGATION Bilateral 11/13/2022   Procedure: REPEAT CESAREAN SECTION WITH BILATERAL TUBAL LIGATION;  Surgeon: Verdon Keen, MD;  Location: [facility];  Service: Obstetrics;  Laterality: Bilateral;   CHOLECYSTECTOMY N/A 05/15/2018   Procedure: LAPAROSCOPIC CHOLECYSTECTOMY WITH INTRAOPERATIVE CHOLANGIOGRAM;  Surgeon: Dessa Reyes ORN, MD;  Location: [facility];  Service: General;  Laterality: N/A;   CYSTOSCOPY/URETEROSCOPY/HOLMIUM LASER/STENT PLACEMENT Right 02/27/2018   Procedure: CYSTOSCOPY/URETEROSCOPY/HOLMIUM LASER/STENT PLACEMENT;  Surgeon: Twylla Glendia BROCKS, MD;  Location: [facility];  Service: Urology;  Laterality: Right;   OTHER SURGICAL HISTORY     mole bx    WISDOM TOOTH EXTRACTION      Social History   Socioeconomic History   Marital status: Significant Other    Spouse name: Not on file   Number of children: 2   Years of education: 29   Highest education level: Not on file  Occupational History   Occupation: sales executive  Tobacco Use   Smoking status: Never   Smokeless tobacco: Never  Vaping Use   Vaping status: Never Used  Substance and Sexual Activity   Alcohol use: Never    Comment: occasion   Drug use: No   Sexual  activity: Yes    Partners: Male    Birth control/protection: Surgical    Comment: BTL  Other Topics Concern   Not on file  Social History Narrative   Married    2 Retail Buyer    Social Drivers of Health   Tobacco Use: Low Risk (12/29/2024)   Patient History    Smoking Tobacco Use: Never    Smokeless Tobacco Use: Never    Passive Exposure: Not on file  Financial Resource Strain: Not on file  Food Insecurity: Food Insecurity Present (11/13/2022)   Hunger Vital Sign    Worried About Running Out of Food in the Last Year: Sometimes true    Ran Out of Food in the Last Year: Never true   Transportation Needs: No Transportation Needs (11/13/2022)   PRAPARE - Administrator, Civil Service (Medical): No    Lack of Transportation (Non-Medical): No  Physical Activity: Not on file  Stress: Not on file  Social Connections: Not on file  Intimate Partner Violence: Not At Risk (11/13/2022)   Humiliation, Afraid, Rape, and Kick questionnaire    Fear of Current or Ex-Partner: No    Emotionally Abused: No    Physically Abused: No    Sexually Abused: No  Depression (PHQ2-9): Low Risk (06/18/2024)   Depression (PHQ2-9)    PHQ-2 Score: 4  Alcohol Screen: Not on file  Housing: Low Risk (11/13/2022)   Housing    Last Housing Risk Score: 0  Utilities: Not At Risk (11/13/2022)   AHC Utilities    Threatened with loss of utilities: No  Health Literacy: Not on file    Family History  Problem Relation Age of Onset   Hypertension Mother    Depression Mother    Hyperlipidemia Mother    Diverticulosis Mother    Hearing loss Father    Hypertension Father    Irritable bowel syndrome Father    Hyperlipidemia Father    Breast cancer Maternal Grandmother    Miscarriages / Stillbirths Maternal Grandmother    Diabetes Maternal Grandfather    Heart disease Maternal Grandfather        MI   Arthritis Paternal Grandfather    Irritable bowel syndrome Paternal Aunt    GER disease Paternal Aunt    Crohn's disease Paternal Aunt    Diabetes Other     Allergies[1]  Review of Systems  All other systems reviewed and are negative.      Objective:   BP (!) 140/82   Pulse 88   Ht 5' 1 (1.549 m)   Wt 146 lb 12.8 oz (66.6 kg)   SpO2 99%   BMI 27.74 kg/m   Vitals:   12/29/24 1313  BP: (!) 140/82  Pulse: 88  Height: 5' 1 (1.549 m)  Weight: 146 lb 12.8 oz (66.6 kg)  SpO2: 99%  BMI (Calculated): 27.75    Physical Exam Vitals and nursing note reviewed.  Constitutional:      Appearance: Normal appearance. She is normal weight.  HENT:     Head: Normocephalic.   Eyes:     Extraocular Movements: Extraocular movements intact.     Conjunctiva/sclera: Conjunctivae normal.     Pupils: Pupils are equal, round, and reactive to light.  Cardiovascular:     Rate and Rhythm: Normal rate.  Pulmonary:     Effort: Pulmonary effort is normal.  Neurological:     General: No focal deficit present.     Mental Status: She  is alert and oriented to person, place, and time. Mental status is at baseline.  Psychiatric:        Mood and Affect: Mood normal.        Behavior: Behavior normal.        Thought Content: Thought content normal.        Judgment: Judgment normal.      No results found for any visits on 12/29/24.  No results found for this or any previous visit (from the past 2160 hours).     Assessment & Plan Attention deficit hyperactivity disorder (ADHD), predominantly inattentive type     No follow-ups on file.   Total time spent: 20 minutes  ALAN CHRISTELLA ARRANT, FNP  12/29/2024   This document may have been prepared by [facility] Voice Recognition software and as such may include unintentional dictation errors.     [1]  Allergies Allergen Reactions   Codeine Nausea Only and Nausea And Vomiting   Shellfish Allergy Hives    hives   Latex Rash   "

## 2024-12-29 NOTE — Patient Instructions (Addendum)
 Megan Kramer

## 2024-12-30 LAB — CBC WITH DIFFERENTIAL/PLATELET
Basophils Absolute: 0 x10E3/uL (ref 0.0–0.2)
Basos: 0 %
EOS (ABSOLUTE): 0 x10E3/uL (ref 0.0–0.4)
Eos: 0 %
Hematocrit: 41.5 % (ref 34.0–46.6)
Hemoglobin: 13.2 g/dL (ref 11.1–15.9)
Immature Grans (Abs): 0 x10E3/uL (ref 0.0–0.1)
Immature Granulocytes: 0 %
Lymphocytes Absolute: 3.4 x10E3/uL — ABNORMAL HIGH (ref 0.7–3.1)
Lymphs: 35 %
MCH: 27.7 pg (ref 26.6–33.0)
MCHC: 31.8 g/dL (ref 31.5–35.7)
MCV: 87 fL (ref 79–97)
Monocytes Absolute: 0.7 x10E3/uL (ref 0.1–0.9)
Monocytes: 7 %
Neutrophils Absolute: 5.7 x10E3/uL (ref 1.4–7.0)
Neutrophils: 58 %
Platelets: 436 x10E3/uL (ref 150–450)
RBC: 4.76 x10E6/uL (ref 3.77–5.28)
RDW: 14.4 % (ref 11.7–15.4)
WBC: 9.9 x10E3/uL (ref 3.4–10.8)

## 2024-12-30 LAB — LIPID PANEL
Chol/HDL Ratio: 3.4 ratio (ref 0.0–4.4)
Cholesterol, Total: 192 mg/dL (ref 100–199)
HDL: 57 mg/dL
LDL Chol Calc (NIH): 116 mg/dL — ABNORMAL HIGH (ref 0–99)
Triglycerides: 106 mg/dL (ref 0–149)
VLDL Cholesterol Cal: 19 mg/dL (ref 5–40)

## 2024-12-30 LAB — CMP14+EGFR
ALT: 9 IU/L (ref 0–32)
AST: 19 IU/L (ref 0–40)
Albumin: 4.9 g/dL (ref 3.9–4.9)
Alkaline Phosphatase: 116 IU/L (ref 41–116)
BUN/Creatinine Ratio: 11 (ref 9–23)
BUN: 8 mg/dL (ref 6–20)
Bilirubin Total: 0.4 mg/dL (ref 0.0–1.2)
CO2: 21 mmol/L (ref 20–29)
Calcium: 9.6 mg/dL (ref 8.7–10.2)
Chloride: 99 mmol/L (ref 96–106)
Creatinine, Ser: 0.71 mg/dL (ref 0.57–1.00)
Globulin, Total: 2.8 g/dL (ref 1.5–4.5)
Glucose: 94 mg/dL (ref 70–99)
Potassium: 3.9 mmol/L (ref 3.5–5.2)
Sodium: 138 mmol/L (ref 134–144)
Total Protein: 7.7 g/dL (ref 6.0–8.5)
eGFR: 114 mL/min/1.73

## 2024-12-30 LAB — FSH+PROG+E2+SHBG
Estradiol: 64.8 pg/mL
FSH: 3.6 m[IU]/mL
Progesterone: 3.9 ng/mL
Sex Hormone Binding: 20 nmol/L — ABNORMAL LOW (ref 24.6–122.0)

## 2024-12-30 LAB — LUTEINIZING HORMONE: LH: 7.9 m[IU]/mL

## 2024-12-30 LAB — HCV AB W REFLEX TO QUANT PCR: HCV Ab: NONREACTIVE

## 2024-12-30 LAB — TSH: TSH: 2.81 u[IU]/mL (ref 0.450–4.500)

## 2024-12-30 LAB — VITAMIN D 25 HYDROXY (VIT D DEFICIENCY, FRACTURES): Vit D, 25-Hydroxy: 29.1 ng/mL — ABNORMAL LOW (ref 30.0–100.0)

## 2024-12-30 LAB — VITAMIN B12: Vitamin B-12: 543 pg/mL (ref 232–1245)

## 2024-12-30 LAB — HEMOGLOBIN A1C
Est. average glucose Bld gHb Est-mCnc: 100 mg/dL
Hgb A1c MFr Bld: 5.1 % (ref 4.8–5.6)

## 2024-12-30 LAB — HCV INTERPRETATION

## 2024-12-31 LAB — TOXASSURE SELECT 13 (MW), URINE

## 2025-01-06 ENCOUNTER — Ambulatory Visit: Payer: Self-pay

## 2025-01-11 ENCOUNTER — Emergency Department (HOSPITAL_COMMUNITY)

## 2025-01-11 ENCOUNTER — Encounter (HOSPITAL_COMMUNITY): Payer: Self-pay

## 2025-01-11 ENCOUNTER — Other Ambulatory Visit: Payer: Self-pay

## 2025-01-11 ENCOUNTER — Emergency Department (HOSPITAL_COMMUNITY)
Admission: EM | Admit: 2025-01-11 | Discharge: 2025-01-11 | Disposition: A | Discharge status: Home / Self Care | Attending: Emergency Medicine | Admitting: Emergency Medicine

## 2025-01-11 DIAGNOSIS — S39012A Strain of muscle, fascia and tendon of lower back, initial encounter: Secondary | ICD-10-CM | POA: Diagnosis not present

## 2025-01-11 DIAGNOSIS — M545 Low back pain, unspecified: Secondary | ICD-10-CM | POA: Diagnosis present

## 2025-01-11 DIAGNOSIS — Z91040 Latex allergy status: Secondary | ICD-10-CM | POA: Diagnosis not present

## 2025-01-11 DIAGNOSIS — X509XXA Other and unspecified overexertion or strenuous movements or postures, initial encounter: Secondary | ICD-10-CM | POA: Diagnosis not present

## 2025-01-11 LAB — CBC WITH DIFFERENTIAL/PLATELET
Abs Immature Granulocytes: 0.01 K/uL (ref 0.00–0.07)
Basophils Absolute: 0 K/uL (ref 0.0–0.1)
Basophils Relative: 0 %
Eosinophils Absolute: 0 K/uL (ref 0.0–0.5)
Eosinophils Relative: 0 %
HCT: 38.6 % (ref 36.0–46.0)
Hemoglobin: 12.8 g/dL (ref 12.0–15.0)
Immature Granulocytes: 0 %
Lymphocytes Relative: 20 %
Lymphs Abs: 1.6 K/uL (ref 0.7–4.0)
MCH: 28.1 pg (ref 26.0–34.0)
MCHC: 33.2 g/dL (ref 30.0–36.0)
MCV: 84.8 fL (ref 80.0–100.0)
Monocytes Absolute: 0.5 K/uL (ref 0.1–1.0)
Monocytes Relative: 7 %
Neutro Abs: 5.9 K/uL (ref 1.7–7.7)
Neutrophils Relative %: 73 %
Platelets: 428 K/uL — ABNORMAL HIGH (ref 150–400)
RBC: 4.55 MIL/uL (ref 3.87–5.11)
RDW: 13.5 % (ref 11.5–15.5)
WBC: 8 K/uL (ref 4.0–10.5)
nRBC: 0 % (ref 0.0–0.2)

## 2025-01-11 LAB — BASIC METABOLIC PANEL WITH GFR
Anion gap: 13 (ref 5–15)
BUN: 9 mg/dL (ref 6–20)
CO2: 23 mmol/L (ref 22–32)
Calcium: 9 mg/dL (ref 8.9–10.3)
Chloride: 103 mmol/L (ref 98–111)
Creatinine, Ser: 0.61 mg/dL (ref 0.44–1.00)
GFR, Estimated: >60 mL/min
Glucose, Bld: 88 mg/dL (ref 70–99)
Potassium: 3.6 mmol/L (ref 3.5–5.1)
Sodium: 139 mmol/L (ref 135–145)

## 2025-01-11 LAB — HCG, QUANTITATIVE, PREGNANCY: hCG, Beta Chain, Quant, S: <1 m[IU]/mL

## 2025-01-11 LAB — URINALYSIS, ROUTINE W REFLEX MICROSCOPIC
Bilirubin Urine: NEGATIVE
Glucose, UA: NEGATIVE mg/dL
Hgb urine dipstick: NEGATIVE
Ketones, ur: 20 mg/dL — AB
Leukocytes,Ua: NEGATIVE
Nitrite: NEGATIVE
Protein, ur: 30 mg/dL — AB
Specific Gravity, Urine: 1.031 — ABNORMAL HIGH (ref 1.005–1.030)
pH: 6 (ref 5.0–8.0)

## 2025-01-11 MED ORDER — METHOCARBAMOL 1000 MG/10ML IJ SOLN
500.0000 mg | Freq: Once | INTRAMUSCULAR | Status: AC
  Administered 2025-01-11: 500 mg via INTRAVENOUS
  Filled 2025-01-11: qty 5

## 2025-01-11 MED ORDER — IOHEXOL 300 MG/ML  SOLN
100.0000 mL | Freq: Once | INTRAMUSCULAR | Status: AC | PRN
  Administered 2025-01-11: 100 mL via INTRAVENOUS

## 2025-01-11 MED ORDER — SODIUM CHLORIDE 0.9 % IV BOLUS
1000.0000 mL | Freq: Once | INTRAVENOUS | Status: AC
  Administered 2025-01-11: 1000 mL via INTRAVENOUS

## 2025-01-11 MED ORDER — HYDROMORPHONE HCL 1 MG/ML IJ SOLN
1.0000 mg | Freq: Once | INTRAMUSCULAR | Status: AC
  Administered 2025-01-11: 1 mg via INTRAVENOUS
  Filled 2025-01-11: qty 1

## 2025-01-11 MED ORDER — HYDROCODONE-ACETAMINOPHEN 5-325 MG PO TABS: 1.0000 | ORAL_TABLET | Freq: Four times a day (QID) | ORAL | 0 refills | Status: AC | PRN

## 2025-01-11 MED ORDER — HYDROMORPHONE HCL 1 MG/ML IJ SOLN
0.5000 mg | Freq: Once | INTRAMUSCULAR | Status: AC
  Administered 2025-01-11: 0.5 mg via INTRAVENOUS
  Filled 2025-01-11: qty 0.5

## 2025-01-11 MED ORDER — METHOCARBAMOL 500 MG PO TABS: 500.0000 mg | ORAL_TABLET | Freq: Three times a day (TID) | ORAL | 0 refills | Status: AC | PRN

## 2025-01-11 MED ORDER — KETOROLAC TROMETHAMINE 15 MG/ML IJ SOLN
15.0000 mg | Freq: Once | INTRAMUSCULAR | Status: AC
  Administered 2025-01-11: 15 mg via INTRAVENOUS
  Filled 2025-01-11: qty 1

## 2025-01-11 NOTE — Discharge Instructions (Signed)
 Use ibuprofen  and Tylenol  for pain.  We are also prescribing a muscle relaxer and hydrocodone  for breakthrough pain.  Do not take the hydrocodone  and muscle relaxer at the same time.  Do not combine with other meds, alcohol, or drive/operate heavy machinery while on these medicines.  Follow-up with your primary care provider in regards to your back pain.  If you develop worsening, recurrent, or continued back pain, numbness or weakness in the legs, incontinence of your bowels or bladders, numbness of your buttocks, fever, abdominal pain, or any other new/concerning symptoms then return to the ER for evaluation.

## 2025-01-11 NOTE — ED Triage Notes (Signed)
 Patient BIB [facility] from home for complaint of lower back pain was out rideign four wheelers yesterday, hx of sciatica pain in back. Unable to bared weight this am. EMS vitals  160/82, 70, A&O x4.

## 2025-01-11 NOTE — ED Provider Notes (Signed)
 " [place] EMERGENCY DEPARTMENT AT [facility] Provider Note   CSN: [id number] Arrival date & time: 01/11/25  9281     Patient presents with: Back Pain   Megan Kramer is a 34 y.o. female.   HPI 34 year old female presents with severe low back pain.  Patient states that she has had low back pain for a couple years since her last C-section.  However over the last 2 days she has been riding 4 wheelers a lot with her children and thinks the sitting position has caused a flare of this pain.  She has never had it formally evaluated besides her OB/GYN checking for endometriosis.  The pain is in her low back and on the left side and goes a little bit down her left buttock on the posterior aspect of her leg.  She has not had any fevers, incontinence, or leg weakness.  Her left leg feels a little tingly.  Pain is particularly worse when she moves/rolls over.  The pain was so severe last night that she called EMS this morning.  She dropped down to the ground while trying to go to the bathroom in the middle of the night because of the pain and her husband had to put her back in the bed.  She is also concerned because she states she has urinated only a small amount since 8 PM last night because she has been in so much pain she has not wanted to get up to get anything to drink.   Prior to Admission medications  Medication Sig Start Date End Date Taking? Authorizing Provider  HYDROcodone -acetaminophen  (NORCO/VICODIN) 5-325 MG tablet Take 1 tablet by mouth every 6 (six) hours as needed for severe pain (pain score 7-10). 01/11/25  Yes Freddi Hamilton, MD  methocarbamol  (ROBAXIN ) 500 MG tablet Take 1 tablet (500 mg total) by mouth every 8 (eight) hours as needed for muscle spasms. 01/11/25  Yes Freddi Hamilton, MD  DULoxetine  (CYMBALTA ) 60 MG capsule Take 1 capsule (60 mg total) by mouth daily. Patient not taking: Reported on 12/29/2024 03/05/24 03/05/25  Fernand Fredy RAMAN, MD  VYVANSE  40 MG capsule Take  1 capsule (40 mg total) by mouth every morning. 12/29/24   Orlean Alan HERO, FNP    Allergies: Codeine, Shellfish allergy, and Latex    Review of Systems  Constitutional:  Negative for fever.  Genitourinary:  Negative for dysuria, hematuria and menstrual problem.  Musculoskeletal:  Positive for back pain.  Neurological:  Negative for weakness.    Updated Vital Signs BP (!) 140/72   Pulse 69   Temp (!) 97.5 F (36.4 C) (Oral)   Resp 16   Ht 5' (1.524 m)   Wt 66.2 kg   SpO2 99%   BMI 28.51 kg/m   Physical Exam Vitals and nursing note reviewed.  Constitutional:      Appearance: She is well-developed. She is not ill-appearing or diaphoretic.  HENT:     Head: Normocephalic and atraumatic.  Cardiovascular:     Rate and Rhythm: Normal rate and regular rhythm.     Pulses:          Dorsalis pedis pulses are 2+ on the right side and 2+ on the left side.  Pulmonary:     Effort: Pulmonary effort is normal.     Breath sounds: Normal breath sounds.  Abdominal:     General: There is no distension.     Palpations: Abdomen is soft.     Tenderness: There is  no abdominal tenderness. There is no left CVA tenderness.  Musculoskeletal:     Lumbar back: Tenderness present.       Back:  Skin:    General: Skin is warm and dry.  Neurological:     Mental Status: She is alert.     Comments: 5/5 strength in BLE, grossly normal sensation bilaterally.     (all labs ordered are listed, but only abnormal results are displayed) Labs Reviewed  CBC WITH DIFFERENTIAL/PLATELET - Abnormal; Notable for the following components:      Result Value   Platelets 428 (*)    All other components within normal limits  URINALYSIS, ROUTINE W REFLEX MICROSCOPIC - Abnormal; Notable for the following components:   APPearance HAZY (*)    Specific Gravity, Urine 1.031 (*)    Ketones, ur 20 (*)    Protein, ur 30 (*)    Bacteria, UA RARE (*)    All other components within normal limits  BASIC METABOLIC PANEL  WITH GFR  HCG, QUANTITATIVE, PREGNANCY    EKG: None  Radiology: CT ABDOMEN PELVIS W CONTRAST Result Date: 01/11/2025 EXAM: CT ABDOMEN AND PELVIS WITH CONTRAST 01/11/2025 12:44:21 PM TECHNIQUE: CT of the abdomen and pelvis was performed with the administration of 100 ml Omnipaque  350 contrast. Multiplanar reformatted images are provided for review. Automated exposure control, iterative reconstruction, and/or weight-based adjustment of the mA/kV was utilized to reduce the radiation dose to as low as reasonably achievable. COMPARISON: Paracentesis exam CT abdomen 06/29/2023. CLINICAL HISTORY: back/abd pain back/abd pain back/abd pain back/abd pain FINDINGS: LOWER CHEST: No acute abnormality. LIVER: The liver is unremarkable. GALLBLADDER AND BILE DUCTS: Cholecystectomy. No biliary ductal dilatation. SPLEEN: No acute abnormality. PANCREAS: No acute abnormality. ADRENAL GLANDS: No acute abnormality. KIDNEYS, URETERS AND BLADDER: No stones in the kidneys or ureters. No hydronephrosis. No perinephric or periureteral stranding. Urinary bladder is unremarkable. GI AND BOWEL: Stomach demonstrates no acute abnormality. Esophagus collapsed. No small bowel structural inflammation. Colon is normal. There is no bowel obstruction. PERITONEUM AND RETROPERITONEUM: No ascites. No free air. VASCULATURE: Aorta is normal in caliber. LYMPH NODES: No lymphadenopathy. REPRODUCTIVE ORGANS: Uterus and ovaries are unremarkable. BONES AND SOFT TISSUES: No acute osseous abnormality. No focal soft tissue abnormality. IMPRESSION: 1. No acute findings in the abdomen or pelvis. Electronically signed by: Norleen Boxer MD 01/11/2025 01:40 PM EST RP Workstation: [id number]   DG Lumbar Spine Complete Result Date: 01/11/2025 CLINICAL DATA:  Chronic lower back pain EXAM: LUMBAR SPINE - COMPLETE 4+ VIEW COMPARISON:  June 29, 2023 FINDINGS: There is no evidence of lumbar spine fracture. Alignment is normal. Intervertebral disc spaces are  maintained. IMPRESSION: Negative. Electronically Signed   By: Lynwood Landy Raddle M.D.   On: 01/11/2025 09:34     Procedures   Medications Ordered in the ED  sodium chloride  0.9 % bolus 1,000 mL (0 mLs Intravenous Stopped 01/11/25 0857)  HYDROmorphone  (DILAUDID ) injection 0.5 mg (0.5 mg Intravenous Given 01/11/25 0759)  ketorolac  (TORADOL ) 15 MG/ML injection 15 mg (15 mg Intravenous Given 01/11/25 0759)  methocarbamol  (ROBAXIN ) injection 500 mg (500 mg Intravenous Given 01/11/25 1028)  HYDROmorphone  (DILAUDID ) injection 1 mg (1 mg Intravenous Given 01/11/25 1155)  iohexol  (OMNIPAQUE ) 300 MG/ML solution 100 mL (100 mLs Intravenous Contrast Given 01/11/25 1225)                                    Medical Decision Making Amount and/or  Complexity of Data Reviewed Labs: ordered.    Details: No hematuria Radiology: ordered and independent interpretation performed.    Details: No lumbar fracture  Risk Prescription drug management.   Patient presents with back pain.  Seems to be acute on chronic.  Difficult to control her pain initially though eventually her pain is now well-controlled and she feels well enough for home.  Given the difficulty controlling her pain and some occasional pain going towards her left lower abdomen a CT was obtained but does not show any obvious ovarian cyst/abnormality, ureteral stone, etc.  No neurocomplaints to suggest acute CNS emergency such as cauda equina.  Given she has good pain control now I think she is stable for discharge home with return precautions.  Will treat with short course of pain control and muscle relaxers.     Final diagnoses:  Strain of lumbar region, initial encounter    ED Discharge Orders          Ordered    HYDROcodone -acetaminophen  (NORCO/VICODIN) 5-325 MG tablet  Every 6 hours PRN        01/11/25 1356    methocarbamol  (ROBAXIN ) 500 MG tablet  Every 8 hours PRN        01/11/25 1356               Freddi Hamilton, MD 01/11/25  1509  "

## 2025-01-11 NOTE — ED Notes (Signed)
 Pt gone to CT

## 2025-01-28 ENCOUNTER — Encounter: Payer: Self-pay | Admitting: Family

## 2025-01-28 ENCOUNTER — Ambulatory Visit: Admitting: Family

## 2025-01-28 MED ORDER — VALACYCLOVIR HCL 1 G PO TABS: 1000.0000 mg | ORAL_TABLET | Freq: Three times a day (TID) | ORAL | 0 refills | Status: AC

## 2025-01-28 MED ORDER — GABAPENTIN 100 MG PO CAPS: 100.0000 mg | ORAL_CAPSULE | Freq: Three times a day (TID) | ORAL | 0 refills | Status: AC

## 2025-04-27 ENCOUNTER — Ambulatory Visit: Admitting: Family
